# Patient Record
Sex: Female | Born: 1935
Health system: Southern US, Community
[De-identification: ages and names within clinical notes are randomized; demographics above are authoritative.]

## PROBLEM LIST (undated history)

## (undated) DIAGNOSIS — R9431 Abnormal electrocardiogram [ECG] [EKG]: Secondary | ICD-10-CM

## (undated) DIAGNOSIS — H269 Unspecified cataract: Secondary | ICD-10-CM

## (undated) DIAGNOSIS — R413 Other amnesia: Secondary | ICD-10-CM

## (undated) DIAGNOSIS — E039 Hypothyroidism, unspecified: Secondary | ICD-10-CM

## (undated) DIAGNOSIS — I10 Essential (primary) hypertension: Secondary | ICD-10-CM

## (undated) DIAGNOSIS — R5383 Other fatigue: Secondary | ICD-10-CM

## (undated) DIAGNOSIS — S52021A Displaced fracture of olecranon process without intraarticular extension of right ulna, initial encounter for closed fracture: Secondary | ICD-10-CM

## (undated) DIAGNOSIS — A0472 Enterocolitis due to Clostridium difficile, not specified as recurrent: Secondary | ICD-10-CM

## (undated) DIAGNOSIS — E785 Hyperlipidemia, unspecified: Secondary | ICD-10-CM

## (undated) DIAGNOSIS — J189 Pneumonia, unspecified organism: Secondary | ICD-10-CM

## (undated) DIAGNOSIS — F419 Anxiety disorder, unspecified: Secondary | ICD-10-CM

## (undated) HISTORY — DX: Other fatigue: R53.83

## (undated) HISTORY — DX: Enterocolitis due to Clostridium difficile, not specified as recurrent: A04.72

## (undated) HISTORY — DX: Hypothyroidism, unspecified: E03.9

## (undated) HISTORY — PX: PARATHYROIDECTOMY: SHX19

## (undated) HISTORY — DX: Essential (primary) hypertension: I10

## (undated) HISTORY — DX: Abnormal electrocardiogram (ECG) (EKG): R94.31

## (undated) HISTORY — PX: TONSILLECTOMY: SUR1361

## (undated) HISTORY — PX: THYROIDECTOMY, PARTIAL: SHX18

## (undated) HISTORY — PX: BREAST SURGERY: SHX581

## (undated) HISTORY — DX: Pneumonia, unspecified organism: J18.9

## (undated) HISTORY — DX: Anxiety disorder, unspecified: F41.9

## (undated) HISTORY — DX: Unspecified cataract: H26.9

## (undated) HISTORY — DX: Hyperlipidemia, unspecified: E78.5

## (undated) HISTORY — PX: COLONOSCOPY: SHX174

---

## 1998-01-28 ENCOUNTER — Other Ambulatory Visit: Admission: RE | Admit: 1998-01-28 | Discharge: 1998-01-28 | Payer: Self-pay | Admitting: Obstetrics and Gynecology

## 1999-03-05 ENCOUNTER — Other Ambulatory Visit: Admission: RE | Admit: 1999-03-05 | Discharge: 1999-03-05 | Payer: Self-pay | Admitting: Obstetrics and Gynecology

## 1999-07-22 ENCOUNTER — Ambulatory Visit (HOSPITAL_BASED_OUTPATIENT_CLINIC_OR_DEPARTMENT_OTHER): Admission: RE | Admit: 1999-07-22 | Discharge: 1999-07-22 | Payer: Self-pay

## 1999-07-22 ENCOUNTER — Encounter (INDEPENDENT_AMBULATORY_CARE_PROVIDER_SITE_OTHER): Payer: Self-pay | Admitting: *Deleted

## 2000-03-21 ENCOUNTER — Other Ambulatory Visit: Admission: RE | Admit: 2000-03-21 | Discharge: 2000-03-21 | Payer: Self-pay | Admitting: Obstetrics and Gynecology

## 2001-03-26 ENCOUNTER — Other Ambulatory Visit: Admission: RE | Admit: 2001-03-26 | Discharge: 2001-03-26 | Payer: Self-pay | Admitting: Obstetrics and Gynecology

## 2001-08-10 ENCOUNTER — Encounter: Payer: Self-pay | Admitting: Orthopedic Surgery

## 2001-08-10 ENCOUNTER — Emergency Department (HOSPITAL_COMMUNITY): Admission: EM | Admit: 2001-08-10 | Discharge: 2001-08-10 | Payer: Self-pay | Admitting: *Deleted

## 2002-04-03 ENCOUNTER — Other Ambulatory Visit: Admission: RE | Admit: 2002-04-03 | Discharge: 2002-04-03 | Payer: Self-pay | Admitting: Obstetrics and Gynecology

## 2003-03-14 ENCOUNTER — Other Ambulatory Visit: Admission: RE | Admit: 2003-03-14 | Discharge: 2003-03-14 | Payer: Self-pay | Admitting: Obstetrics and Gynecology

## 2007-01-09 ENCOUNTER — Encounter: Admission: RE | Admit: 2007-01-09 | Discharge: 2007-01-09 | Payer: Self-pay | Admitting: Emergency Medicine

## 2007-01-30 ENCOUNTER — Ambulatory Visit (HOSPITAL_COMMUNITY): Admission: RE | Admit: 2007-01-30 | Discharge: 2007-01-31 | Payer: Self-pay | Admitting: General Surgery

## 2007-01-30 ENCOUNTER — Encounter (INDEPENDENT_AMBULATORY_CARE_PROVIDER_SITE_OTHER): Payer: Self-pay | Admitting: General Surgery

## 2009-01-29 ENCOUNTER — Encounter: Admission: RE | Admit: 2009-01-29 | Discharge: 2009-01-29 | Payer: Self-pay | Admitting: Emergency Medicine

## 2010-03-20 ENCOUNTER — Ambulatory Visit (HOSPITAL_COMMUNITY): Admission: RE | Admit: 2010-03-20 | Discharge: 2010-03-20 | Payer: Self-pay | Admitting: Emergency Medicine

## 2010-10-18 ENCOUNTER — Other Ambulatory Visit: Payer: Self-pay | Admitting: Radiology

## 2010-12-14 NOTE — Op Note (Signed)
NAME:  CHRISTIANNA, BELMONTE NO.:  [id number]   MEDICAL RECORD NO.:  [id number]          PATIENT TYPE:  AMB   LOCATION:  DAY                          FACILITY:  [facility]   PHYSICIAN:  Anselm Pancoast. Weatherly, M.D.DATE OF BIRTH:  March 09, 1924   DATE OF PROCEDURE:  01/30/2007  DATE OF DISCHARGE:                               OPERATIVE REPORT   PREOPERATIVE DIAGNOSIS:  Elevated calcium, primary hyperparathyroidism,  probably left inferior gland.   POSTOPERATIVE DIAGNOSIS:  Elevated calcium, primary hyperparathyroidism,  probably left inferior gland.   OPERATION:  Excision of parathyroid adenoma, left inferior.   ANESTHESIA:  General anesthesia.   SURGEON:  Anselm Pancoast. Zachery Dakins, M.D.   ASSISTANT:  Sandria Bales. Ezzard Standing, M.D.   HISTORY:  Karely Hurtado is an 77 year old female who was referred to  me through the courtesy of Dr. Cleta Alberts, Urgent Care, for an elevated serum  calcium that had been noted several months ago. The patient had had a  previous left thyroid lobectomy for benign disease by Dr. Mosetta Anis  about 20 years ago, and when the calcium was noted to be elevated, the  patient had a repeat serum calcium and her calcium has ranged from about  10.8 to about 11.3, and they then measured a parathyroid hormone level  and it was elevated at a range of approximately 105.  The patient had  had a brain CT and then an MRI because of forgetfulness and not mentally  as sharp as what she considered normal.  She had also been started on  Xanax several months earlier.  She then had a sestamibi scan and on the  sestamibi scan, showed an asymmetrical uptake, predominantly on the  right side but then thought that the left inferior parathyroid was  probably the adenoma.  When I reviewed the report, I could not follow  their logic of why they thought everything was hyperactive on the right  and then switched to the left, but I reviewed the scans with the  radiologist. They were not aware  that she had had a previous left  thyroid lobectomy and that is they saw such asymmetry in the uptake  initially, in that she did not have the left thyroid and then the area  that persists, it appears the most active is the left inferior.  So I  discussed with the patient our plan on removing the adenoma, we would  make a small incision and first try to identify the left inferior, if  this was adenoma, we would stop our surgery at that point, if it was not  an adenoma, we would explore the neck further and hopefully not have to  do a bilateral exploration.  The patient states that she is allergic to  penicillin, did not want to take the Ancef, and I did not give her any  preoperative antibiotics.   DESCRIPTION OF PROCEDURE:  She was positioned on the OR table, induction  of general anesthesia, endotracheal tube, a roll was placed under the  back, and then the neck was prepped Betadine solution and draped in a  sterile manner.  The patient's  little incision was noted and I opened  the left side of the little previous thyroid incision made by Dr. Orpah Greek  20 years earlier, and went straight down through the thin layer of  platysma.  The strap muscles were elevated laterally and we could feel  the trachea midline.  The little area to the left of the trachea was  carefully dissected and it appeared that we were actually a little lower  than, since you could not see manubrium on the scans, and then went  slightly up, identified the recurrent laryngeal nerve on the left, and  then followed this and then found definitely a little adenoma that was  carefully separated from the surrounding tissues and the little adenoma,  after it was removed, measured approximately 10 x 9 x 5 mm in size,  which is definitely enlarged and was examined pathologically by Dr.  Debby Bud is a definitely a parathyroid adenoma.  There were a couple of  little clips placed on the little blood vessels controlling this  adenoma  and then, after removing it, reinspected the nerve, both inferior and  just close to where it was in proximity to the adenoma.  We closed the  strap muscles with a couple sutures of 4-0 Vicryl and then the  subcuticular was closed with 4-0 Monocryl and three 1/2 inch Steri-  Strips on the skin incision.  The patient tolerated the procedure nicely  and was extubated and sent to the recovery room in stable postop  condition.  I will leave it to the patient whether she spends the night  or possibly goes home later this evening.  Her family says that she  would like to try to get off the Xanax and I recommend that if she wants  to take taper that to cut everything by half and Dr. Cleta Alberts will actually  follow her and completely control it.  He is also managing her elevated  blood pressure and her general medical problems.           ______________________________  Anselm Pancoast. Zachery Dakins, M.D.     WJW/MEDQ  D:  01/30/2007  T:  01/30/2007  Job:  [id number]   cc:   Brett Canales A. Cleta Alberts, M.D.  Fax: [phone]

## 2011-02-03 ENCOUNTER — Encounter: Payer: Self-pay | Admitting: Cardiology

## 2011-02-09 ENCOUNTER — Encounter: Payer: Self-pay | Admitting: Cardiology

## 2011-02-10 ENCOUNTER — Ambulatory Visit (INDEPENDENT_AMBULATORY_CARE_PROVIDER_SITE_OTHER): Payer: Medicare Other | Admitting: Cardiology

## 2011-02-10 ENCOUNTER — Encounter: Payer: Self-pay | Admitting: Cardiology

## 2011-02-10 ENCOUNTER — Ambulatory Visit: Payer: Self-pay | Admitting: Cardiology

## 2011-02-10 DIAGNOSIS — R5381 Other malaise: Secondary | ICD-10-CM

## 2011-02-10 DIAGNOSIS — I10 Essential (primary) hypertension: Secondary | ICD-10-CM

## 2011-02-10 DIAGNOSIS — R9431 Abnormal electrocardiogram [ECG] [EKG]: Secondary | ICD-10-CM

## 2011-02-10 DIAGNOSIS — R5383 Other fatigue: Secondary | ICD-10-CM

## 2011-02-10 NOTE — Assessment & Plan Note (Signed)
She reports that she is only recently been told she had hypertension. This is well controlled and she will continue the meds as listed.

## 2011-02-10 NOTE — Assessment & Plan Note (Signed)
She had some labs drawn by Dr. Cleta Alberts the other day.  I will try to get these to review.  Otherwise I will evaluate this as above.

## 2011-02-10 NOTE — Assessment & Plan Note (Signed)
The EKG could be related to ischemia which may be manifesting as fatigue. I will start with an echocardiogram. If this is normal I will plan a stress perfusion study. She would be able to walk on a treadmill

## 2011-02-10 NOTE — Progress Notes (Signed)
HPI The patient presents for evaluation of an abnormal EKG.  She has no prior cardiac history. She has for the last several months or even longer been getting increasing fatigue. She still able to do household chores though she doesn't exercise routinely. She can vacuum. She will fill more exhausted after doing this. She does not describe chest pressure, neck or arm discomfort. She does not report palpitations, presyncope or syncope. She has not had any new shortness of breath, PND or orthopnea. Senna weight change or edema. She was noted recently to have an EKG with anterior T-wave inversions and premature ectopic complexes no previous.  Allergies  Allergen Reactions  . Penicillins     Current Outpatient Prescriptions  Medication Sig Dispense Refill  . AMLODIPINE BESYLATE PO Take by mouth daily.        Marland Kitchen aspirin 81 MG tablet Take 81 mg by mouth daily.        Marland Kitchen atorvastatin (LIPITOR) 10 MG tablet Take 10 mg by mouth daily.        . Calcium Carbonate-Vitamin D (CALTRATE 600+D PO) Take by mouth daily.        . Cholecalciferol (VITAMIN D) 2000 UNITS CAPS Take by mouth daily.        . clonazePAM (KLONOPIN) 0.5 MG tablet Take 0.5 mg by mouth 2 (two) times daily as needed.        Marland Kitchen levothyroxine (SYNTHROID, LEVOTHROID) 50 MCG tablet Take 50 mcg by mouth daily.        . QUEtiapine (SEROQUEL) 25 MG tablet Take 25 mg by mouth at bedtime.          Past Medical History  Diagnosis Date  . Abnormal EKG   . Fatigue   . Hyperlipidemia   . Hypothyroid   . HTN (hypertension)     New    Past Surgical History  Procedure Date  . Thyroidectomy, partial     Family History  Problem Relation Age of Onset  . Arthritis Mother     History   Social History  . Marital Status: Married    Spouse Name: N/A    Number of Children: N/A  . Years of Education: N/A   Occupational History  . Not on file.   Social History Main Topics  . Smoking status: Former Smoker    Quit date: 02/10/1991  .  Smokeless tobacco: Not on file  . Alcohol Use: Not on file  . Drug Use: Not on file  . Sexually Active: Not on file   Other Topics Concern  . Not on file   Social History Narrative  . No narrative on file    ROS:  Positive for joint pains.  Otherwise as stated in the HPI and negative for all other systems.  PHYSICAL EXAM BP 121/69  Pulse 99  Resp 16  Ht 5\' 3"  (1.6 m)  Wt 137 lb (62.143 kg)  BMI 24.27 kg/m2 GENERAL:  Well appearing HEENT:  Pupils equal round and reactive, fundi not visualized, oral mucosa unremarkable NECK:  No jugular venous distention, waveform within normal limits, carotid upstroke brisk and symmetric, no bruits, no thyromegaly LYMPHATICS:  No cervical, inguinal adenopathy LUNGS:  Clear to auscultation bilaterally BACK:  No CVA tenderness CHEST:  Unremarkable HEART:  PMI not displaced or sustained,S1 and S2 within normal limits, no S3, no S4, no clicks, no rubs, no murmurs ABD:  Flat, positive bowel sounds normal in frequency in pitch, no bruits, no rebound, no guarding, no midline pulsatile  mass, no hepatomegaly, no splenomegaly EXT:  2 plus pulses throughout, no edema, no cyanosis no clubbing SKIN:  No rashes no nodules NEURO:  Cranial nerves II through XII grossly intact, motor grossly intact throughout [facility]:  Cognitively intact, oriented to person place and time   EKG:  02/01/11  Sinus rhythm, rate 78, premature ectopic complexes, axis within normal limits, but within normal limits, anterior T-wave inversions new for previous  ASSESSMENT AND PLAN

## 2011-02-10 NOTE — Patient Instructions (Signed)
Your physician has requested that you have an echocardiogram. Echocardiography is a painless test that uses sound waves to create images of your heart. It provides your doctor with information about the size and shape of your heart and how well your heart's chambers and valves are working. This procedure takes approximately one hour. There are no restrictions for this procedure.  You will be called with the results of this test when they are available.  If this test is normal you will be scheduled for a stress test.  Please continue your current medications as listed.  Follow up will be based on the results of your testing.

## 2011-02-16 ENCOUNTER — Ambulatory Visit (HOSPITAL_COMMUNITY): Payer: Medicare Other | Attending: Cardiology | Admitting: Radiology

## 2011-02-16 DIAGNOSIS — I080 Rheumatic disorders of both mitral and aortic valves: Secondary | ICD-10-CM | POA: Insufficient documentation

## 2011-02-16 DIAGNOSIS — E785 Hyperlipidemia, unspecified: Secondary | ICD-10-CM | POA: Insufficient documentation

## 2011-02-16 DIAGNOSIS — I079 Rheumatic tricuspid valve disease, unspecified: Secondary | ICD-10-CM | POA: Insufficient documentation

## 2011-02-16 DIAGNOSIS — I10 Essential (primary) hypertension: Secondary | ICD-10-CM | POA: Insufficient documentation

## 2011-02-16 DIAGNOSIS — R9431 Abnormal electrocardiogram [ECG] [EKG]: Secondary | ICD-10-CM | POA: Insufficient documentation

## 2011-02-17 ENCOUNTER — Encounter (HOSPITAL_COMMUNITY): Payer: Self-pay | Admitting: Emergency Medicine

## 2011-03-03 ENCOUNTER — Other Ambulatory Visit: Payer: Self-pay | Admitting: *Deleted

## 2011-03-03 ENCOUNTER — Telehealth: Payer: Self-pay | Admitting: Cardiology

## 2011-03-03 DIAGNOSIS — R9431 Abnormal electrocardiogram [ECG] [EKG]: Secondary | ICD-10-CM

## 2011-03-03 NOTE — Progress Notes (Unsigned)
Pt ready to schedule myoview.  The order was placed and pt will be contacted to schedule.

## 2011-03-03 NOTE — Telephone Encounter (Signed)
Order placed for lexiscan.  Pt will be called and scheduled.

## 2011-03-03 NOTE — Telephone Encounter (Signed)
Pt had an echo and was told she needed a stress test, but was going to the beach, she is back now, just need order for test

## 2011-03-04 ENCOUNTER — Telehealth: Payer: Self-pay | Admitting: Cardiovascular Disease

## 2011-03-04 ENCOUNTER — Telehealth: Payer: Self-pay | Admitting: Cardiology

## 2011-03-04 NOTE — Telephone Encounter (Signed)
Pt's husband calling re order for stress test, didn't get a call back and wanted to follow up

## 2011-03-04 NOTE — Telephone Encounter (Signed)
Pt's husband calling to follow up with order for stress test, didn't receive a call from yesterday

## 2011-03-04 NOTE — Telephone Encounter (Signed)
Pt scheduled for 8/14 at 9:15 am.  Instructions sheet mailed to pts home address.

## 2011-03-15 ENCOUNTER — Ambulatory Visit (HOSPITAL_COMMUNITY): Payer: Medicare Other | Attending: Cardiology | Admitting: Radiology

## 2011-03-15 VITALS — Ht 60.0 in | Wt 137.0 lb

## 2011-03-15 DIAGNOSIS — I4949 Other premature depolarization: Secondary | ICD-10-CM

## 2011-03-15 DIAGNOSIS — R9431 Abnormal electrocardiogram [ECG] [EKG]: Secondary | ICD-10-CM | POA: Insufficient documentation

## 2011-03-15 MED ORDER — TECHNETIUM TC 99M TETROFOSMIN IV KIT
10.7000 | PACK | Freq: Once | INTRAVENOUS | Status: AC | PRN
  Administered 2011-03-15: 11 via INTRAVENOUS

## 2011-03-15 MED ORDER — TECHNETIUM TC 99M TETROFOSMIN IV KIT
33.0000 | PACK | Freq: Once | INTRAVENOUS | Status: AC | PRN
  Administered 2011-03-15: 33 via INTRAVENOUS

## 2011-03-15 MED ORDER — REGADENOSON 0.4 MG/5ML IV SOLN
0.4000 mg | Freq: Once | INTRAVENOUS | Status: AC
Start: 2011-03-15 — End: 2011-03-15
  Administered 2011-03-15: 0.4 mg via INTRAVENOUS

## 2011-03-15 NOTE — Progress Notes (Addendum)
[facility] SITE 3 NUCLEAR MED [address] [place] [address] [phone]  Cardiology Nuclear Med Study  Brianna Reynolds is a 77 y.o. female [id number] Jul 08, 1924   Nuclear Med Background Indication for Stress Test:  Evaluation for Ischemia and Abnormal EKG History:  02/16/11 Echo:EF=55-60%, Mild AR, MR Cardiac Risk Factors: History of Smoking, Hypertension and Lipids  Symptoms:  Fatigue   Nuclear Pre-Procedure Caffeine/Decaff Intake:  None NPO After: 7:00pm   Lungs:  Clear.  O2 sat 98% on RA. IV 0.9% NS with Angio Cath:  20g  IV Site: R Antecubital  IV Started by:  Stanton Kidney, EMT-P  Chest Size (in):  36 Cup Size: C  Height: 5' (1.524 m)  Weight:  137 lb (62.143 kg)  BMI:  Body mass index is 26.76 kg/(m^2). Tech Comments:  NA    Nuclear Med Study 1 or 2 day study: 1 day  Stress Test Type:  Lexiscan  Reading MD: Charlton Haws, MD  Order Authorizing Provider:  Rollene Rotunda, MD  Resting Radionuclide: Technetium 62m Tetrofosmin  Resting Radionuclide Dose: 10.7 mCi   Stress Radionuclide:  Technetium 15m Tetrofosmin  Stress Radionuclide Dose: 33.0 mCi           Stress Protocol Rest HR: 75 Stress HR: 98  Rest BP: 108/58 Stress BP: 107/60  Exercise Time (min): n/a METS: n/a   Predicted Max HR: 136 bpm % Max HR: 72.06 bpm Rate Pressure Product: 41324   Dose of Adenosine (mg):  n/a Dose of Lexiscan: 0.4 mg  Dose of Atropine (mg): n/a Dose of Dobutamine: n/a mcg/kg/min (at max HR)  Stress Test Technologist: Smiley Houseman, CMA-N  Nuclear Technologist:  Domenic Polite, CNMT     Rest Procedure:  Myocardial perfusion imaging was performed at rest 45 minutes following the intravenous administration of Technetium 72m Tetrofosmin.  Rest ECG: Nonspecific T-wave changes with intermittent LBBB beats.  Stress Procedure:  The patient received IV Lexiscan 0.4 mg over 15-seconds.  Technetium 33m Tetrofosmin injected at 30-seconds.  Patient developed LBBB  with Lexiscan.  Quantitative spect images were obtained after a 45 minute delay.  Stress ECG: No significant change from baseline ECG  QPS Raw Data Images:  Normal; no motion artifact; normal heart/lung ratio. Stress Images:  There is decreased uptake in the anterior wall. Rest Images:  Normal homogeneous uptake in all areas of the myocardium. Subtraction (SDS):  These findings are consistent with ischemia. Transient Ischemic Dilatation (Normal <1.22):  0.96 Lung/Heart Ratio (Normal <0.45):  0.33  Quantitative Gated Spect Images QGS EDV:  68 ml QGS ESV:  29 ml QGS cine images:  NL LV Function; NL Wall Motion QGS EF: 58%  Impression Exercise Capacity:  Lexiscan with no exercise. BP Response:  Normal blood pressure response. Clinical Symptoms:  No chest pain. ECG Impression:  No significant ST segment change suggestive of ischemia. Comparison with Prior Nuclear Study: No previous nuclear study performed  Overall Impression:  Small area of anterior wall ischemia at mid and apical level  Charlton Haws  Discussed with the patient.  Abnormal nuclear.  She will need heart catheterization in the [facility] lab.  We will call to arrange.  Rollene Rotunda

## 2011-03-16 NOTE — Progress Notes (Signed)
nuc med report routed to Dr. Antoine Poche 8/15//12 Brianna Reynolds

## 2011-03-18 ENCOUNTER — Telehealth: Payer: Self-pay | Admitting: *Deleted

## 2011-03-18 ENCOUNTER — Other Ambulatory Visit: Payer: Self-pay | Admitting: *Deleted

## 2011-03-18 ENCOUNTER — Encounter: Payer: Self-pay | Admitting: *Deleted

## 2011-03-18 DIAGNOSIS — Z01810 Encounter for preprocedural cardiovascular examination: Secondary | ICD-10-CM

## 2011-03-18 DIAGNOSIS — I2510 Atherosclerotic heart disease of native coronary artery without angina pectoris: Secondary | ICD-10-CM

## 2011-03-18 NOTE — Progress Notes (Signed)
Pt aware of results and need for cath.  Scheduled for 03/24/2011.  Pt will come in for labs and instructions next week.

## 2011-03-18 NOTE — Telephone Encounter (Signed)
Spoke with pt about her cardiac cath scheduled for Thursday March 24, 2011 in the [facility] lab with Dr Antoine Poche at 11:30.  Pt aware is needs to come into the office to find blood work and to pick up a copy of her instructions and directions.  Instruction were reviewed with the pt on the phone,

## 2011-03-23 ENCOUNTER — Other Ambulatory Visit (INDEPENDENT_AMBULATORY_CARE_PROVIDER_SITE_OTHER): Payer: Medicare Other | Admitting: *Deleted

## 2011-03-23 DIAGNOSIS — Z01810 Encounter for preprocedural cardiovascular examination: Secondary | ICD-10-CM

## 2011-03-23 DIAGNOSIS — I2510 Atherosclerotic heart disease of native coronary artery without angina pectoris: Secondary | ICD-10-CM

## 2011-03-23 LAB — CBC WITH DIFFERENTIAL/PLATELET
Basophils Relative: 0.6 % (ref 0.0–3.0)
Hemoglobin: 14.1 g/dL (ref 12.0–15.0)
Lymphocytes Relative: 19.9 % (ref 12.0–46.0)
Monocytes Relative: 9.2 % (ref 3.0–12.0)
Neutro Abs: 5.7 10*3/uL (ref 1.4–7.7)
RBC: 4.77 Mil/uL (ref 3.87–5.11)

## 2011-03-23 LAB — PROTIME-INR
INR: 1 ratio (ref 0.8–1.0)
Prothrombin Time: 11.6 s (ref 10.2–12.4)

## 2011-03-23 LAB — BASIC METABOLIC PANEL
Chloride: 107 mEq/L (ref 96–112)
Potassium: 3.8 mEq/L (ref 3.5–5.1)

## 2011-03-24 ENCOUNTER — Inpatient Hospital Stay (HOSPITAL_BASED_OUTPATIENT_CLINIC_OR_DEPARTMENT_OTHER)
Admission: RE | Admit: 2011-03-24 | Discharge: 2011-03-24 | Disposition: A | Discharge status: Home / Self Care | Payer: Medicare Other | Source: Ambulatory Visit | Attending: Cardiology | Admitting: Cardiology

## 2011-03-24 DIAGNOSIS — R9431 Abnormal electrocardiogram [ECG] [EKG]: Secondary | ICD-10-CM | POA: Insufficient documentation

## 2011-03-24 DIAGNOSIS — R5381 Other malaise: Secondary | ICD-10-CM | POA: Insufficient documentation

## 2011-03-24 DIAGNOSIS — R9430 Abnormal result of cardiovascular function study, unspecified: Secondary | ICD-10-CM

## 2011-04-01 ENCOUNTER — Encounter: Payer: Self-pay | Admitting: Cardiology

## 2011-05-12 NOTE — Cardiovascular Report (Signed)
  NAME:  Brianna Reynolds, Brianna Reynolds NO.:  [id number]  MEDICAL RECORD NO.:  [id number]  LOCATION:  [id number]                     FACILITY:  [facility]  PHYSICIAN:  Rollene Rotunda, MD, FACCDATE OF BIRTH:  1923-12-24  DATE OF PROCEDURE:  03/24/2011 DATE OF DISCHARGE:  03/15/2011                           CARDIAC CATHETERIZATION   PRIMARY CARE PHYSICIAN:  Brett Canales A. Cleta Alberts, MD  PROCEDURE:  Left heart catheterization/coronary arteriography.  INDICATIONS:  The patient with fatigue, abnormal EKG, and an abnormal stress test suggesting anterior ischemia.  PROCEDURE NOTE:  Left heart catheterization was performed via the right femoral artery.  The artery was cannulated using anterior wall puncture. A #4-French arterial sheath was inserted via the modified Seldinger technique.  A preformed Judkins and a pigtail catheter were utilized. The patient tolerated the procedure well and left the lab in stable condition.  RESULTS:  Hemodynamics:  LV 143/13, AO 143/93.  Coronaries:  Left mainwas normal.  The LAD had diffuse luminal irregularities.  There was mid 30% stenosis.  First diagonal was large and normal.  The circumflex in the AV groove was normal.  Mid obtuse marginal was large and normal. The right coronary artery was large and dominant and normal.  There was long proximal 25% stenosis.  The PDA was moderate-sized and normal. Posterolateral was moderate-sized and normal.  Left ventriculogram: Left ventriculogram was obtained in the RAO projection.  The EF was 60% and normal wall motion.  CONCLUSION:  Minimal coronary plaque.  Normal left ventricular function.  PLAN:  No further cardiac workup is suggested.  The patient will follow with Dr. Cleta Alberts.     Rollene Rotunda, MD, [facility]     JH/MEDQ  D:  03/24/2011  T:  03/24/2011  Job:  [id number]  cc:   Brett Canales A. Cleta Alberts, M.D.  Electronically Signed by Rollene Rotunda MD [facility] on 05/12/2011 01:28:06 PM

## 2011-05-17 LAB — COMPREHENSIVE METABOLIC PANEL
ALT: 9
AST: 16
CO2: 26
Calcium: 10.8 — ABNORMAL HIGH
GFR calc Af Amer: >60
GFR calc non Af Amer: >60
Potassium: 4
Sodium: 141
Total Protein: 7

## 2011-05-17 LAB — DIFFERENTIAL
Eosinophils Absolute: 0
Eosinophils Relative: 0
Lymphs Abs: 1.2
Monocytes Relative: 6

## 2011-05-17 LAB — CBC
MCHC: 33.8
RBC: 4.88
WBC: 9.7

## 2011-08-07 ENCOUNTER — Ambulatory Visit (INDEPENDENT_AMBULATORY_CARE_PROVIDER_SITE_OTHER): Payer: Medicare Other

## 2011-08-07 DIAGNOSIS — E7800 Pure hypercholesterolemia, unspecified: Secondary | ICD-10-CM | POA: Diagnosis not present

## 2011-08-07 DIAGNOSIS — G4700 Insomnia, unspecified: Secondary | ICD-10-CM | POA: Diagnosis not present

## 2011-08-07 DIAGNOSIS — Z79899 Other long term (current) drug therapy: Secondary | ICD-10-CM | POA: Diagnosis not present

## 2011-08-07 DIAGNOSIS — E782 Mixed hyperlipidemia: Secondary | ICD-10-CM | POA: Diagnosis not present

## 2011-08-12 DIAGNOSIS — H43819 Vitreous degeneration, unspecified eye: Secondary | ICD-10-CM | POA: Diagnosis not present

## 2011-08-12 DIAGNOSIS — Z961 Presence of intraocular lens: Secondary | ICD-10-CM | POA: Diagnosis not present

## 2011-08-13 ENCOUNTER — Ambulatory Visit (INDEPENDENT_AMBULATORY_CARE_PROVIDER_SITE_OTHER): Payer: Medicare Other

## 2011-08-13 DIAGNOSIS — G4700 Insomnia, unspecified: Secondary | ICD-10-CM

## 2011-08-13 DIAGNOSIS — F411 Generalized anxiety disorder: Secondary | ICD-10-CM | POA: Diagnosis not present

## 2011-09-21 DIAGNOSIS — Z961 Presence of intraocular lens: Secondary | ICD-10-CM | POA: Diagnosis not present

## 2011-09-21 DIAGNOSIS — G609 Hereditary and idiopathic neuropathy, unspecified: Secondary | ICD-10-CM | POA: Diagnosis not present

## 2011-09-21 DIAGNOSIS — H01009 Unspecified blepharitis unspecified eye, unspecified eyelid: Secondary | ICD-10-CM | POA: Diagnosis not present

## 2011-09-21 DIAGNOSIS — R262 Difficulty in walking, not elsewhere classified: Secondary | ICD-10-CM | POA: Diagnosis not present

## 2011-09-21 DIAGNOSIS — F329 Major depressive disorder, single episode, unspecified: Secondary | ICD-10-CM | POA: Diagnosis not present

## 2011-09-21 DIAGNOSIS — R413 Other amnesia: Secondary | ICD-10-CM | POA: Diagnosis not present

## 2011-09-28 ENCOUNTER — Other Ambulatory Visit: Payer: Self-pay

## 2011-09-28 MED ORDER — QUETIAPINE FUMARATE 25 MG PO TABS: 25.0000 mg | ORAL_TABLET | Freq: Two times a day (BID) | ORAL | Status: DC

## 2011-10-03 DIAGNOSIS — R928 Other abnormal and inconclusive findings on diagnostic imaging of breast: Secondary | ICD-10-CM | POA: Diagnosis not present

## 2011-10-03 DIAGNOSIS — Z09 Encounter for follow-up examination after completed treatment for conditions other than malignant neoplasm: Secondary | ICD-10-CM | POA: Diagnosis not present

## 2011-10-21 ENCOUNTER — Other Ambulatory Visit: Payer: Self-pay | Admitting: Emergency Medicine

## 2011-10-22 NOTE — Telephone Encounter (Signed)
HEATHER FROM [name] STATES THAT THE PT'S HUSBAND IS BEING PUSHY ABOUT THE RX FOR KLONOPIN BEING REFILLED, AND JUST WANTED TO MAKE SURE THAT WE REFILLED IT AS SOON AS POSSIBLE.

## 2011-11-07 DIAGNOSIS — H35369 Drusen (degenerative) of macula, unspecified eye: Secondary | ICD-10-CM | POA: Diagnosis not present

## 2011-11-07 DIAGNOSIS — H04129 Dry eye syndrome of unspecified lacrimal gland: Secondary | ICD-10-CM | POA: Diagnosis not present

## 2011-11-07 DIAGNOSIS — Z961 Presence of intraocular lens: Secondary | ICD-10-CM | POA: Diagnosis not present

## 2011-11-07 DIAGNOSIS — H5310 Unspecified subjective visual disturbances: Secondary | ICD-10-CM | POA: Diagnosis not present

## 2011-11-15 ENCOUNTER — Telehealth: Payer: Self-pay

## 2011-11-15 ENCOUNTER — Other Ambulatory Visit: Payer: Self-pay | Admitting: Physician Assistant

## 2011-11-15 ENCOUNTER — Other Ambulatory Visit: Payer: Self-pay | Admitting: Emergency Medicine

## 2011-11-15 MED ORDER — QUETIAPINE FUMARATE 25 MG PO TABS: ORAL_TABLET | ORAL | Status: DC

## 2011-11-15 MED ORDER — QUETIAPINE FUMARATE 25 MG PO TABS: 25.0000 mg | ORAL_TABLET | Freq: Every day | ORAL | Status: DC

## 2011-11-15 NOTE — Telephone Encounter (Signed)
pts husband IS on her hippa form.

## 2011-11-15 NOTE — Telephone Encounter (Signed)
Pts husband called stating it was important he speak with dr Cleta Alberts today about wife's medication. When asked if it was a dosage question or something different, he stated it was just about one of her medications and needed to speak with dr daub only. Bf  Best: [phone]  bf

## 2011-12-03 ENCOUNTER — Other Ambulatory Visit: Payer: Self-pay | Admitting: Emergency Medicine

## 2012-01-04 ENCOUNTER — Other Ambulatory Visit: Payer: Self-pay | Admitting: Emergency Medicine

## 2012-01-19 ENCOUNTER — Other Ambulatory Visit: Payer: Self-pay | Admitting: Physician Assistant

## 2012-01-20 ENCOUNTER — Other Ambulatory Visit: Payer: Self-pay | Admitting: Emergency Medicine

## 2012-02-07 ENCOUNTER — Other Ambulatory Visit: Payer: Self-pay | Admitting: Physician Assistant

## 2012-02-07 NOTE — Telephone Encounter (Signed)
Needs office visit before runs out 

## 2012-02-27 ENCOUNTER — Ambulatory Visit (INDEPENDENT_AMBULATORY_CARE_PROVIDER_SITE_OTHER): Payer: Medicare Other | Admitting: Internal Medicine

## 2012-02-27 VITALS — BP 108/60 | HR 98 | Temp 98.8°F | Resp 16 | Ht 59.0 in | Wt 144.8 lb

## 2012-02-27 DIAGNOSIS — M791 Myalgia, unspecified site: Secondary | ICD-10-CM

## 2012-02-27 DIAGNOSIS — R82998 Other abnormal findings in urine: Secondary | ICD-10-CM

## 2012-02-27 DIAGNOSIS — IMO0001 Reserved for inherently not codable concepts without codable children: Secondary | ICD-10-CM | POA: Diagnosis not present

## 2012-02-27 DIAGNOSIS — R8281 Pyuria: Secondary | ICD-10-CM

## 2012-02-27 LAB — POCT URINALYSIS DIPSTICK
Bilirubin, UA: NEGATIVE
Glucose, UA: NEGATIVE
Nitrite, UA: NEGATIVE
Spec Grav, UA: 1.025

## 2012-02-27 LAB — POCT UA - MICROSCOPIC ONLY

## 2012-02-27 LAB — POCT CBC
Hemoglobin: 14.9 g/dL (ref 12.2–16.2)
MPV: 9.1 fL (ref 0–99.8)
POC Granulocyte: 7 — AB (ref 2–6.9)
POC MID %: 6.9 %M (ref 0–12)
RBC: 5.15 M/uL (ref 4.04–5.48)

## 2012-02-27 MED ORDER — AMLODIPINE BESYLATE 5 MG PO TABS: 5.0000 mg | ORAL_TABLET | Freq: Every day | ORAL | Status: DC

## 2012-02-27 MED ORDER — CLONAZEPAM 0.5 MG PO TABS: ORAL_TABLET | ORAL | Status: DC

## 2012-02-27 NOTE — Progress Notes (Signed)
Subjective:    Patient ID: Brianna Reynolds, female    DOB: [birth year], 78 y.o.   MRN: [id number]  HPIComplaining of feeling terrible since she woke up today Shaky all over/jumpy inside This is not her typical anxiety and this feels like no medical problems she's ever had before Denies medical symptoms Worried because going to [employer] on Friday hike and doesn't want to be sick Current meds include Seroquel-25 mg-she takes 2 when necessary insomnia She took 2 last night and when they had worked by 4 a.m. She took 2 more  Needs some medication refills she's here= Norvasc for hypertension, Klonopin for anxiety  Review of Systems  Constitutional: Negative for fever, chills, activity change, appetite change and fatigue.  HENT: Negative for congestion, sore throat, trouble swallowing, neck pain and sinus pressure.   Eyes: Negative for photophobia and visual disturbance.  Respiratory: Negative for cough and shortness of breath.   Cardiovascular: Negative for chest pain, palpitations and leg swelling.  Gastrointestinal: Negative for nausea, vomiting, abdominal pain and constipation.  Genitourinary: Negative for frequency and difficulty urinating.  Musculoskeletal: Positive for myalgias. Negative for gait problem.       This is more of an aching feeling but is  nonspecific and not in just a single muscle group  Skin: Negative for rash.       History negative for tic bites  Neurological: Negative for speech difficulty and headaches.  Hematological: Does not bruise/bleed easily.  Psychiatric/Behavioral: Negative for confusion, dysphoric mood and agitation. The patient is not nervous/anxious.        Objective:   Physical Exam Filed Vitals:   02/27/12 1542  BP: 108/60  Pulse: 98  Temp: 98.8 F (37.1 C)  Resp: 16   HEENT clear Heart regular without murmur, rate 80 Lungs clear  abdomen supple Extremities no edema Neurological intact Skin clear Neuromuscular bones and  joints clear        Results for orders placed in visit on 02/27/12  POCT CBC      Component Value Range   WBC 10.1  4.6 - 10.2 K/uL   Lymph, poc 2.4  0.6 - 3.4   POC LYMPH PERCENT 23.6  10 - 50 %L   MID (cbc) 0.7  0 - 0.9   POC MID % 6.9  0 - 12 %M   POC Granulocyte 7.0 (*) 2 - 6.9   Granulocyte percent 69.5  37 - 80 %G   RBC 5.15  4.04 - 5.48 M/uL   Hemoglobin 14.9  12.2 - 16.2 g/dL   HCT, POC 40.9 (*) 81.1 - 47.9 %   MCV 93.7  80 - 97 fL   MCH, POC 28.9  27 - 31.2 pg   MCHC 30.8 (*) 31.8 - 35.4 g/dL   RDW, POC 91.4     Platelet Count, POC 335  142 - 424 K/uL   MPV 9.1  0 - 99.8 fL  POCT UA - MICROSCOPIC ONLY      Component Value Range   WBC, Ur, HPF, POC 3-6     RBC, urine, microscopic neg     Bacteria, U Microscopic trace     Mucus, UA trace     Epithelial cells, urine per micros 1-3     Crystals, Ur, HPF, POC neg     Casts, Ur, LPF, POC neg     Yeast, UA neg    POCT URINALYSIS DIPSTICK      Component Value Range   Color,  UA yellow     Clarity, UA clear     Glucose, UA neg     Bilirubin, UA neg     Ketones, UA 15mg      Spec Grav, UA 1.025     Blood, UA moderate     pH, UA 5.0     Protein, UA neg     Urobilinogen, UA 0.2     Nitrite, UA neg     Leukocytes, UA Trace      Assessment & Plan:  Problem #1 nonspecific symptoms of feeling bad,? Myalgias,? Shaky are thought secondary to a double dose of Seroquel Problem #2 pyuria-check culture Plan-reassured Check TSH Check metabolic profile Check sedimentation rate  Refill Norvasc and Klonopin

## 2012-02-28 LAB — COMPREHENSIVE METABOLIC PANEL
AST: 19 U/L (ref 0–37)
Albumin: 4.5 g/dL (ref 3.5–5.2)
BUN: 12 mg/dL (ref 6–23)
Calcium: 10.6 mg/dL — ABNORMAL HIGH (ref 8.4–10.5)
Chloride: 108 mEq/L (ref 96–112)
Glucose, Bld: 99 mg/dL (ref 70–99)
Potassium: 4.7 mEq/L (ref 3.5–5.3)
Total Protein: 7.1 g/dL (ref 6.0–8.3)

## 2012-02-29 LAB — URINE CULTURE: Colony Count: 65000

## 2012-03-14 ENCOUNTER — Ambulatory Visit (INDEPENDENT_AMBULATORY_CARE_PROVIDER_SITE_OTHER): Payer: Medicare Other | Admitting: Family Medicine

## 2012-03-14 ENCOUNTER — Ambulatory Visit: Payer: Medicare Other

## 2012-03-14 ENCOUNTER — Emergency Department (HOSPITAL_COMMUNITY)
Admission: EM | Admit: 2012-03-14 | Discharge: 2012-03-14 | Disposition: A | Discharge status: Home / Self Care | Payer: Medicare Other | Attending: Emergency Medicine | Admitting: Emergency Medicine

## 2012-03-14 ENCOUNTER — Encounter (HOSPITAL_COMMUNITY): Payer: Self-pay | Admitting: *Deleted

## 2012-03-14 VITALS — BP 118/76 | HR 87 | Temp 98.3°F | Resp 16 | Ht 59.0 in | Wt 141.0 lb

## 2012-03-14 DIAGNOSIS — Z79899 Other long term (current) drug therapy: Secondary | ICD-10-CM | POA: Insufficient documentation

## 2012-03-14 DIAGNOSIS — R5383 Other fatigue: Secondary | ICD-10-CM

## 2012-03-14 DIAGNOSIS — R8271 Bacteriuria: Secondary | ICD-10-CM

## 2012-03-14 DIAGNOSIS — N390 Urinary tract infection, site not specified: Secondary | ICD-10-CM | POA: Diagnosis not present

## 2012-03-14 DIAGNOSIS — R82998 Other abnormal findings in urine: Secondary | ICD-10-CM | POA: Diagnosis not present

## 2012-03-14 DIAGNOSIS — M79609 Pain in unspecified limb: Secondary | ICD-10-CM

## 2012-03-14 DIAGNOSIS — E039 Hypothyroidism, unspecified: Secondary | ICD-10-CM | POA: Diagnosis not present

## 2012-03-14 DIAGNOSIS — M79606 Pain in leg, unspecified: Secondary | ICD-10-CM

## 2012-03-14 DIAGNOSIS — I10 Essential (primary) hypertension: Secondary | ICD-10-CM | POA: Diagnosis not present

## 2012-03-14 DIAGNOSIS — E213 Hyperparathyroidism, unspecified: Secondary | ICD-10-CM

## 2012-03-14 DIAGNOSIS — R5381 Other malaise: Secondary | ICD-10-CM | POA: Diagnosis not present

## 2012-03-14 LAB — POCT URINALYSIS DIPSTICK
Nitrite, UA: NEGATIVE
Spec Grav, UA: 1.02
Urobilinogen, UA: 0.2
pH, UA: 5

## 2012-03-14 LAB — POCT CBC
HCT, POC: 49.2 % — AB (ref 37.7–47.9)
Lymph, poc: 2.4 (ref 0.6–3.4)
MCH, POC: 28.6 pg (ref 27–31.2)
MCHC: 30.5 g/dL — AB (ref 31.8–35.4)
MCV: 93.9 fL (ref 80–97)
POC Granulocyte: 8.5 — AB (ref 2–6.9)
POC LYMPH PERCENT: 20.8 %L (ref 10–50)
RDW, POC: 14.9 %
WBC: 11.6 10*3/uL — AB (ref 4.6–10.2)

## 2012-03-14 LAB — POCT UA - MICROSCOPIC ONLY
Casts, Ur, LPF, POC: NEGATIVE
Crystals, Ur, HPF, POC: NEGATIVE
Yeast, UA: NEGATIVE

## 2012-03-14 MED ORDER — NITROFURANTOIN MONOHYD MACRO 100 MG PO CAPS: 100.0000 mg | ORAL_CAPSULE | Freq: Two times a day (BID) | ORAL | Status: AC

## 2012-03-14 NOTE — Addendum Note (Signed)
Addended by: Morrell Riddle on: 03/14/2012 07:02 PM   Modules accepted: Orders

## 2012-03-14 NOTE — Progress Notes (Signed)
Bilateral:  No evidence of DVT, superficial thrombosis, or Baker's Cyst.   

## 2012-03-14 NOTE — ED Notes (Signed)
Tiffany, PA-C informed pt had CBC completed @ [facility] today, have spoken with them and they are adding on the CMET per Dr. Meredith Staggers.

## 2012-03-14 NOTE — ED Provider Notes (Signed)
History     CSN: [id number]  Arrival date & time 03/14/12  1632   First MD Initiated Contact with Patient 03/14/12 1714      Chief Complaint  Patient presents with  . Leg Pain    (Consider location/radiation/quality/duration/timing/severity/associated sxs/prior treatment) HPI  Pt sent to the ER by her PCP for dopplers of her lower extremities. Pt has been having achiness in her bilateral legs and flew recently. She told her provider that she has been feeling tired and having achiness, he did a chest xray, d-dimer, urine and basic labs. The d-dimer came back elevated therefore he was concerned about possible DVT. The patient does have a diagnosed today UTI and has been started on abx. Pt denies SOB, CP, fevers. VSS and she is in NAD.  Past Medical History  Diagnosis Date  . Abnormal EKG   . Fatigue   . Hyperlipidemia   . Hypothyroid   . HTN (hypertension)     New    Past Surgical History  Procedure Date  . Thyroidectomy, partial   . Parathyroidectomy     Family History  Problem Relation Age of Onset  . Arthritis Mother     History  Substance Use Topics  . Smoking status: Former Smoker    Quit date: 02/10/1991  . Smokeless tobacco: Not on file  . Alcohol Use: No    OB History    Grav Para Term Preterm Abortions TAB SAB Ect Mult Living                  Review of Systems   HEENT: denies blurry vision or change in hearing PULMONARY: Denies difficulty breathing and SOB CARDIAC: denies chest pain or heart palpitations MUSCULOSKELETAL:  denies being unable to ambulate ABDOMEN AL: denies abdominal pain GU: denies loss of bowel or urinary control NEURO: denies numbness and tingling in extremities SKIN: no new rashes PSYCH: patient denies anxiety or depression. NECK: Pt denies having neck pain     Allergies  Penicillins  Home Medications   Current Outpatient Rx  Name Route Sig Dispense Refill  . AMLODIPINE BESYLATE 5 MG PO TABS Oral Take 1 tablet (5  mg total) by mouth daily. 90 tablet 1  . ASPIRIN EC 81 MG PO TBEC Oral Take 81 mg by mouth daily.    . ATORVASTATIN CALCIUM 10 MG PO TABS Oral Take 10 mg by mouth daily.      Marland Kitchen VITAMIN D 2000 UNITS PO CAPS Oral Take 1 capsule by mouth daily.     Marland Kitchen CLONAZEPAM 0.5 MG PO TABS Oral Take 0.5 mg by mouth 3 (three) times daily as needed. For anxiety.    . OMEGA-3 FATTY ACIDS 1000 MG PO CAPS Oral Take 1 g by mouth daily.     Marland Kitchen LEVOTHYROXINE SODIUM 50 MCG PO TABS Oral Take 50 mcg by mouth daily.    Marland Kitchen MELATONIN 3 MG PO CAPS Oral Take 1 capsule by mouth at bedtime as needed. For sleep.    Marland Kitchen QUETIAPINE FUMARATE 25 MG PO TABS Oral Take 50 mg by mouth at bedtime.    Marland Kitchen NITROFURANTOIN MONOHYD MACRO 100 MG PO CAPS Oral Take 1 capsule (100 mg total) by mouth 2 (two) times daily. 14 capsule 0    BP 144/63  Pulse 86  Temp 97.9 F (36.6 C) (Oral)  Resp 19  SpO2 96%  Physical Exam  Nursing note and vitals reviewed. Constitutional: She appears well-developed and well-nourished. No distress.  HENT:  Head:  Normocephalic and atraumatic.  Eyes: Pupils are equal, round, and reactive to light.  Neck: Normal range of motion. Neck supple.  Cardiovascular: Normal rate and regular rhythm.   Pulmonary/Chest: Effort normal.  Abdominal: Soft.  Musculoskeletal:       Right knee: Normal.       Left knee: Normal.  Neurological: She is alert.  Skin: Skin is warm and dry.    ED Course  Procedures (including critical care time)  Labs Reviewed - No data to display Dg Chest 2 View  03/14/2012  *RADIOLOGY REPORT*  Clinical Data: Body aches  CHEST - 2 VIEW  Comparison: None.  Findings: Lungs are essentially clear.  Possible mild bilateral lower lobe atelectasis. No pleural effusion or pneumothorax.  Cardiomediastinal silhouette is within normal limits.  Degenerative changes of the visualized thoracolumbar spine.  IMPRESSION: No evidence of acute cardiopulmonary disease.  Clinically significant discrepancy from primary  report, if provided: None  Original Report Authenticated By: Charline Bills, M.D.     1. UTI (lower urinary tract infection)       MDM  Dr. Lorenso Courier has seen patient as well.  Dopplers have come back negative, pt already being treated for UTI. She is to follow-up with her PCP.  Pt has been advised of the symptoms that warrant their return to the ED. Patient has voiced understanding and has agreed to follow-up with the PCP or specialist.         Dorthula Matas, PA 03/14/12 2122

## 2012-03-14 NOTE — ED Provider Notes (Signed)
Pt presented for evaluation of leg discomfort.  She had been seen at [facility] earlier today for bilat leg discomfort.  There is no evidence of DVT on the U/S.  Pt appears nontoxic and was noted prior to arrival to have a UTI.  Plan d/c home to f/u as an outpt.  Tobin Chad, MD 03/14/12 2009

## 2012-03-14 NOTE — ED Notes (Signed)
Pt sent from [facility]. Had went there for bil leg "achiness", worse in L leg. No specific calf tenderness. Was sent here due to recent airplane travel, Saturday. Legs painful last few weeks, not feeling well in general for last 2-3 days. Denies n/v/d.

## 2012-03-14 NOTE — ED Notes (Signed)
Pt & family informed that another blood test has been ordered through [facility] to prevent pt from being stuck again; pt & family both appreciative.

## 2012-03-14 NOTE — Progress Notes (Signed)
[facility] [address] [place] [address] [phone]  Date:  03/14/2012   Name:  Brianna Reynolds   DOB:  [birth year]   MRN:  [id number]  PCP:  Lucilla Edin, MD    Chief Complaint: Generalized Body Aches   History of Present Illness:  Brianna Reynolds is a 78 y.o. very pleasant female patient who presents with the following:  "I just feel horrible."  "I cannnot give you any more specifics."  Here with her daughter today for evaluation of "feeling horrible." She does not note any SOB- see O2 sat.   She just flew back from a trip to [place] about one week ago- she was on several planes, her flights were about 2 hours each.  She does not note any leg pain or swelling, but both legs ache.  She has been in twice during this year for similar symptoms.   She has never had a DVT or PE No CP.  It seems that her symptoms have waxed and waned over the last several months.   Former smoker- quit 1992.    History of hyperparathyroidism (s/p removal of she thinks just one gland) a couple of years ago.  See elevated calcium on labs 02/27/12.   Patient Active Problem List  Diagnosis  . Abnormal EKG  . HTN (hypertension)  . Fatigue    Past Medical History  Diagnosis Date  . Abnormal EKG   . Fatigue   . Hyperlipidemia   . Hypothyroid   . HTN (hypertension)     New    Past Surgical History  Procedure Date  . Thyroidectomy, partial     History  Substance Use Topics  . Smoking status: Former Smoker    Quit date: 02/10/1991  . Smokeless tobacco: Not on file  . Alcohol Use: Not on file    Family History  Problem Relation Age of Onset  . Arthritis Mother     Allergies  Allergen Reactions  . Penicillins     Medication list has been reviewed and updated.  Current Outpatient Prescriptions on File Prior to Visit  Medication Sig Dispense Refill  . amLODipine (NORVASC) 5 MG tablet Take 1 tablet (5 mg total) by mouth daily.  90 tablet  1  . aspirin  81 MG tablet Take 81 mg by mouth daily.        Marland Kitchen atorvastatin (LIPITOR) 10 MG tablet Take 10 mg by mouth daily.        . Calcium Carbonate-Vitamin D (CALTRATE 600+D PO) Take by mouth daily.        . Cholecalciferol (VITAMIN D) 2000 UNITS CAPS Take by mouth daily.        . clonazePAM (KLONOPIN) 0.5 MG tablet 2 am, 1 pm prn  90 tablet  3  . fish oil-omega-3 fatty acids 1000 MG capsule Take 2 g by mouth daily.      . Melaton-Thean-Cham-PassF-LBalm (MELATONIN + L-THEANINE PO) Take by mouth.      . QUEtiapine (SEROQUEL) 25 MG tablet Take 2 tablets at bedtime for sleep  60 tablet  11  . SYNTHROID 50 MCG tablet TAKE 1 TABLET EACH DAY.  30 each  1    Review of Systems:  As per HPI- otherwise negative.   Physical Examination: Filed Vitals:   03/14/12 1214  BP: 118/76  Pulse: 87  Temp: 98.3 F (36.8 C)  Resp: 16   Filed Vitals:   03/14/12 1214  Height: 4\' 11"  (  1.499 m)  Weight: 141 lb (63.957 kg)   Body mass index is 28.48 kg/(m^2). Ideal Body Weight: Weight in (lb) to have BMI = 25: 123.5   GEN: WDWN, NAD, Non-toxic, A & O x 3, appears stated age HEENT: Atraumatic, Normocephalic. Neck supple. No masses, No LAD.  TM and oropharynx wnl, PEERL, EOMI Ears and Nose: No external deformity. CV: RRR, No M/G/R. No JVD. No thrill. No extra heart sounds. PULM: CTA B, no wheezes, crackles, rhonchi. No retractions. No resp. distress. No accessory muscle use. ABD: S, NT, ND, +BS. No rebound. No HSM. EXTR: No c/c/e.  No particular calf tenderness or swelling noted.   NEURO Normal gait.  PSYCH: Normally interactive. Conversant. Not depressed or anxious appearing.  Calm demeanor.   [facility] reading (PRIMARY) by  Dr. Patsy Lager.  Lungs are clear, moderate degenerative change in her spine.   CHEST - 2 VIEW  Comparison: None.  Findings: Lungs are essentially clear. Possible mild bilateral lower lobe atelectasis. No pleural effusion or pneumothorax.  Cardiomediastinal silhouette is within normal  limits.  Degenerative changes of the visualized thoracolumbar spine.  IMPRESSION: No evidence of acute cardiopulmonary disease.   Results for orders placed in visit on 03/14/12  POCT CBC      Component Value Range   WBC 11.6 (*) 4.6 - 10.2 K/uL   Lymph, poc 2.4  0.6 - 3.4   POC LYMPH PERCENT 20.8  10 - 50 %L   MID (cbc) 0.6  0 - 0.9   POC MID % 5.5  0 - 12 %M   POC Granulocyte 8.5 (*) 2 - 6.9   Granulocyte percent 73.7  37 - 80 %G   RBC 5.25  4.04 - 5.48 M/uL   Hemoglobin 15.0  12.2 - 16.2 g/dL   HCT, POC 16.1 (*) 09.6 - 47.9 %   MCV 93.9  80 - 97 fL   MCH, POC 28.6  27 - 31.2 pg   MCHC 30.5 (*) 31.8 - 35.4 g/dL   RDW, POC 04.5     Platelet Count, POC 353  142 - 424 K/uL   MPV 9.1  0 - 99.8 fL  GLUCOSE, POCT (MANUAL RESULT ENTRY)      Component Value Range   POC Glucose 86  70 - 99 mg/dl  POCT UA - MICROSCOPIC ONLY      Component Value Range   WBC, Ur, HPF, POC 5-8     RBC, urine, microscopic 2-6     Bacteria, U Microscopic 1+     Mucus, UA small     Epithelial cells, urine per micros 3-7     Crystals, Ur, HPF, POC neg     Casts, Ur, LPF, POC neg     Yeast, UA neg    POCT URINALYSIS DIPSTICK      Component Value Range   Color, UA yellow     Clarity, UA clear     Glucose, UA neg     Bilirubin, UA neg     Ketones, UA trace     Spec Grav, UA 1.020     Blood, UA moderate     pH, UA 5.0     Protein, UA neg     Urobilinogen, UA 0.2     Nitrite, UA neg     Leukocytes, UA small (1+)      Assessment and Plan: 1. Fatigue  POCT CBC, POCT glucose (manual entry), DG Chest 2 View, POCT UA - Microscopic Only, POCT urinalysis  dipstick, nitrofurantoin, macrocrystal-monohydrate, (MACROBID) 100 MG capsule  2. Hyperparathyroidism  PTH, intact and calcium  3. Leg pain  D-dimer, quantitative  4. Bacteria in urine  nitrofurantoin, macrocrystal-monohydrate, (MACROBID) 100 MG capsule, Urine culture   78 year old women with complaint of feeling terrible, no other specifics. Will  start treatment for possible UTI while we await her urine culture.  Also await PTH and calcium- she has a history of hyperparathyroidism in the past and this could be the cause of her symptoms.   Will also do D. Dimer as she has been traveling a lot recently and had a low O2 saturation on one check.  Explained that if her result is elevated she will need to be seen at the ED to determine if she has a blood clot and to start treatment if she has a clot.  She understands this and wishes to proceed.    Received elevated D. Dimer report.  Called her home number and spoke with her husband- they plan to visit [facility] ED as soon as their daughter returns to drive them.  Called ahead to ED and gave brief history to charge nurse- other into is on Epic.    Abbe Amsterdam, MD

## 2012-03-15 LAB — PTH, INTACT AND CALCIUM: Calcium, Total (PTH): 10.6 mg/dL — ABNORMAL HIGH (ref 8.4–10.5)

## 2012-03-15 NOTE — ED Provider Notes (Signed)
Also examined this pt and concur with this evaluation.  Tobin Chad, MD 03/15/12 [phone]

## 2012-03-17 ENCOUNTER — Telehealth: Payer: Self-pay

## 2012-03-17 NOTE — Telephone Encounter (Signed)
Husband, Leonette Most would like a call from Dr. Dallas Schimke re: his wife Brianna Reynolds.  They are not sure "what action to take".  [phone].

## 2012-03-17 NOTE — Telephone Encounter (Signed)
HUSBAND WOULD LIKE A CALL BACK FROM DR COPLAND.  HE STATES THAT MRS Siverling IS STATING THAT SHE "IS JUST NOT FEELING GOOD" AND THAT YOU HAD TALKED WITH HER ABOUT TEST RESULTS YESTERDAY 08/16 PARATHYROID?Marland Kitchen  HE WANTS TO KNOW WHAT IS THE NEXT STEP.

## 2012-03-20 NOTE — Telephone Encounter (Signed)
Called and spoke with husband again today.  I spoke with Dr. Talmage Nap who was kind enough to give me some advice on the phone.  She does not think that hyperparathyroidism is a strong possibility, but did suggest ruling out MM.    Tests that I would like to do include peripheral smear, Vit D, ESR, CRP, SPEP and UPEP.  They plan to bring Brianna Reynolds to the clinic tomorrow so we can get some details as to her history and do any further testing as above.   Will also need to be sure she is UTD on her screening tests such as colonoscopy and mammogram

## 2012-03-21 ENCOUNTER — Ambulatory Visit (INDEPENDENT_AMBULATORY_CARE_PROVIDER_SITE_OTHER): Payer: Medicare Other | Admitting: Family Medicine

## 2012-03-21 VITALS — BP 120/63 | HR 82 | Temp 98.9°F | Resp 18 | Ht 58.25 in | Wt 141.0 lb

## 2012-03-21 DIAGNOSIS — R5383 Other fatigue: Secondary | ICD-10-CM | POA: Diagnosis not present

## 2012-03-21 DIAGNOSIS — E559 Vitamin D deficiency, unspecified: Secondary | ICD-10-CM

## 2012-03-21 DIAGNOSIS — R5381 Other malaise: Secondary | ICD-10-CM

## 2012-03-21 LAB — POCT CBC
Hemoglobin: 14.6 g/dL (ref 12.2–16.2)
MCH, POC: 29.4 pg (ref 27–31.2)
MPV: 9.3 fL (ref 0–99.8)
POC MID %: 5.1 %M (ref 0–12)
RBC: 4.97 M/uL (ref 4.04–5.48)
WBC: 10.9 10*3/uL — AB (ref 4.6–10.2)

## 2012-03-21 LAB — POCT SEDIMENTATION RATE: POCT SED RATE: 42 mm/hr — AB (ref 0–22)

## 2012-03-21 NOTE — Progress Notes (Addendum)
[facility] [address], [place] [address] [phone]  Date:  03/21/2012   Name:  Brianna Reynolds   DOB:  26-Feb-1924   MRN:  [id number]  PCP:  Lucilla Edin, MD    Chief Complaint: Follow-up   History of Present Illness:  Brianna Reynolds is a 78 y.o. very pleasant female patient who presents with the following:  Here again to follow- up "feeling terrible."  Per her daughter these symptoms have come and gone for about 9 months.  Usually feels worst in the morning.  Sometimes will feel better as the day goes on but not always.   No fever.  Has not noted any muscle pain, no joint pain/ stiffness/ swelling. Coughs periodically but nothing regular.   No significant weight change.  Appetite is unchanged.   Of note she was seen by a neurologist for somewhat similar problems in the past, and also for dementia symptoms.  She was started on an SSRI but did not take it for long if at all.   Unsure of date of last colonoscopy.  She did have an abnormal mammogram and a resulting breast biopsy last spring- she is not sure of the results of this biopsy but thinks that everything was ok.  We have requested this result.   Recenlty had a negative cardiac evaluation as well  Patient Active Problem List  Diagnosis  . Abnormal EKG  . HTN (hypertension)  . Fatigue    Past Medical History  Diagnosis Date  . Abnormal EKG   . Fatigue   . Hyperlipidemia   . Hypothyroid   . HTN (hypertension)     New    Past Surgical History  Procedure Date  . Thyroidectomy, partial   . Parathyroidectomy     History  Substance Use Topics  . Smoking status: Former Smoker    Quit date: 02/10/1991  . Smokeless tobacco: Not on file  . Alcohol Use: No    Family History  Problem Relation Age of Onset  . Arthritis Mother     Allergies  Allergen Reactions  . Penicillins     Medication list has been reviewed and updated.  Current Outpatient Prescriptions on File Prior  to Visit  Medication Sig Dispense Refill  . amLODipine (NORVASC) 5 MG tablet Take 1 tablet (5 mg total) by mouth daily.  90 tablet  1  . aspirin EC 81 MG tablet Take 81 mg by mouth daily.      Marland Kitchen atorvastatin (LIPITOR) 10 MG tablet Take 10 mg by mouth daily.        . Cholecalciferol (VITAMIN D) 2000 UNITS CAPS Take 1 capsule by mouth daily.       . clonazePAM (KLONOPIN) 0.5 MG tablet Take 0.5 mg by mouth 3 (three) times daily as needed. For anxiety.      . fish oil-omega-3 fatty acids 1000 MG capsule Take 1 g by mouth daily.       Marland Kitchen levothyroxine (SYNTHROID, LEVOTHROID) 50 MCG tablet Take 50 mcg by mouth daily.      . Melatonin 3 MG CAPS Take 1 capsule by mouth at bedtime as needed. For sleep.      . nitrofurantoin, macrocrystal-monohydrate, (MACROBID) 100 MG capsule Take 1 capsule (100 mg total) by mouth 2 (two) times daily.  14 capsule  0  . QUEtiapine (SEROQUEL) 25 MG tablet Take 50 mg by mouth at bedtime.        Review of Systems:  As per HPI- otherwise negative.   Physical Examination: Filed Vitals:   03/21/12 1241  BP: 120/63  Pulse: 82  Temp: 98.9 F (37.2 C)  Resp: 18   Filed Vitals:   03/21/12 1241  Height: 4' 10.25" (1.48 m)  Weight: 141 lb (63.957 kg)   Body mass index is 29.22 kg/(m^2). Ideal Body Weight: Weight in (lb) to have BMI = 25: 120.4   GEN: WDWN, NAD, Non-toxic, Alert and seems oriented HEENT: Atraumatic, Normocephalic. Neck supple. No masses, No LAD.  TM wnl, oropharynx wnl, PEERL, EOMI Ears and Nose: No external deformity. CV: RRR, No M/G/R. No JVD. No thrill. No extra heart sounds. PULM: CTA B, no wheezes, crackles, rhonchi. No retractions. No resp. distress. No accessory muscle use. ABD: S, NT, ND, +BS. No rebound. No HSM. EXTR: No c/c/e NEURO Normal gait.  PSYCH: Normally interactive. Conversant. Not depressed or anxious appearing.  Calm demeanor.  Breast exam: no abnormality noted Pt stated at least 10 times "why do I feel so bad if there is  nothing wrong with me?  You won't find anything wrong with me."  Question dementia.    Results for orders placed in visit on 03/21/12  POCT SEDIMENTATION RATE      Component Value Range   POCT SED RATE 42 (*) 0 - 22 mm/hr  POCT CBC      Component Value Range   WBC 10.9 (*) 4.6 - 10.2 K/uL   Lymph, poc 1.6  0.6 - 3.4   POC LYMPH PERCENT 14.7  10 - 50 %L   MID (cbc) 0.6  0 - 0.9   POC MID % 5.1  0 - 12 %M   POC Granulocyte 8.7 (*) 2 - 6.9   Granulocyte percent 80.2 (*) 37 - 80 %G   RBC 4.97  4.04 - 5.48 M/uL   Hemoglobin 14.6  12.2 - 16.2 g/dL   HCT, POC 40.9  81.1 - 47.9 %   MCV 92.7  80 - 97 fL   MCH, POC 29.4  27 - 31.2 pg   MCHC 31.7 (*) 31.8 - 35.4 g/dL   RDW, POC 91.4     Platelet Count, POC 322  142 - 424 K/uL   MPV 9.3  0 - 99.8 fL    Assessment and Plan: 1. Malaise  POCT SEDIMENTATION RATE, C-reactive protein, Pathologist smear review, POCT CBC, Serum protein electrophoresis with reflex  2. Unspecified vitamin D deficiency  Vitamin D, 25-hydroxy   Brianna Reynolds is here to further evaluate generalized malaise without any other focal symptoms.  So far the only known abnormality is a slighlty elevated calcium level.  Will evaluate further as above- need to rule- out multiple myeloma.    SPEP pending.  Let me know if any changes while labs are pending.   If all labs turn out to be negative consider treatment with SSRI as this may help.   Reminded to do her yearly mammogram and colonoscopy if it is due.    Brianna Amsterdam, MD  Called today and went over labs. I discussed her spep and IFE results with the reading pathologist- no sign of multiple myeloma at this time. Also her ESR is probably ok for her age.  Called and discussed with Brianna Reynolds and her husband.  Suspect that her symptoms are likely due to depression. She is willing to try an SSRI.  I will call in sertraline to her pharmacy.  Check back with me in about 3 months unless of other  problems.  UTD research and pharmacist at [facility]  did not indicate a significant risk of seratonin syndrome with this combination.

## 2012-03-22 LAB — PATHOLOGIST SMEAR REVIEW

## 2012-03-22 LAB — VITAMIN D 25 HYDROXY (VIT D DEFICIENCY, FRACTURES): Vit D, 25-Hydroxy: 71 ng/mL (ref 30–89)

## 2012-03-23 LAB — PROTEIN ELECTROPHORESIS, SERUM, WITH REFLEX
Alpha-1-Globulin: 5.4 % — ABNORMAL HIGH (ref 2.9–4.9)
Alpha-2-Globulin: 16.4 % — ABNORMAL HIGH (ref 7.1–11.8)
Gamma Globulin: 10.2 % — ABNORMAL LOW (ref 11.1–18.8)

## 2012-03-23 LAB — IGG, IGA, IGM
IgA: 90 mg/dL (ref 69–380)
IgG (Immunoglobin G), Serum: 704 mg/dL (ref 690–1700)
IgM, Serum: 435 mg/dL — ABNORMAL HIGH (ref 52–322)

## 2012-03-23 LAB — IFE INTERPRETATION

## 2012-03-27 MED ORDER — SERTRALINE HCL 50 MG PO TABS: 50.0000 mg | ORAL_TABLET | Freq: Every day | ORAL | Status: DC

## 2012-03-27 NOTE — Addendum Note (Signed)
Addended by: Abbe Amsterdam C on: 03/27/2012 03:48 PM   Modules accepted: Orders

## 2012-03-28 ENCOUNTER — Encounter: Payer: Self-pay | Admitting: Family Medicine

## 2012-03-29 ENCOUNTER — Other Ambulatory Visit: Payer: Self-pay | Admitting: Family Medicine

## 2012-03-29 ENCOUNTER — Other Ambulatory Visit: Payer: Self-pay

## 2012-03-29 MED ORDER — LEVOTHYROXINE SODIUM 50 MCG PO TABS: 50.0000 ug | ORAL_TABLET | Freq: Every day | ORAL | Status: DC

## 2012-03-29 NOTE — Telephone Encounter (Signed)
Ok x 5 months  

## 2012-04-06 ENCOUNTER — Encounter: Payer: Self-pay | Admitting: Emergency Medicine

## 2012-04-06 NOTE — Telephone Encounter (Signed)
Patients husband Leonette Most would like to speak to Dr. Cleta Alberts about patients medication.

## 2012-04-08 ENCOUNTER — Ambulatory Visit (INDEPENDENT_AMBULATORY_CARE_PROVIDER_SITE_OTHER): Payer: Medicare Other | Admitting: Emergency Medicine

## 2012-04-08 VITALS — BP 123/63 | HR 99 | Temp 98.2°F | Resp 16 | Ht 59.5 in | Wt 140.0 lb

## 2012-04-08 DIAGNOSIS — G622 Polyneuropathy due to other toxic agents: Secondary | ICD-10-CM | POA: Diagnosis not present

## 2012-04-08 DIAGNOSIS — R200 Anesthesia of skin: Secondary | ICD-10-CM

## 2012-04-08 DIAGNOSIS — G629 Polyneuropathy, unspecified: Secondary | ICD-10-CM

## 2012-04-08 DIAGNOSIS — R209 Unspecified disturbances of skin sensation: Secondary | ICD-10-CM

## 2012-04-08 DIAGNOSIS — G619 Inflammatory polyneuropathy, unspecified: Secondary | ICD-10-CM

## 2012-04-08 DIAGNOSIS — F4321 Adjustment disorder with depressed mood: Secondary | ICD-10-CM

## 2012-04-08 DIAGNOSIS — F411 Generalized anxiety disorder: Secondary | ICD-10-CM | POA: Diagnosis not present

## 2012-04-08 DIAGNOSIS — G589 Mononeuropathy, unspecified: Secondary | ICD-10-CM

## 2012-04-08 LAB — VITAMIN B12: Vitamin B-12: 428 pg/mL (ref 211–911)

## 2012-04-08 MED ORDER — GABAPENTIN 100 MG PO CAPS: ORAL_CAPSULE | ORAL | Status: DC

## 2012-04-08 NOTE — Progress Notes (Signed)
  Subjective:    Patient ID: Brianna Reynolds, female    DOB: May 12, 1924, 78 y.o.   MRN: [id number]  HPI  78 year old female presents with chief complaint of numbness in both feet, toes, and above upper lip; "feels terrible" on going  CT scan in July 2012 normal  Review of Systems     Objective:   Physical Exam        Assessment & Plan:

## 2012-04-08 NOTE — Progress Notes (Signed)
  Subjective:    Patient ID: Brianna Reynolds, female    DOB: September 25, 1923, 78 y.o.   MRN: [id number]  HPI patient here with a chief complaint of numbness in her toes bilaterally she has no history of diabetes. She is not on any medications associated with neuropathy. She saw Dr. Dallas Schimke Recently and was started on Zoloft for depression. Isocal was elevated so she had a him a no and protein electrophoresis which did not reveal any definite evidence of myeloma. She has good by mouth of anxiety at times. She does enjoy traveling with her family.    Review of Systems     Objective:   Physical Exam the deep tendon reflexes in the knees bilaterally deep tendon reflexes in the ankles are 1+. Her dorsalis pedis and posterior tibial pulses are 2+. She describes a decreased vibratory sensation in the left great toe but it is minimal. Position sense is normal. Sensation was symmetrical using a paperclip.         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 we'll make sure she is on multivitamin with B complex one a day. We'll give her Tylenol Neurontin 100 mg one at at bedtime for one week then increase to 2 at at bedtime. She is to continue her Zoloft. We'll do recheck in about one month to check on status.

## 2012-04-08 NOTE — Patient Instructions (Addendum)
Neuropathy Neuropathy means your peripheral nerves are not working normally. Peripheral nerves are the nerves outside the brain and spinal cord. Messages between the brain and the rest of the body do not work properly with peripheral nerve disorders. CAUSES There are many different causes of peripheral nerve disorders. These include:  Injury.   Infections.   Diabetes.   Vitamin deficiency.   Poor circulation.   Alcoholism.   Exposure to toxins.   Drug effects.   Tumors.   Kidney disease.  SYMPTOMS  Tingling, burning, pain, and numbness in the extremities.   Weakness and loss of muscle tone and size.  DIAGNOSIS Blood tests and special studies of nerve function may help confirm the diagnosis.  TREATMENT  Treatment includes adopting healthy life habits.   A good diet, vitamin supplements, and mild pain medicine may be needed.   Avoid known toxins such as alcohol, tobacco, and recreational drugs.   Anti-convulsant medicines are helpful in some types of neuropathy.  Make a follow-up appointment with your caregiver to be sure you are getting better with treatment.  SEEK IMMEDIATE MEDICAL CARE IF:   You have breathing problems.   You have severe or uncontrolled pain.   You notice extreme weakness or you feel faint.   You are not better after 1 week or if you have worse symptoms.  Document Released: 08/25/2004 Document Revised: 03/30/2011 Document Reviewed: 07/18/2005 [employer] Patient Information 2012 [employer]. 

## 2012-04-09 ENCOUNTER — Encounter: Payer: Self-pay | Admitting: *Deleted

## 2012-08-15 DIAGNOSIS — H5310 Unspecified subjective visual disturbances: Secondary | ICD-10-CM | POA: Diagnosis not present

## 2012-08-15 DIAGNOSIS — Z961 Presence of intraocular lens: Secondary | ICD-10-CM | POA: Diagnosis not present

## 2012-08-15 DIAGNOSIS — H538 Other visual disturbances: Secondary | ICD-10-CM | POA: Diagnosis not present

## 2012-08-15 DIAGNOSIS — H35369 Drusen (degenerative) of macula, unspecified eye: Secondary | ICD-10-CM | POA: Diagnosis not present

## 2012-09-03 ENCOUNTER — Other Ambulatory Visit: Payer: Self-pay | Admitting: Emergency Medicine

## 2012-09-12 ENCOUNTER — Other Ambulatory Visit: Payer: Self-pay | Admitting: Family Medicine

## 2012-09-12 ENCOUNTER — Other Ambulatory Visit: Payer: Self-pay | Admitting: Internal Medicine

## 2012-09-14 ENCOUNTER — Telehealth: Payer: Self-pay

## 2012-09-14 ENCOUNTER — Ambulatory Visit (INDEPENDENT_AMBULATORY_CARE_PROVIDER_SITE_OTHER): Payer: Medicare Other | Admitting: Emergency Medicine

## 2012-09-14 VITALS — BP 127/73 | HR 98 | Temp 98.2°F | Resp 16 | Ht 59.0 in | Wt 137.8 lb

## 2012-09-14 DIAGNOSIS — R259 Unspecified abnormal involuntary movements: Secondary | ICD-10-CM | POA: Diagnosis not present

## 2012-09-14 DIAGNOSIS — I10 Essential (primary) hypertension: Secondary | ICD-10-CM | POA: Diagnosis not present

## 2012-09-14 DIAGNOSIS — F411 Generalized anxiety disorder: Secondary | ICD-10-CM | POA: Diagnosis not present

## 2012-09-14 DIAGNOSIS — R251 Tremor, unspecified: Secondary | ICD-10-CM

## 2012-09-14 MED ORDER — SERTRALINE HCL 50 MG PO TABS: ORAL_TABLET | ORAL | Status: DC

## 2012-09-14 MED ORDER — CLONAZEPAM 0.5 MG PO TABS: 0.5000 mg | ORAL_TABLET | Freq: Three times a day (TID) | ORAL | Status: DC | PRN

## 2012-09-14 MED ORDER — AMLODIPINE BESYLATE 5 MG PO TABS: 5.0000 mg | ORAL_TABLET | Freq: Every day | ORAL | Status: DC

## 2012-09-14 MED ORDER — ALPRAZOLAM 0.25 MG PO TABS
0.2500 mg | ORAL_TABLET | Freq: Once | ORAL | Status: AC
  Administered 2012-09-14: 0.25 mg via ORAL

## 2012-09-14 NOTE — Progress Notes (Signed)
  Subjective:    Patient ID: Brianna Reynolds, female    DOB: 10-24-1923, 79 y.o.   MRN: [id number]  HPI Patient comes in today with a complaint of tremors that started yesterday. Patient is here with her daughter. She did not have a fear of the snow storm. She states she has been shaking like a leaf. She had the flashing lights yesterday but a migraine did not follow. She is taking medication but there is confusion as to which she is taking. She stopped taking her Zoloft a few days ago. She stopped taking it because she felt she was taking too many drugs. She just stopped taking it. She uses [facility]. She had her Klonopin fill last on 07/23/12 for a qty of 90. She also had her Zoloft filled on 09/12/12 for a qty of 30.    Review of Systems     Objective:   Physical Exam patient is alert and neck is supple. Chest is clear to auscultation and percussion. Neurologically patient is intact patient states over and over again she is jittery and nervous and wants to stop shaking        Assessment & Plan:  I suspect her symptoms are related to changes in medication. I am not clear whether she stopped her Zoloft or she stopped her Klonopin but I have discussed this with the daughter and we're going to track this down . He in the office I gave her Xanax 0.25. I refilled her Klonopin and her Zoloft

## 2012-09-14 NOTE — Telephone Encounter (Signed)
Husband requesting stronger tranquiller for wife   [phone]

## 2012-09-18 ENCOUNTER — Ambulatory Visit (INDEPENDENT_AMBULATORY_CARE_PROVIDER_SITE_OTHER): Payer: Medicare Other | Admitting: Emergency Medicine

## 2012-09-18 VITALS — BP 132/68 | HR 86 | Temp 97.9°F | Resp 18 | Ht 59.0 in | Wt 137.0 lb

## 2012-09-18 DIAGNOSIS — F411 Generalized anxiety disorder: Secondary | ICD-10-CM

## 2012-09-18 NOTE — Progress Notes (Signed)
[facility] [address], [place] [address] [phone]  Date:  09/18/2012   Name:  ESLI CLEMENTS   DOB:  Jun 09, 1924   MRN:  [id number]  PCP:  Lucilla Edin, MD    Chief Complaint: Follow-up and Anxiety   History of Present Illness:  Brianna Reynolds is a 79 y.o. very pleasant female patient who presents with the following:  Saw Dr Cleta Alberts Friday for anxiety.  Apparently she stopped taking her routine zolft and clonazepam over the past month as she ran out.  Was seen and reordered her medications.  She has not taken them as prescribed.  Now says she is "jumping out of her skin" and can't sit still and needs "something" by injection to settle her nerves.  She denies any new symptom or complaint.  Patient Active Problem List  Diagnosis  . Abnormal EKG  . HTN (hypertension)  . Fatigue    Past Medical History  Diagnosis Date  . Abnormal EKG   . Fatigue   . Hyperlipidemia   . Hypothyroid   . HTN (hypertension)     New  . Cataract   . Anxiety     Past Surgical History  Procedure Laterality Date  . Thyroidectomy, partial    . Parathyroidectomy    . Breast surgery      History  Substance Use Topics  . Smoking status: Former Smoker    Quit date: 02/10/1991  . Smokeless tobacco: Not on file  . Alcohol Use: No    Family History  Problem Relation Age of Onset  . Arthritis Mother     Allergies  Allergen Reactions  . Penicillins     Medication list has been reviewed and updated.  Current Outpatient Prescriptions on File Prior to Visit  Medication Sig Dispense Refill  . amLODipine (NORVASC) 5 MG tablet Take 1 tablet (5 mg total) by mouth daily.  90 tablet  3  . aspirin EC 81 MG tablet Take 81 mg by mouth daily.      Marland Kitchen atorvastatin (LIPITOR) 10 MG tablet TAKE ONE TABLET AT BEDTIME.  30 tablet  1  . Calcium Carbonate (CALTRATE 600 PO) Take 1 tablet by mouth daily.      . Cholecalciferol (VITAMIN D) 2000 UNITS CAPS Take 1 capsule by  mouth daily.       . clonazePAM (KLONOPIN) 0.5 MG tablet Take 1 tablet (0.5 mg total) by mouth 3 (three) times daily as needed. For anxiety.  90 tablet  5  . fish oil-omega-3 fatty acids 1000 MG capsule Take 1 g by mouth daily.       Marland Kitchen gabapentin (NEURONTIN) 100 MG capsule 1 by mouth each bedtime for one week to increase to 2 by mouth each bedtime  60 capsule  11  . levothyroxine (SYNTHROID) 50 MCG tablet Take 1 tablet (50 mcg total) by mouth daily.  30 tablet  5  . Melatonin 3 MG CAPS Take 1 capsule by mouth at bedtime as needed. For sleep.      Marland Kitchen QUEtiapine (SEROQUEL) 25 MG tablet Take 50 mg by mouth at bedtime.      . sertraline (ZOLOFT) 50 MG tablet TAKE 1 TABLET EACH DAY.  30 tablet  11   No current facility-administered medications on file prior to visit.    Review of Systems:  As per HPI, otherwise negative.    Physical Examination: Filed Vitals:   09/18/12 1239  BP:   Pulse: 86  Temp:   Resp:    Filed Vitals:   09/18/12 1225  Height: 4\' 11"  (1.499 m)  Weight: 137 lb (62.143 kg)   Body mass index is 27.66 kg/(m^2). Ideal Body Weight: Weight in (lb) to have BMI = 25: 123.5   GEN: WDWN, NAD, Non-toxic, Alert & Oriented x 3 HEENT: Atraumatic, Normocephalic.  Ears and Nose: No external deformity. EXTR: No clubbing/cyanosis/edema NEURO: Normal gait.  PSYCH: Normally interactive. Conversant. Not depressed.   Is very anxious and occasionally tremulous.  perseverates    Assessment and Plan: Anxiety disorder  Instructed to continue to take her medication as directed Discharged in care of her daughter.  Carmelina Dane, MD

## 2012-09-18 NOTE — Patient Instructions (Addendum)

## 2012-09-20 ENCOUNTER — Telehealth: Payer: Self-pay

## 2012-09-20 NOTE — Telephone Encounter (Signed)
Dr copland patients husband would like to talk to you regarding her medication  [phone]

## 2012-09-21 NOTE — Telephone Encounter (Signed)
Called line busy.

## 2012-09-24 NOTE — Telephone Encounter (Signed)
Patients husband states she did not sleep well one day last week she was asking for sleep meds, however now she is better. He advised she had stopped taking the antidepressants, now she is back on this and is improving. He has made appt for her to follow up with Dr Cleta Alberts.

## 2012-09-26 ENCOUNTER — Other Ambulatory Visit: Payer: Self-pay | Admitting: Emergency Medicine

## 2012-09-27 ENCOUNTER — Telehealth: Payer: Self-pay

## 2012-09-27 DIAGNOSIS — F411 Generalized anxiety disorder: Secondary | ICD-10-CM

## 2012-09-27 MED ORDER — QUETIAPINE FUMARATE 25 MG PO TABS: ORAL_TABLET | ORAL | Status: DC

## 2012-09-27 NOTE — Telephone Encounter (Signed)
Dr Cleta Alberts sent in a new Rx for pt w/changed sig and quantity. Notified pt's husband who thanked [place].

## 2012-09-27 NOTE — Telephone Encounter (Signed)
Pt's husband called upset because [facility] has faxed [place] a request to RF pt's Seroquil for sleep and we denied it because it was too soon. Husband states pt sometimes has to take another tab in the middle of the night if she wakes up and can not get back to sleep. This Rx was last filled for #60 on 09/06/12 and pt will be out on Saturday. Husband states pt is very concerned she will run out of her sleep medication and asks Dr Cleta Alberts to RF it. She has f/up appt on 10/16/12. Dr Cleta Alberts, please advise.

## 2012-10-16 ENCOUNTER — Ambulatory Visit: Payer: Medicare Other | Admitting: Emergency Medicine

## 2012-10-29 ENCOUNTER — Other Ambulatory Visit: Payer: Self-pay | Admitting: Physician Assistant

## 2012-11-06 ENCOUNTER — Encounter: Payer: Self-pay | Admitting: Emergency Medicine

## 2012-11-06 ENCOUNTER — Ambulatory Visit (INDEPENDENT_AMBULATORY_CARE_PROVIDER_SITE_OTHER): Payer: Medicare Other | Admitting: Emergency Medicine

## 2012-11-06 VITALS — BP 108/60 | HR 91 | Temp 98.4°F | Resp 16 | Ht 59.0 in | Wt 138.2 lb

## 2012-11-06 DIAGNOSIS — I10 Essential (primary) hypertension: Secondary | ICD-10-CM | POA: Diagnosis not present

## 2012-11-06 DIAGNOSIS — E785 Hyperlipidemia, unspecified: Secondary | ICD-10-CM

## 2012-11-06 DIAGNOSIS — F329 Major depressive disorder, single episode, unspecified: Secondary | ICD-10-CM

## 2012-11-06 DIAGNOSIS — F411 Generalized anxiety disorder: Secondary | ICD-10-CM

## 2012-11-06 DIAGNOSIS — G4700 Insomnia, unspecified: Secondary | ICD-10-CM | POA: Diagnosis not present

## 2012-11-06 NOTE — Progress Notes (Signed)
  Subjective:    Patient ID: Brianna Reynolds, female    DOB: 05-07-1924, 79 y.o.   MRN: [id number]  HPI Follow up for hypertension. Does not check blood pressure at home.  Says her mental state is fine. She has been sleeping well. States there is nothing wrong with her, but she just feels miserable. Lacks energy and does not feel like doing anything.    Review of Systems  Constitutional: Positive for fatigue. Negative for fever, chills, activity change, appetite change and unexpected weight change.  Respiratory: Negative.   Cardiovascular: Negative.   Musculoskeletal: Negative.   Neurological: Negative.        Objective:   Physical Exam  Constitutional: She is oriented to person, place, and time. She appears well-developed and well-nourished.  HENT:  Head: Normocephalic and atraumatic.  Neck: Normal range of motion. Neck supple.  Cardiovascular: Normal rate and regular rhythm.   Murmur (2/6 systolic ejection murmur) heard. Pulmonary/Chest: Effort normal and breath sounds normal.  Abdominal: Soft. Bowel sounds are normal.  Neurological: She is alert and oriented to person, place, and time.  Skin: Skin is warm and dry.  Psychiatric: She has a normal mood and affect.   Repeat pulse ox was 96.       Assessment & Plan:  Patient looks great today. She is agreeable to start exercising in the near future. She would do walking with her husband will recheck in 3 months  And do blood work at that time. Patient needs repeat blood work on her followup visit to include a CBC Cmet , immunoelectrophoresis, as well as protein electrophoresis.

## 2012-12-26 ENCOUNTER — Other Ambulatory Visit: Payer: Self-pay | Admitting: Physician Assistant

## 2012-12-26 NOTE — Telephone Encounter (Signed)
Patient seen 10/2012 by Dr. Cleta Alberts.  Was to follow-up in 3 months, with labs at that time.

## 2013-02-11 ENCOUNTER — Ambulatory Visit (INDEPENDENT_AMBULATORY_CARE_PROVIDER_SITE_OTHER): Payer: Medicare Other | Admitting: Emergency Medicine

## 2013-02-11 VITALS — BP 120/68 | HR 80 | Temp 98.0°F | Resp 16 | Ht 58.5 in | Wt 137.0 lb

## 2013-02-11 DIAGNOSIS — R799 Abnormal finding of blood chemistry, unspecified: Secondary | ICD-10-CM | POA: Diagnosis not present

## 2013-02-11 DIAGNOSIS — I10 Essential (primary) hypertension: Secondary | ICD-10-CM

## 2013-02-11 DIAGNOSIS — E039 Hypothyroidism, unspecified: Secondary | ICD-10-CM

## 2013-02-11 DIAGNOSIS — E782 Mixed hyperlipidemia: Secondary | ICD-10-CM

## 2013-02-11 DIAGNOSIS — R5381 Other malaise: Secondary | ICD-10-CM | POA: Diagnosis not present

## 2013-02-11 DIAGNOSIS — R5383 Other fatigue: Secondary | ICD-10-CM | POA: Diagnosis not present

## 2013-02-11 DIAGNOSIS — R778 Other specified abnormalities of plasma proteins: Secondary | ICD-10-CM | POA: Insufficient documentation

## 2013-02-11 LAB — POCT CBC
Granulocyte percent: 74.7 %G (ref 37–80)
HCT, POC: 48 % — AB (ref 37.7–47.9)
Hemoglobin: 14.8 g/dL (ref 12.2–16.2)
MCV: 95.4 fL (ref 80–97)
POC LYMPH PERCENT: 19.7 %L (ref 10–50)
RBC: 5.03 M/uL (ref 4.04–5.48)

## 2013-02-11 MED ORDER — ATORVASTATIN CALCIUM 10 MG PO TABS: ORAL_TABLET | ORAL | Status: DC

## 2013-02-11 NOTE — Progress Notes (Signed)
  Subjective:    Patient ID: Brianna Reynolds, female    DOB: 1924/06/01, 79 y.o.   MRN: [id number]  HPI Patient is here today for a follow up visit of her blood pressure Still lack of energy doesn't feel like doing any thing haven't been exercising walking etc Eating fine Blood pressure from today is 120/68 last OV in April was 108/60 Weight for todays visit is 137 and last OV visit in April was 138 Need medicine refill   Review of Systems  Constitutional: Positive for fatigue. Negative for appetite change.  Neurological: Negative for dizziness and headaches.       Objective:   Physical Exam patient is alert and cooperative she does not appear in any distress. Her neck is supple. Chest is clear to auscultation and percussion. Cardiac exam is regular rate without murmurs. Abdomen is soft and nontender extremity exam reveals dry scaly skin but no swelling the  Results for orders placed in visit on 02/11/13  POCT CBC      Result Value Range   WBC 11.0 (*) 4.6 - 10.2 K/uL   Lymph, poc 2.2  0.6 - 3.4   POC LYMPH PERCENT 19.7  10 - 50 %L   MID (cbc) 0.6  0 - 0.9   POC MID % 5.6  0 - 12 %M   POC Granulocyte 8.2 (*) 2 - 6.9   Granulocyte percent 74.7  37 - 80 %G   RBC 5.03  4.04 - 5.48 M/uL   Hemoglobin 14.8  12.2 - 16.2 g/dL   HCT, POC 57.8 (*) 46.9 - 47.9 %   MCV 95.4  80 - 97 fL   MCH, POC 29.4  27 - 31.2 pg   MCHC 30.8 (*) 31.8 - 35.4 g/dL   RDW, POC 62.9     Platelet Count, POC 339  142 - 424 K/uL   MPV 9.4  0 - 99.8 fL        Assessment & Plan:  Followup immuno and protein electrophoresis were done along with her routine labs. I repeated her pulse ox and it was 98 not 92 as her initial vital signs showed

## 2013-02-12 LAB — COMPREHENSIVE METABOLIC PANEL
Albumin: 4.5 g/dL (ref 3.5–5.2)
BUN: 11 mg/dL (ref 6–23)
CO2: 25 mEq/L (ref 19–32)
Calcium: 10.7 mg/dL — ABNORMAL HIGH (ref 8.4–10.5)
Chloride: 104 mEq/L (ref 96–112)
Glucose, Bld: 99 mg/dL (ref 70–99)
Potassium: 4.6 mEq/L (ref 3.5–5.3)
Total Protein: 7.3 g/dL (ref 6.0–8.3)

## 2013-02-12 LAB — LIPID PANEL
Cholesterol: 316 mg/dL — ABNORMAL HIGH (ref 0–200)
LDL Cholesterol: 210 mg/dL — ABNORMAL HIGH (ref 0–99)
Triglycerides: 236 mg/dL — ABNORMAL HIGH (ref <150)

## 2013-02-13 LAB — IMMUNOFIXATION ELECTROPHORESIS
IgA: 88 mg/dL (ref 69–380)
IgG (Immunoglobin G), Serum: 618 mg/dL — ABNORMAL LOW (ref 690–1700)
IgM, Serum: 439 mg/dL — ABNORMAL HIGH (ref 52–322)

## 2013-02-13 LAB — PROTEIN ELECTROPHORESIS, SERUM
Albumin ELP: 55.7 % — ABNORMAL LOW (ref 55.8–66.1)
Beta 2: 4.4 % (ref 3.2–6.5)
Total Protein, Serum Electrophoresis: 7.3 g/dL (ref 6.0–8.3)

## 2013-02-28 ENCOUNTER — Ambulatory Visit (INDEPENDENT_AMBULATORY_CARE_PROVIDER_SITE_OTHER): Payer: Medicare Other | Admitting: Emergency Medicine

## 2013-02-28 VITALS — BP 126/64 | HR 91 | Temp 97.8°F | Resp 17 | Ht 59.5 in | Wt 137.0 lb

## 2013-02-28 DIAGNOSIS — F411 Generalized anxiety disorder: Secondary | ICD-10-CM

## 2013-02-28 DIAGNOSIS — G4700 Insomnia, unspecified: Secondary | ICD-10-CM | POA: Diagnosis not present

## 2013-02-28 NOTE — Progress Notes (Signed)
  Subjective:    Patient ID: Brianna Reynolds, female    DOB: May 18, 1924, 79 y.o.   MRN: [id number]  HPI  79 YO female patient here today with complaints of insomnia last night even after taking her medication. She takes Seroquel for sleep. She is states she only takes Lipitor and Seroquel at night. She has not taken her medication today. She does not feel well today.   She will be traveling to [place]. She is concerned that she will not be able to sleep.   Review of Systems     Objective:   Physical Exam there is no change in her exam. She has the same questions over and over again. She is fixated on her inability to sleep. She has no focal neurological signs        Assessment & Plan:  Increase Seroquel to 2 tablets at night with 1 tablet of Melatonin.

## 2013-02-28 NOTE — Patient Instructions (Signed)
Increase Seroquel to 2 tablets at night with 1 tablet of Melatonin.

## 2013-03-26 ENCOUNTER — Other Ambulatory Visit: Payer: Self-pay | Admitting: Physician Assistant

## 2013-04-15 ENCOUNTER — Other Ambulatory Visit: Payer: Self-pay | Admitting: Physician Assistant

## 2013-05-13 ENCOUNTER — Encounter: Payer: Self-pay | Admitting: Emergency Medicine

## 2013-05-13 ENCOUNTER — Ambulatory Visit (INDEPENDENT_AMBULATORY_CARE_PROVIDER_SITE_OTHER): Payer: Medicare Other | Admitting: Emergency Medicine

## 2013-05-13 VITALS — BP 132/72 | HR 89 | Temp 98.4°F | Resp 16 | Ht 58.5 in | Wt 136.2 lb

## 2013-05-13 DIAGNOSIS — R799 Abnormal finding of blood chemistry, unspecified: Secondary | ICD-10-CM

## 2013-05-13 DIAGNOSIS — I10 Essential (primary) hypertension: Secondary | ICD-10-CM

## 2013-05-13 DIAGNOSIS — Z23 Encounter for immunization: Secondary | ICD-10-CM | POA: Diagnosis not present

## 2013-05-13 DIAGNOSIS — R778 Other specified abnormalities of plasma proteins: Secondary | ICD-10-CM

## 2013-05-13 DIAGNOSIS — E785 Hyperlipidemia, unspecified: Secondary | ICD-10-CM | POA: Diagnosis not present

## 2013-05-13 LAB — LIPID PANEL
HDL: 59 mg/dL (ref >39)
LDL Cholesterol: 73 mg/dL (ref 0–99)
Total CHOL/HDL Ratio: 3 Ratio
Triglycerides: 234 mg/dL — ABNORMAL HIGH (ref <150)
VLDL: 47 mg/dL — ABNORMAL HIGH (ref 0–40)

## 2013-05-13 LAB — COMPREHENSIVE METABOLIC PANEL
AST: 14 U/L (ref 0–37)
Albumin: 4.1 g/dL (ref 3.5–5.2)
Alkaline Phosphatase: 66 U/L (ref 39–117)
BUN: 11 mg/dL (ref 6–23)
Glucose, Bld: 95 mg/dL (ref 70–99)
Potassium: 4.2 mEq/L (ref 3.5–5.3)
Total Bilirubin: 0.6 mg/dL (ref 0.3–1.2)

## 2013-05-13 MED ORDER — ZOSTER VACCINE LIVE 19400 UNT/0.65ML ~~LOC~~ SOLR: 0.6500 mL | Freq: Once | SUBCUTANEOUS | Status: DC

## 2013-05-13 NOTE — Progress Notes (Signed)
  Subjective:    Patient ID: PARISSA CHIAO, female    DOB: 1923-11-24, 79 y.o.   MRN: [id number]  HPI patient here for followup. She states she has been doing well recently. Of note she has had some difficulty with elevated calcium in the past secondary to a parathyroid adenoma. She also has some memory issues and is followed by [facility]. She has difficulty with anxiety but has responded well to combination of Seroquel Zoloft and Klonopin. Her last cholesterol was significantly elevated and she started on Lipitor .    Review of Systems     Objective:   Physical Exam patient is alert and cooperative today she is not anxious chest was clear heart regular rate no murmurs extremities without edema neurological was intact without focal signs       Assessment & Plan:  She was given a flu shot today. She was also given a prescription for shingles vaccine. Routine labs were done. I repeated her calcium and parathormone levels and repeated her Sinemet to see the status of her serum protein. Also did a CBC because she has had an abnormal protein electrophoresis in the past.

## 2013-05-14 LAB — PTH, INTACT AND CALCIUM: PTH: 38.6 pg/mL (ref 14.0–72.0)

## 2013-06-04 ENCOUNTER — Ambulatory Visit: Payer: Medicare Other

## 2013-06-04 ENCOUNTER — Ambulatory Visit (INDEPENDENT_AMBULATORY_CARE_PROVIDER_SITE_OTHER): Payer: Medicare Other | Admitting: Family Medicine

## 2013-06-04 VITALS — BP 100/60 | HR 90 | Temp 98.6°F | Resp 18 | Ht 58.5 in | Wt 135.0 lb

## 2013-06-04 DIAGNOSIS — M79609 Pain in unspecified limb: Secondary | ICD-10-CM

## 2013-06-04 DIAGNOSIS — M79601 Pain in right arm: Secondary | ICD-10-CM

## 2013-06-04 DIAGNOSIS — S40021A Contusion of right upper arm, initial encounter: Secondary | ICD-10-CM

## 2013-06-04 DIAGNOSIS — S40029A Contusion of unspecified upper arm, initial encounter: Secondary | ICD-10-CM

## 2013-06-04 MED ORDER — TRAMADOL HCL 50 MG PO TABS: 50.0000 mg | ORAL_TABLET | Freq: Three times a day (TID) | ORAL | Status: DC | PRN

## 2013-06-04 NOTE — Progress Notes (Signed)
Subjective: 79 year old lady who was going out in her yard today to do some yard work. She stepped in a hole and fell sideways landing on her right upper arm. She had initial pain down into the third finger of her hand, but that has subsided. Now she is just hurting in the upper arm. She has good strength and range of motion with it.  Objective: She is moving her arm well. She neck is nontender and has good range of motion. Upper shoulder is all normal. The right arm is a little tender in the biceps and deltoid area. She has no wounds or major bruises visible yet. Elbow motion is good. Forearm seems normal with good motion of the above-mentioned finger.  Assessment: Contusion right upper arm, rule out bony injury  Plan: X-ray right humerus  [facility] reading (PRIMARY) by  Dr. Alwyn Ren No fracture  Symptomatic rx.Marland Kitchen

## 2013-06-04 NOTE — Patient Instructions (Signed)
Ice  Tylenol.  If pain worse take tramadol.  Return if worse.

## 2013-06-11 ENCOUNTER — Telehealth: Payer: Self-pay

## 2013-06-11 DIAGNOSIS — F411 Generalized anxiety disorder: Secondary | ICD-10-CM

## 2013-06-11 MED ORDER — CLONAZEPAM 0.5 MG PO TABS: 0.5000 mg | ORAL_TABLET | Freq: Three times a day (TID) | ORAL | Status: DC | PRN

## 2013-06-11 NOTE — Telephone Encounter (Signed)
Okay to refill her medication 

## 2013-06-11 NOTE — Telephone Encounter (Signed)
Brianna Reynolds STATES THE PHARMACY HAVE BEEN TRYING TO GET A REFILL ON HIS WIFE'S CLONAZEPAM. SHE IS OUT. PLEASE CALL [phone]    [name]

## 2013-06-11 NOTE — Telephone Encounter (Signed)
printed

## 2013-06-11 NOTE — Telephone Encounter (Signed)
Pended please advise.  

## 2013-08-20 DIAGNOSIS — H353190 Nonexudative age-related macular degeneration, unspecified eye, stage unspecified: Secondary | ICD-10-CM | POA: Diagnosis not present

## 2013-08-20 DIAGNOSIS — H1045 Other chronic allergic conjunctivitis: Secondary | ICD-10-CM | POA: Diagnosis not present

## 2013-08-20 DIAGNOSIS — H02839 Dermatochalasis of unspecified eye, unspecified eyelid: Secondary | ICD-10-CM | POA: Diagnosis not present

## 2013-08-20 DIAGNOSIS — H43819 Vitreous degeneration, unspecified eye: Secondary | ICD-10-CM | POA: Diagnosis not present

## 2013-08-20 DIAGNOSIS — Z961 Presence of intraocular lens: Secondary | ICD-10-CM | POA: Diagnosis not present

## 2013-08-20 DIAGNOSIS — H04129 Dry eye syndrome of unspecified lacrimal gland: Secondary | ICD-10-CM | POA: Diagnosis not present

## 2013-08-20 DIAGNOSIS — H26499 Other secondary cataract, unspecified eye: Secondary | ICD-10-CM | POA: Diagnosis not present

## 2013-08-22 ENCOUNTER — Other Ambulatory Visit: Payer: Self-pay | Admitting: Emergency Medicine

## 2013-08-27 ENCOUNTER — Telehealth: Payer: Self-pay

## 2013-08-27 DIAGNOSIS — F411 Generalized anxiety disorder: Secondary | ICD-10-CM

## 2013-08-27 NOTE — Telephone Encounter (Signed)
Fax from pharm stating that pt reports she only takes Seroquel 2 tabs Qhs, not during the day and wants a Rx with this sig. Dr Cleta Albertsaub, is it OK for pt to just be taking it this way, and may I send in a Rx w/this sig? I have pended it.

## 2013-08-27 NOTE — Telephone Encounter (Signed)
Yes she takes 2 tablets at bedtime #60 refill for one year

## 2013-08-28 MED ORDER — QUETIAPINE FUMARATE 25 MG PO TABS: ORAL_TABLET | ORAL | Status: DC

## 2013-08-28 NOTE — Telephone Encounter (Signed)
Sent Rx 

## 2013-08-29 DIAGNOSIS — H26499 Other secondary cataract, unspecified eye: Secondary | ICD-10-CM | POA: Diagnosis not present

## 2013-09-17 ENCOUNTER — Ambulatory Visit: Payer: Medicare Other | Admitting: Emergency Medicine

## 2013-10-30 ENCOUNTER — Other Ambulatory Visit: Payer: Self-pay | Admitting: Emergency Medicine

## 2013-11-12 ENCOUNTER — Ambulatory Visit (INDEPENDENT_AMBULATORY_CARE_PROVIDER_SITE_OTHER): Payer: Medicare Other | Admitting: Emergency Medicine

## 2013-11-12 VITALS — BP 101/64 | HR 90 | Temp 98.2°F | Resp 18 | Ht 59.0 in | Wt 132.0 lb

## 2013-11-12 DIAGNOSIS — G4700 Insomnia, unspecified: Secondary | ICD-10-CM

## 2013-11-12 DIAGNOSIS — R778 Other specified abnormalities of plasma proteins: Secondary | ICD-10-CM

## 2013-11-12 DIAGNOSIS — F411 Generalized anxiety disorder: Secondary | ICD-10-CM

## 2013-11-12 DIAGNOSIS — R799 Abnormal finding of blood chemistry, unspecified: Secondary | ICD-10-CM | POA: Diagnosis not present

## 2013-11-12 DIAGNOSIS — I10 Essential (primary) hypertension: Secondary | ICD-10-CM

## 2013-11-12 DIAGNOSIS — E039 Hypothyroidism, unspecified: Secondary | ICD-10-CM | POA: Diagnosis not present

## 2013-11-12 DIAGNOSIS — E7800 Pure hypercholesterolemia, unspecified: Secondary | ICD-10-CM | POA: Diagnosis not present

## 2013-11-12 LAB — LIPID PANEL
CHOL/HDL RATIO: 2.8 ratio
CHOLESTEROL: 170 mg/dL (ref 0–200)
HDL: 61 mg/dL (ref >39)
LDL Cholesterol: 76 mg/dL (ref 0–99)
TRIGLYCERIDES: 163 mg/dL — AB (ref <150)
VLDL: 33 mg/dL (ref 0–40)

## 2013-11-12 LAB — COMPLETE METABOLIC PANEL WITH GFR
ALBUMIN: 4 g/dL (ref 3.5–5.2)
ALK PHOS: 62 U/L (ref 39–117)
ALT: <8 U/L (ref 0–35)
AST: 14 U/L (ref 0–37)
BUN: 11 mg/dL (ref 6–23)
CALCIUM: 9.7 mg/dL (ref 8.4–10.5)
CHLORIDE: 104 meq/L (ref 96–112)
CO2: 24 mEq/L (ref 19–32)
Creat: 0.72 mg/dL (ref 0.50–1.10)
GFR, EST NON AFRICAN AMERICAN: 76 mL/min
GFR, Est African American: 87 mL/min
GLUCOSE: 84 mg/dL (ref 70–99)
POTASSIUM: 4.2 meq/L (ref 3.5–5.3)
Sodium: 138 mEq/L (ref 135–145)
Total Bilirubin: 0.7 mg/dL (ref 0.2–1.2)
Total Protein: 6.6 g/dL (ref 6.0–8.3)

## 2013-11-12 LAB — CBC
HEMATOCRIT: 41.5 % (ref 36.0–46.0)
HEMOGLOBIN: 14.3 g/dL (ref 12.0–15.0)
MCH: 29.5 pg (ref 26.0–34.0)
MCHC: 34.5 g/dL (ref 30.0–36.0)
MCV: 85.6 fL (ref 78.0–100.0)
Platelets: 301 10*3/uL (ref 150–400)
RBC: 4.85 MIL/uL (ref 3.87–5.11)
RDW: 14 % (ref 11.5–15.5)
WBC: 9.4 10*3/uL (ref 4.0–10.5)

## 2013-11-12 NOTE — Progress Notes (Signed)
   Subjective:    Patient ID: Brianna Reynolds, female    DOB: [birth year], 80 y.o.   MRN: [id number]  HPI  This chart was scribed for Collene GobbleSteven A  Paone, MD by Charline BillsEssence Howell, ED Scribe. The patient was seen in room 22. Patient's care was started at 10:29 AM.  HPI Comments: Brianna Reynolds is a 80 y.o. female, with a history of Insomnia and Anxiety, who presents [facility] the [facility] follow[facility]. She reports sleeping well, especially with the assistant of her nightly sleeping pill. Pt denies anxiety at this moment.    Past Medical History  Diagnosis Date  . Abnormal EKG   . Fatigue   . Hyperlipidemia   . Hypothyroid   . HTN (hypertension)     New  . Cataract   . Anxiety    Past Surgical History  Procedure Laterality Date  . Thyroidectomy, partial    . Parathyroidectomy    . Breast surgery     Allergies  Allergen Reactions  . Penicillins     Review of Systems  Psychiatric/Behavioral: Negative for sleep disturbance. The patient is not nervous/anxious.        Objective:   Physical Exam CONSTITUTIONAL: Well developed/well nourished HEAD: Normocephalic/atraumatic EYES: EOMI/PERRL ENMT: Mucous membranes moist NECK: supple no meningeal signs SPINE:entire spine nontender CV: S1/S2 noted, no murmurs/rubs/gallops noted LUNGS: Lungs are clear to auscultation bilaterally, no apparent distress ABDOMEN: soft, nontender, no rebound or guarding GU:no cva tenderness NEURO: Pt is awake/alert, moves all extremitiesx4 EXTREMITIES: pulses normal, full ROM SKIN: warm, color normal PSYCH: no abnormalities of mood noted      Assessment & Plan:  Brianna Reynolds looks great. She does not have any specific complaints today. She is not anxious and is resting well.   I personally performed the services described in this documentation, which was scribed in my presence. The recorded information has been reviewed and is accurate.

## 2013-11-13 LAB — T4, FREE: Free T4: 1.05 ng/dL (ref 0.80–1.80)

## 2013-11-13 LAB — TSH: TSH: 3.084 u[IU]/mL (ref 0.350–4.500)

## 2013-11-14 LAB — IMMUNOFIXATION ELECTROPHORESIS
IGG (IMMUNOGLOBIN G), SERUM: 601 mg/dL — AB (ref 690–1700)
IgA: 83 mg/dL (ref 69–380)
IgM, Serum: 372 mg/dL — ABNORMAL HIGH (ref 52–322)
TOTAL PROTEIN, SERUM ELECTROPHOR: 6.6 g/dL (ref 6.0–8.3)

## 2013-11-14 LAB — PROTEIN ELECTROPHORESIS, SERUM
Albumin ELP: 57.9 % (ref 55.8–66.1)
Alpha-1-Globulin: 5.1 % — ABNORMAL HIGH (ref 2.9–4.9)
Alpha-2-Globulin: 15.1 % — ABNORMAL HIGH (ref 7.1–11.8)
Beta 2: 4.8 % (ref 3.2–6.5)
Beta Globulin: 6.2 % (ref 4.7–7.2)
GAMMA GLOBULIN: 10.9 % — AB (ref 11.1–18.8)
TOTAL PROTEIN, SERUM ELECTROPHOR: 6.6 g/dL (ref 6.0–8.3)

## 2013-11-17 ENCOUNTER — Encounter: Payer: Self-pay | Admitting: Family Medicine

## 2013-11-20 ENCOUNTER — Other Ambulatory Visit: Payer: Self-pay | Admitting: Emergency Medicine

## 2013-11-20 ENCOUNTER — Telehealth: Payer: Self-pay

## 2013-11-20 NOTE — Telephone Encounter (Signed)
PT STATES DR DAUB SAID HE WAS TRYING TO GET IN TOUCH WITH THEM, HOWEVER HE IS GIVING US THEIR PRIVATE NUMBER AND DOESN'T WANT ANYONE ELSE TO HAVE IT PLEASE CALL [phone]

## 2013-11-21 NOTE — Telephone Encounter (Signed)
Brianna Reynolds this is just a lab call back.

## 2013-11-27 ENCOUNTER — Other Ambulatory Visit: Payer: Self-pay | Admitting: Emergency Medicine

## 2013-12-03 ENCOUNTER — Other Ambulatory Visit: Payer: Self-pay | Admitting: Physician Assistant

## 2013-12-05 DIAGNOSIS — H02839 Dermatochalasis of unspecified eye, unspecified eyelid: Secondary | ICD-10-CM | POA: Diagnosis not present

## 2013-12-05 DIAGNOSIS — H04129 Dry eye syndrome of unspecified lacrimal gland: Secondary | ICD-10-CM | POA: Diagnosis not present

## 2013-12-05 DIAGNOSIS — H353190 Nonexudative age-related macular degeneration, unspecified eye, stage unspecified: Secondary | ICD-10-CM | POA: Diagnosis not present

## 2013-12-05 DIAGNOSIS — Z961 Presence of intraocular lens: Secondary | ICD-10-CM | POA: Diagnosis not present

## 2013-12-13 ENCOUNTER — Encounter: Payer: Self-pay | Admitting: Emergency Medicine

## 2014-01-02 DIAGNOSIS — H353190 Nonexudative age-related macular degeneration, unspecified eye, stage unspecified: Secondary | ICD-10-CM | POA: Diagnosis not present

## 2014-01-02 DIAGNOSIS — Z961 Presence of intraocular lens: Secondary | ICD-10-CM | POA: Diagnosis not present

## 2014-01-02 DIAGNOSIS — H5310 Unspecified subjective visual disturbances: Secondary | ICD-10-CM | POA: Diagnosis not present

## 2014-01-02 DIAGNOSIS — H02839 Dermatochalasis of unspecified eye, unspecified eyelid: Secondary | ICD-10-CM | POA: Diagnosis not present

## 2014-01-02 DIAGNOSIS — H04129 Dry eye syndrome of unspecified lacrimal gland: Secondary | ICD-10-CM | POA: Diagnosis not present

## 2014-02-19 ENCOUNTER — Other Ambulatory Visit: Payer: Self-pay | Admitting: Emergency Medicine

## 2014-02-24 ENCOUNTER — Other Ambulatory Visit: Payer: Self-pay | Admitting: Emergency Medicine

## 2014-02-26 ENCOUNTER — Other Ambulatory Visit: Payer: Self-pay | Admitting: Emergency Medicine

## 2014-02-27 ENCOUNTER — Other Ambulatory Visit: Payer: Self-pay | Admitting: Emergency Medicine

## 2014-02-27 ENCOUNTER — Telehealth: Payer: Self-pay | Admitting: Physician Assistant

## 2014-02-27 NOTE — Telephone Encounter (Signed)
Have not received approval on 02/25/2014 for patient's clonazepam refill request.  Reviewed chart.  Dr. Cleta Albertsaub authorized #[id number], RF x 5.

## 2014-03-11 ENCOUNTER — Ambulatory Visit (INDEPENDENT_AMBULATORY_CARE_PROVIDER_SITE_OTHER): Payer: Medicare Other | Admitting: Emergency Medicine

## 2014-03-11 ENCOUNTER — Encounter: Payer: Self-pay | Admitting: Emergency Medicine

## 2014-03-11 VITALS — BP 110/60 | HR 85 | Temp 98.5°F | Resp 16 | Ht 58.25 in | Wt 131.4 lb

## 2014-03-11 DIAGNOSIS — G4700 Insomnia, unspecified: Secondary | ICD-10-CM

## 2014-03-11 DIAGNOSIS — E785 Hyperlipidemia, unspecified: Secondary | ICD-10-CM

## 2014-03-11 DIAGNOSIS — F411 Generalized anxiety disorder: Secondary | ICD-10-CM | POA: Diagnosis not present

## 2014-03-11 DIAGNOSIS — Z23 Encounter for immunization: Secondary | ICD-10-CM | POA: Diagnosis not present

## 2014-03-11 DIAGNOSIS — Z129 Encounter for screening for malignant neoplasm, site unspecified: Secondary | ICD-10-CM

## 2014-03-11 DIAGNOSIS — Z1239 Encounter for other screening for malignant neoplasm of breast: Secondary | ICD-10-CM

## 2014-03-11 DIAGNOSIS — I10 Essential (primary) hypertension: Secondary | ICD-10-CM

## 2014-03-11 NOTE — Progress Notes (Addendum)
Subjective:    Patient ID: Brianna Reynolds, female    DOB: [birth year], 80 y.o.   MRN: [id number] This chart was scribed for Collene GobbleSteven A Daub, MD by Julian HyMorgan Graham, ED Scribe. The patient was seen in Room 23. The patient's care was started at 11:56 AM.  Follow-up  HPI HPI Comments: Brianna Reynolds is a 80 y.o. female who presents to the [facility] for follow-up. Pt states she is doing well at home. She is sleeping well without anxiety. Pt is taking medicaitons as instructed. No complaints today.  Review of Systems  Past Medical History  Diagnosis Date  . Abnormal EKG   . Fatigue   . Hyperlipidemia   . Hypothyroid   . HTN (hypertension)     New  . Cataract   . Anxiety    Past Surgical History  Procedure Laterality Date  . Thyroidectomy, partial    . Parathyroidectomy    . Breast surgery     Allergies  Allergen Reactions  . Penicillins Rash   Current Outpatient Prescriptions  Medication Sig Dispense Refill  . amLODipine (NORVASC) 5 MG tablet TAKE 1 TABLET ONCE DAILY.  90 tablet  1  . aspirin EC 81 MG tablet Take 81 mg by mouth daily.      Marland Kitchen. atorvastatin (LIPITOR) 10 MG tablet TAKE ONE TABLET AT BEDTIME.  30 tablet  2  . Calcium Carbonate (CALTRATE 600 PO) Take 1 tablet by mouth daily.      . Cholecalciferol (VITAMIN D) 2000 UNITS CAPS Take 1 capsule by mouth daily.       . clonazePAM (KLONOPIN) 0.5 MG tablet TAKE 1 TABLET THREE TIMES DAILY AS NEEDED FOR ANXIETY.  90 tablet  5  . Melatonin 3 MG CAPS Take 1 capsule by mouth at bedtime as needed. For sleep.      Marland Kitchen. QUEtiapine (SEROQUEL) 25 MG tablet Take 2 tablets by mouth every day at bedtime .  60 tablet  11  . sertraline (ZOLOFT) 50 MG tablet TAKE 1 TABLET ONCE DAILY.  30 tablet  5  . SYNTHROID 50 MCG tablet TAKE 1 TABLET EACH DAY.  30 tablet  5  . fish oil-omega-3 fatty acids 1000 MG capsule Take 1 g by mouth daily.       Marland Kitchen. gabapentin (NEURONTIN) 100 MG capsule 1 by mouth each bedtime for one week to  increase to 2 by mouth each bedtime  60 capsule  11  . zoster vaccine live, PF, (ZOSTAVAX) [id number] UNT/0.65ML injection Inject 19,400 Units into the skin once.  1 each  0   No current facility-administered medications for this visit.     Objective:   Triage Vitals: BP 110/60  Pulse 85  Temp(Src) 98.5 F (36.9 C) (Oral)  Resp 16  Ht 4' 10.25" (1.48 m)  Wt 131 lb 6.4 oz (59.603 kg)  BMI 27.21 kg/m2  SpO2 96%  Physical Exam CONSTITUTIONAL: Well developed/well nourished HEAD: Normocephalic/atraumatic EYES: EOMI/PERRL ENMT: Mucous membranes moist NECK: supple no meningeal signs SPINE:entire spine nontender CV: S1/S2 noted, no murmurs/rubs/gallops noted LUNGS: Lungs are clear to auscultation bilaterally, no apparent distress ABDOMEN: soft, nontender, no rebound or guarding GU:no cva tenderness NEURO: Pt is awake/alert, moves all extremitiesx4 EXTREMITIES: pulses normal, full ROM SKIN: warm, color normal PSYCH: no abnormalities of mood noted.  Assessment & Plan:  12:04 PM- Patient informed of current plan for treatment and evaluation and agrees with plan at this time. There are no changes in her  medication. She was given Prevnar today. She will be scheduled for screening mammogram. Pt doing well, no change in medications. She was given Prevnar vaccine today. Recheck 3 months, we will schedule a screening mammogram.

## 2014-03-19 DIAGNOSIS — Z1231 Encounter for screening mammogram for malignant neoplasm of breast: Secondary | ICD-10-CM | POA: Diagnosis not present

## 2014-03-24 ENCOUNTER — Ambulatory Visit (INDEPENDENT_AMBULATORY_CARE_PROVIDER_SITE_OTHER): Payer: Medicare Other | Admitting: Emergency Medicine

## 2014-03-24 ENCOUNTER — Inpatient Hospital Stay (HOSPITAL_COMMUNITY)
Admission: EM | Admit: 2014-03-24 | Discharge: 2014-03-27 | DRG: 871 | Disposition: A | Discharge status: Home Health | Payer: Medicare Other | Attending: Internal Medicine | Admitting: Internal Medicine

## 2014-03-24 ENCOUNTER — Encounter (HOSPITAL_COMMUNITY): Payer: Self-pay | Admitting: Emergency Medicine

## 2014-03-24 ENCOUNTER — Emergency Department (HOSPITAL_COMMUNITY): Payer: Medicare Other

## 2014-03-24 VITALS — BP 124/68 | HR 100 | Temp 98.3°F | Resp 16

## 2014-03-24 DIAGNOSIS — F0391 Unspecified dementia with behavioral disturbance: Secondary | ICD-10-CM

## 2014-03-24 DIAGNOSIS — R5381 Other malaise: Secondary | ICD-10-CM

## 2014-03-24 DIAGNOSIS — R404 Transient alteration of awareness: Secondary | ICD-10-CM | POA: Diagnosis not present

## 2014-03-24 DIAGNOSIS — F411 Generalized anxiety disorder: Secondary | ICD-10-CM | POA: Diagnosis present

## 2014-03-24 DIAGNOSIS — E874 Mixed disorder of acid-base balance: Secondary | ICD-10-CM | POA: Diagnosis present

## 2014-03-24 DIAGNOSIS — I10 Essential (primary) hypertension: Secondary | ICD-10-CM

## 2014-03-24 DIAGNOSIS — R6889 Other general symptoms and signs: Secondary | ICD-10-CM | POA: Diagnosis not present

## 2014-03-24 DIAGNOSIS — R5383 Other fatigue: Secondary | ICD-10-CM | POA: Diagnosis not present

## 2014-03-24 DIAGNOSIS — E039 Hypothyroidism, unspecified: Secondary | ICD-10-CM | POA: Diagnosis present

## 2014-03-24 DIAGNOSIS — G9340 Encephalopathy, unspecified: Secondary | ICD-10-CM | POA: Diagnosis present

## 2014-03-24 DIAGNOSIS — F03918 Unspecified dementia, unspecified severity, with other behavioral disturbance: Secondary | ICD-10-CM

## 2014-03-24 DIAGNOSIS — A419 Sepsis, unspecified organism: Secondary | ICD-10-CM | POA: Diagnosis present

## 2014-03-24 DIAGNOSIS — E872 Acidosis, unspecified: Secondary | ICD-10-CM | POA: Diagnosis present

## 2014-03-24 DIAGNOSIS — F29 Unspecified psychosis not due to a substance or known physiological condition: Secondary | ICD-10-CM | POA: Diagnosis not present

## 2014-03-24 DIAGNOSIS — R259 Unspecified abnormal involuntary movements: Secondary | ICD-10-CM | POA: Diagnosis not present

## 2014-03-24 DIAGNOSIS — E7800 Pure hypercholesterolemia, unspecified: Secondary | ICD-10-CM

## 2014-03-24 DIAGNOSIS — E785 Hyperlipidemia, unspecified: Secondary | ICD-10-CM | POA: Diagnosis present

## 2014-03-24 DIAGNOSIS — Z7982 Long term (current) use of aspirin: Secondary | ICD-10-CM | POA: Diagnosis not present

## 2014-03-24 DIAGNOSIS — Z87891 Personal history of nicotine dependence: Secondary | ICD-10-CM

## 2014-03-24 DIAGNOSIS — R799 Abnormal finding of blood chemistry, unspecified: Secondary | ICD-10-CM | POA: Diagnosis not present

## 2014-03-24 DIAGNOSIS — R4182 Altered mental status, unspecified: Secondary | ICD-10-CM | POA: Diagnosis not present

## 2014-03-24 DIAGNOSIS — R509 Fever, unspecified: Secondary | ICD-10-CM | POA: Diagnosis not present

## 2014-03-24 DIAGNOSIS — R251 Tremor, unspecified: Secondary | ICD-10-CM

## 2014-03-24 DIAGNOSIS — R778 Other specified abnormalities of plasma proteins: Secondary | ICD-10-CM

## 2014-03-24 DIAGNOSIS — G4700 Insomnia, unspecified: Secondary | ICD-10-CM

## 2014-03-24 LAB — COMPREHENSIVE METABOLIC PANEL
ALK PHOS: 82 U/L (ref 39–117)
ALT: 8 U/L (ref 0–35)
AST: 16 U/L (ref 0–37)
Albumin: 4.2 g/dL (ref 3.5–5.2)
Anion gap: 24 — ABNORMAL HIGH (ref 5–15)
BUN: 10 mg/dL (ref 6–23)
CO2: 17 mEq/L — ABNORMAL LOW (ref 19–32)
Calcium: 10.3 mg/dL (ref 8.4–10.5)
Chloride: 102 mEq/L (ref 96–112)
Creatinine, Ser: 0.72 mg/dL (ref 0.50–1.10)
GFR calc Af Amer: 87 mL/min — ABNORMAL LOW (ref >90)
GFR calc non Af Amer: 75 mL/min — ABNORMAL LOW (ref >90)
Glucose, Bld: 129 mg/dL — ABNORMAL HIGH (ref 70–99)
Potassium: 3.7 mEq/L (ref 3.7–5.3)
SODIUM: 143 meq/L (ref 137–147)
TOTAL PROTEIN: 7.6 g/dL (ref 6.0–8.3)
Total Bilirubin: 0.7 mg/dL (ref 0.3–1.2)

## 2014-03-24 LAB — URINALYSIS, ROUTINE W REFLEX MICROSCOPIC
Bilirubin Urine: NEGATIVE
Bilirubin Urine: NEGATIVE
GLUCOSE, UA: NEGATIVE mg/dL
Glucose, UA: NEGATIVE mg/dL
KETONES UR: NEGATIVE mg/dL
Ketones, ur: 15 mg/dL — AB
Leukocytes, UA: NEGATIVE
Nitrite: NEGATIVE
Nitrite: NEGATIVE
PH: 8 (ref 5.0–8.0)
Protein, ur: NEGATIVE mg/dL
Protein, ur: NEGATIVE mg/dL
Specific Gravity, Urine: 1.024 (ref 1.005–1.030)
Specific Gravity, Urine: 1.012 (ref 1.005–1.030)
UROBILINOGEN UA: 0.2 mg/dL (ref 0.0–1.0)
Urobilinogen, UA: 0.2 mg/dL (ref 0.0–1.0)
pH: 5 (ref 5.0–8.0)

## 2014-03-24 LAB — I-STAT VENOUS BLOOD GAS, ED
Acid-base deficit: 1 mmol/L (ref 0.0–2.0)
Bicarbonate: 19 mEq/L — ABNORMAL LOW (ref 20.0–24.0)
O2 SAT: 72 %
PH VEN: 7.573 — AB (ref 7.250–7.300)
PO2 VEN: 31 mmHg (ref 30.0–45.0)
TCO2: 20 mmol/L (ref 0–100)
pCO2, Ven: 20.6 mmHg — ABNORMAL LOW (ref 45.0–50.0)

## 2014-03-24 LAB — CBC WITH DIFFERENTIAL/PLATELET
BASOS PCT: 0 % (ref 0–1)
Basophils Absolute: 0 10*3/uL (ref 0.0–0.1)
EOS ABS: 0 10*3/uL (ref 0.0–0.7)
Eosinophils Relative: 0 % (ref 0–5)
HEMATOCRIT: 46.3 % — AB (ref 36.0–46.0)
HEMOGLOBIN: 15 g/dL (ref 12.0–15.0)
LYMPHS ABS: 0.9 10*3/uL (ref 0.7–4.0)
Lymphocytes Relative: 7 % — ABNORMAL LOW (ref 12–46)
MCH: 28.8 pg (ref 26.0–34.0)
MCHC: 32.4 g/dL (ref 30.0–36.0)
MCV: 89 fL (ref 78.0–100.0)
MONO ABS: 0.6 10*3/uL (ref 0.1–1.0)
Monocytes Relative: 5 % (ref 3–12)
Neutro Abs: 11.3 10*3/uL — ABNORMAL HIGH (ref 1.7–7.7)
Neutrophils Relative %: 88 % — ABNORMAL HIGH (ref 43–77)
Platelets: 290 10*3/uL (ref 150–400)
RBC: 5.2 MIL/uL — AB (ref 3.87–5.11)
RDW: 13.5 % (ref 11.5–15.5)
WBC: 12.8 10*3/uL — ABNORMAL HIGH (ref 4.0–10.5)

## 2014-03-24 LAB — URINE MICROSCOPIC-ADD ON

## 2014-03-24 LAB — I-STAT CG4 LACTIC ACID, ED: Lactic Acid, Venous: 6.53 mmol/L — ABNORMAL HIGH (ref 0.5–2.2)

## 2014-03-24 LAB — T4, FREE: FREE T4: 1.44 ng/dL (ref 0.80–1.80)

## 2014-03-24 LAB — LIPASE, BLOOD: Lipase: 51 U/L (ref 11–59)

## 2014-03-24 LAB — TSH: TSH: 3.35 u[IU]/mL (ref 0.350–4.500)

## 2014-03-24 MED ORDER — MELATONIN 5 MG PO TABS: 5.0000 mg | ORAL_TABLET | Freq: Every day | ORAL | Status: DC

## 2014-03-24 MED ORDER — LORAZEPAM 2 MG/ML IJ SOLN: 0.5000 mg | INTRAMUSCULAR | Status: DC | PRN

## 2014-03-24 MED ORDER — VANCOMYCIN HCL IN DEXTROSE 1-5 GM/200ML-% IV SOLN
1000.0000 mg | INTRAVENOUS | Status: DC
  Administered 2014-03-25 – 2014-03-26 (×2): 1000 mg via INTRAVENOUS
  Filled 2014-03-24 (×4): qty 200

## 2014-03-24 MED ORDER — ATORVASTATIN CALCIUM 10 MG PO TABS
10.0000 mg | ORAL_TABLET | Freq: Every day | ORAL | Status: DC
  Administered 2014-03-25 – 2014-03-27 (×3): 10 mg via ORAL
  Filled 2014-03-24 (×3): qty 1

## 2014-03-24 MED ORDER — SODIUM CHLORIDE 0.9 % IV SOLN
1000.0000 mL | Freq: Once | INTRAVENOUS | Status: AC
  Administered 2014-03-24: 1000 mL via INTRAVENOUS

## 2014-03-24 MED ORDER — CLONAZEPAM 0.5 MG PO TABS
0.5000 mg | ORAL_TABLET | Freq: Once | ORAL | Status: AC
  Administered 2014-03-24: 0.5 mg via ORAL
  Filled 2014-03-24: qty 1

## 2014-03-24 MED ORDER — ACETAMINOPHEN 325 MG PO TABS: 325.0000 mg | ORAL_TABLET | Freq: Once | ORAL | Status: DC

## 2014-03-24 MED ORDER — LEVOTHYROXINE SODIUM 50 MCG PO TABS
50.0000 ug | ORAL_TABLET | Freq: Every day | ORAL | Status: DC
  Administered 2014-03-25 – 2014-03-27 (×3): 50 ug via ORAL
  Filled 2014-03-24 (×4): qty 1

## 2014-03-24 MED ORDER — LORAZEPAM 2 MG/ML IJ SOLN
0.5000 mg | INTRAMUSCULAR | Status: DC | PRN
  Administered 2014-03-25: 0.5 mg via INTRAVENOUS
  Filled 2014-03-24: qty 1

## 2014-03-24 MED ORDER — ACETAMINOPHEN 650 MG RE SUPP: 650.0000 mg | Freq: Four times a day (QID) | RECTAL | Status: DC | PRN

## 2014-03-24 MED ORDER — ONDANSETRON HCL 4 MG PO TABS: 4.0000 mg | ORAL_TABLET | Freq: Four times a day (QID) | ORAL | Status: DC | PRN

## 2014-03-24 MED ORDER — DEXTROSE 5 % IV SOLN
10.0000 mg/kg | Freq: Two times a day (BID) | INTRAVENOUS | Status: DC
  Filled 2014-03-24: qty 12

## 2014-03-24 MED ORDER — ENOXAPARIN SODIUM 40 MG/0.4ML ~~LOC~~ SOLN
40.0000 mg | SUBCUTANEOUS | Status: DC
  Administered 2014-03-25: 40 mg via SUBCUTANEOUS
  Filled 2014-03-24: qty 0.4

## 2014-03-24 MED ORDER — ONDANSETRON HCL 4 MG/2ML IJ SOLN: 4.0000 mg | Freq: Four times a day (QID) | INTRAMUSCULAR | Status: DC | PRN

## 2014-03-24 MED ORDER — DEXTROSE 5 % IV SOLN
2.0000 g | Freq: Two times a day (BID) | INTRAVENOUS | Status: DC
  Administered 2014-03-25 (×3): 2 g via INTRAVENOUS
  Filled 2014-03-24 (×5): qty 2

## 2014-03-24 MED ORDER — VANCOMYCIN HCL 10 G IV SOLR
1250.0000 mg | Freq: Once | INTRAVENOUS | Status: AC
  Administered 2014-03-24: 1250 mg via INTRAVENOUS
  Filled 2014-03-24 (×2): qty 1250

## 2014-03-24 MED ORDER — AMLODIPINE BESYLATE 5 MG PO TABS
5.0000 mg | ORAL_TABLET | Freq: Every day | ORAL | Status: DC
  Administered 2014-03-25 – 2014-03-27 (×3): 5 mg via ORAL
  Filled 2014-03-24 (×3): qty 1

## 2014-03-24 MED ORDER — SODIUM CHLORIDE 0.9 % IJ SOLN
3.0000 mL | Freq: Two times a day (BID) | INTRAMUSCULAR | Status: DC
  Administered 2014-03-26 (×2): 3 mL via INTRAVENOUS

## 2014-03-24 MED ORDER — PIPERACILLIN-TAZOBACTAM 3.375 G IVPB 30 MIN
3.3750 g | Freq: Once | INTRAVENOUS | Status: AC
  Administered 2014-03-24: 3.375 g via INTRAVENOUS
  Filled 2014-03-24: qty 50

## 2014-03-24 MED ORDER — SODIUM CHLORIDE 0.9 % IV SOLN
1000.0000 mL | INTRAVENOUS | Status: DC
  Administered 2014-03-24: 1000 mL via INTRAVENOUS

## 2014-03-24 MED ORDER — ACETAMINOPHEN 325 MG PO TABS
650.0000 mg | ORAL_TABLET | Freq: Once | ORAL | Status: AC
  Administered 2014-03-24: 650 mg via ORAL

## 2014-03-24 MED ORDER — SODIUM CHLORIDE 0.9 % IV SOLN
INTRAVENOUS | Status: DC
  Administered 2014-03-24 – 2014-03-26 (×3): via INTRAVENOUS

## 2014-03-24 MED ORDER — CLONAZEPAM 0.5 MG PO TABS
0.5000 mg | ORAL_TABLET | Freq: Three times a day (TID) | ORAL | Status: DC | PRN
  Administered 2014-03-24 – 2014-03-25 (×2): 0.5 mg via ORAL
  Filled 2014-03-24 (×3): qty 1

## 2014-03-24 MED ORDER — ACETAMINOPHEN 325 MG PO TABS: 650.0000 mg | ORAL_TABLET | Freq: Four times a day (QID) | ORAL | Status: DC | PRN

## 2014-03-24 MED ORDER — DEXTROSE 5 % IV SOLN
400.0000 mg | Freq: Two times a day (BID) | INTRAVENOUS | Status: DC
  Administered 2014-03-24 – 2014-03-25 (×3): 400 mg via INTRAVENOUS
  Filled 2014-03-24 (×5): qty 8

## 2014-03-24 MED ORDER — ASPIRIN EC 81 MG PO TBEC
81.0000 mg | DELAYED_RELEASE_TABLET | Freq: Every day | ORAL | Status: DC
  Administered 2014-03-25 – 2014-03-27 (×3): 81 mg via ORAL
  Filled 2014-03-24 (×3): qty 1

## 2014-03-24 MED ORDER — SODIUM CHLORIDE 0.9 % IV BOLUS (SEPSIS)
1000.0000 mL | Freq: Once | INTRAVENOUS | Status: AC
  Administered 2014-03-24: 1000 mL via INTRAVENOUS

## 2014-03-24 NOTE — ED Notes (Signed)
family at [facility]. Pt on BP. 30 minutes of confusion and inability to understand BP. aggitation about needing to go to the b/r and inability to understand BP with constant direction and orientation. Erroneous BP d/t confusion and restlessness, options explained to pt& family.

## 2014-03-24 NOTE — H&P (Signed)
[facility] History and Physical  Patient: Brianna Reynolds  WUJ:[id number]  DOB: 03/13/1924  DOS: the patient was seen and examined on 03/24/2014 PCP: Lucilla Edin, MD  Chief Complaint: Confusion  HPI: Brianna Reynolds is a 80 y.o. female with Past medical history of dementia, fatigue, hypothyroidism, anxiety, hypertension. Patient presented with episode of confusion. History was obtained from patient's daughter. Patient has been having progressively worsening confusion since last Friday. Today since her symptoms are worsening she was taken to urgent care where she started having shaking episode as per the daughter with agitation and restlessness. There was no focal deficit but there was an episode of slurred speech. Patient continues to complain of "feeling miserable and please let me die". At her baseline patient is significantly independent and is able to carry her own independent daily living. There is no chest pain shortness of breath fever chills nausea vomiting diarrhea burning urination or incontinence of bowel or bladder. No recent fall or trauma or injury reported. As per the daughter the patient has been having episodes of similar confusion lasting for few days and resolving on its own which has improved with introduction of seroquel. Patient has been on Seroquel and Zoloft since long and no recent change in her medication.  The patient is coming from home. And at her baseline independent for most of her ADL.  Review of Systems: as mentioned in the history of present illness.  A Comprehensive review of the other systems is negative.  Past Medical History  Diagnosis Date  . Abnormal EKG   . Fatigue   . Hyperlipidemia   . Hypothyroid   . HTN (hypertension)     New  . Cataract   . Anxiety    Past Surgical History  Procedure Laterality Date  . Thyroidectomy, partial    . Parathyroidectomy    . Breast surgery     Social History:  reports that she quit smoking  about 23 years ago. She does not have any smokeless tobacco history on file. She reports that she does not drink alcohol or use illicit drugs.  Allergies  Allergen Reactions  . Penicillins Rash    Family History  Problem Relation Age of Onset  . Arthritis Mother     Prior to Admission medications   Medication Sig Start Date End Date Taking? Authorizing Provider  amLODipine (NORVASC) 5 MG tablet Take 5 mg by mouth daily.   Yes Historical Provider, MD  aspirin EC 81 MG tablet Take 81 mg by mouth daily.   Yes Historical Provider, MD  atorvastatin (LIPITOR) 10 MG tablet Take 10 mg by mouth daily.   Yes Historical Provider, MD  Calcium Carbonate (CALTRATE 600 PO) Take 1 tablet by mouth daily.   Yes Historical Provider, MD  Cholecalciferol (VITAMIN D) 2000 UNITS CAPS Take 1 capsule by mouth daily.    Yes Historical Provider, MD  clonazePAM (KLONOPIN) 0.5 MG tablet Take 0.5 mg by mouth 3 (three) times daily as needed for anxiety.   Yes Historical Provider, MD  fish oil-omega-3 fatty acids 1000 MG capsule Take 1 g by mouth daily.    Yes Historical Provider, MD  levothyroxine (SYNTHROID, LEVOTHROID) 50 MCG tablet Take 50 mcg by mouth daily before breakfast.   Yes Historical Provider, MD  Melatonin 5 MG TABS Take 5 mg by mouth at bedtime.   Yes Historical Provider, MD  Multiple Vitamins-Minerals (PRESERVISION AREDS) CAPS Take 1 capsule by mouth daily.   Yes Historical Provider, MD  QUEtiapine (SEROQUEL) 25 MG tablet Take 50 mg by mouth at bedtime.   Yes Historical Provider, MD  sertraline (ZOLOFT) 50 MG tablet Take 50 mg by mouth daily.   Yes Historical Provider, MD    Physical Exam: Filed Vitals:   03/24/14 2030 03/24/14 2045 03/24/14 2100 03/24/14 2141  BP: 116/72 134/30 132/86   Pulse: 41 90 168   Temp:    100.5 F (38.1 C)  TempSrc:    Rectal  Resp: SpO2: 90% 93% 93%     General: Alert, Awake and not oriented to Time, Place and Person. Appear in mild distress, able to  follow complex commands Eyes: PERRL ENT: Oral Mucosa clear dry. Neck: No  JVD Cardiovascular: S1 and S2 Present, no  Murmur, Peripheral Pulses Present Respiratory: Bilateral Air entry equal and Decreased, bilateral basal Crackles, no  wheezes Abdomen: Bowel Sound Present, Soft and Non tender Skin: No  Rash Extremities: Trace  Pedal edema, no  calf tenderness Neurologic: Grossly no focal neuro deficit. No hyperreflexia, tremors at present at rest, no spontaneous clonus, no ocular clonus  Labs on Admission:  CBC:  Recent Labs Lab 03/24/14 1650  WBC 12.8*  NEUTROABS 11.3*  HGB 15.0  HCT 46.3*  MCV 89.0  PLT 290    CMP     Component Value Date/Time   NA 143 03/24/2014 1650   K 3.7 03/24/2014 1650   CL 102 03/24/2014 1650   CO2 17* 03/24/2014 1650   GLUCOSE 129* 03/24/2014 1650   BUN 10 03/24/2014 1650   CREATININE 0.72 03/24/2014 1650   CREATININE 0.72 11/12/2013 1047   CALCIUM 10.3 03/24/2014 1650   CALCIUM 10.6* 03/14/2012 1317   PROT 7.6 03/24/2014 1650   ALBUMIN 4.2 03/24/2014 1650   AST 16 03/24/2014 1650   ALT 8 03/24/2014 1650   ALKPHOS 82 03/24/2014 1650   BILITOT 0.7 03/24/2014 1650   GFRNONAA 75* 03/24/2014 1650   GFRNONAA 76 11/12/2013 1047   GFRAA 87* 03/24/2014 1650   GFRAA 87 11/12/2013 1047     Recent Labs Lab 03/24/14 1650  LIPASE 51   No results found for this basename: AMMONIA,  in the last 168 hours  No results found for this basename: CKTOTAL, CKMB, CKMBINDEX, TROPONINI,  in the last 168 hours BNP (last 3 results) No results found for this basename: PROBNP,  in the last 8760 hours  Radiological Exams on Admission: Ct Head Wo Contrast  03/24/2014   CLINICAL DATA:  Altered mental status.  Confused.  EXAM: CT HEAD WITHOUT CONTRAST  TECHNIQUE: Contiguous axial images were obtained from the base of the skull through the vertex without intravenous contrast.  COMPARISON:  03/20/2010.  FINDINGS: Diffusely enlarged ventricles and subarachnoid spaces. Patchy white  matter low density in both cerebral hemispheres. No intracranial hemorrhage, mass lesion or CT evidence of acute infarction. Unremarkable bones and paranasal sinuses.  IMPRESSION: No acute abnormality. Mildly progressive atrophy and chronic small vessel white matter ischemic changes.   Electronically Signed   By: Gordan Payment M.D.   On: 03/24/2014 18:30   Dg Chest Port 1 View  03/24/2014   CLINICAL DATA:  80 year old female with altered mental status and confusion. Initial encounter.  EXAM: PORTABLE CHEST - 1 VIEW  COMPARISON:  03/14/2012.  FINDINGS: Portable AP semi upright view at 1652 hrs. Lower lung volumes. Stable cardiac size and mediastinal contours. Visualized tracheal air column is within normal limits. No pneumothorax. Increased pulmonary vascularity but no overt edema.  No definite effusion. No consolidation.  IMPRESSION: Low lung volumes, otherwise no acute cardiopulmonary abnormality.   Electronically Signed   By: Augusto Gamble M.D.   On: 03/24/2014 17:03   Assessment/Plan Principal Problem:   Acute encephalopathy Active Problems:   HTN (hypertension)   Insomnia   Sepsis   Hypothyroid   Lactic acidosis   1. Acute encephalopathy The patient is presenting with acute change in mental status that has been progressively worsening over last few days. Patient has had episodes like this in the past. She is found to have mild leukocytosis and mild fever as well. She had some tremor-like movements but that does not appear to have any seizures. She presented with lactic acid levels of 6.53. Possible differential at present include CNS infection including viral and bacterial encephalitis, serotonin syndrome, CVA, sepsis-induced AMS possibly due to pneumonia, generalized anxiety disorder with behavioral issues, progressive dementia. CSF attempt was made by ER which was not successful due to possible scoliosis. Patient will be treated with vancomycin ceftriaxone and acyclovir. IV hydration will also  be provided. Recheck lactic acid level. EEG in the morning. Serial neuro checks, telemetry monitoring, MRI brain in the morning for further workup. CT of the head is negative.   2.Hypertension. Continue home medication.  3.Hypothyroidism. TSH is normal, follow free T4, continue Synthroid.  4.Anxiety. Continue with lorazepam at home dose as well as lorazepam as needed. Holding Seroquel and Zoloft in view of possible serotonin syndrome.  DVT Prophylaxis: subcutaneous Heparin Nutrition: N.p.o.  Code Status: Full  Family Communication: Daughter  was present at bedside, opportunity was given to ask question and all questions were answered satisfactorily at the time of interview. Disposition: Admitted to inpatient in telemetry unit.  Author: Lynden Oxford, MD [facility] Pager: [phone] 03/24/2014, 10:28 PM    If 7PM-7AM, please contact night-coverage [email or web address] Password [email or web address]  **Disclaimer: This note may have been dictated with voice recognition software. Similar sounding words can inadvertently be transcribed and this note may contain transcription errors which may not have been corrected upon publication of note.**

## 2014-03-24 NOTE — ED Provider Notes (Signed)
CSN: [id number]     Arrival date & time 03/24/14  1540 History   First MD Initiated Contact with Patient 03/24/14 1601     Chief Complaint  Patient presents with  . Altered Mental Status     (Consider location/radiation/quality/duration/timing/severity/associated sxs/prior Treatment) HPI  Brianna Reynolds is a 80 y.o. female  with a past medical history of fatigue, hypothyroidism, and anxiety, presenting for altered mental status. Per patient's family she was acting like her normal self earlier today, however after about 11 AM she started to seen shaky, confused, and was repeating herself. They took her to an urgent care center, where she waited approximately 2 hours to be seen, and they stated the patient appeared to get worse while waiting. Prior to today patient has not had a cough, sore throat, nasal congestion, chest pain, shortness of breath, fever, chills, nausea, vomiting, diarrhea, dysuria, or other significant review of systems to indicate a likely cause of her recent illness. However, patient's husband states that she is on multiple medications for anxiety and sleep, and at times appears more "out of it" which improves throughout the day after her medication start to wear off. During interview patient is unable to provide further information, only repeatedly stating statements such as "what do I do now", "let me die, let me die."     Past Medical History  Diagnosis Date  . Abnormal EKG   . Fatigue   . Hyperlipidemia   . Hypothyroid   . HTN (hypertension)     New  . Cataract   . Anxiety    Past Surgical History  Procedure Laterality Date  . Thyroidectomy, partial    . Parathyroidectomy    . Breast surgery     Family History  Problem Relation Age of Onset  . Arthritis Mother    History  Substance Use Topics  . Smoking status: Former Smoker    Quit date: 02/10/1991  . Smokeless tobacco: Not on file  . Alcohol Use: No   OB History   Grav Para Term Preterm  Abortions TAB SAB Ect Mult Living                 Review of Systems  Unable to perform ROS: Mental status change  Neurological: Positive for tremors and weakness.  Psychiatric/Behavioral: Positive for confusion.      Allergies  Penicillins  Home Medications   Prior to Admission medications   Medication Sig Start Date End Date Taking? Authorizing Provider  amLODipine (NORVASC) 5 MG tablet Take 5 mg by mouth daily.   Yes Historical Provider, MD  aspirin EC 81 MG tablet Take 81 mg by mouth daily.   Yes Historical Provider, MD  atorvastatin (LIPITOR) 10 MG tablet Take 10 mg by mouth daily.   Yes Historical Provider, MD  Calcium Carbonate (CALTRATE 600 PO) Take 1 tablet by mouth daily.   Yes Historical Provider, MD  Cholecalciferol (VITAMIN D) 2000 UNITS CAPS Take 1 capsule by mouth daily.    Yes Historical Provider, MD  clonazePAM (KLONOPIN) 0.5 MG tablet Take 0.5 mg by mouth 3 (three) times daily as needed for anxiety.   Yes Historical Provider, MD  levothyroxine (SYNTHROID, LEVOTHROID) 50 MCG tablet Take 50 mcg by mouth daily before breakfast.   Yes Historical Provider, MD  Melatonin 5 MG TABS Take 3 mg by mouth at bedtime.    Yes Historical Provider, MD  Multiple Vitamins-Minerals (PRESERVISION AREDS) CAPS Take 1 capsule by mouth daily.   Yes Historical  Provider, MD  QUEtiapine (SEROQUEL) 25 MG tablet Take 50 mg by mouth at bedtime.   Yes Historical Provider, MD  sertraline (ZOLOFT) 50 MG tablet Take 50 mg by mouth daily.   Yes Historical Provider, MD  fish oil-omega-3 fatty acids 1000 MG capsule Take 1 g by mouth daily.     Historical Provider, MD   BP 153/75  Pulse 105  Temp(Src) 98.6 F (37 C) (Oral)  Resp 26  SpO2 99% Physical Exam  Nursing note and vitals reviewed. Constitutional: She appears well-developed and well-nourished. She appears distressed.  HENT:  Head: Normocephalic and atraumatic.  Right Ear: External ear normal.  Left Ear: External ear normal.  Nose:  Nose normal.  Mouth/Throat: Oropharynx is clear and moist. No oropharyngeal exudate.  Eyes: Conjunctivae and EOM are normal. Pupils are equal, round, and reactive to light. Right eye exhibits no discharge. Left eye exhibits no discharge. No scleral icterus.  Neck: Normal range of motion. Neck supple. No JVD present. No tracheal deviation present. No thyromegaly present.  Cardiovascular: Regular rhythm, normal heart sounds and intact distal pulses.  Tachycardia present.  Exam reveals no gallop and no friction rub.   No murmur heard. Pulmonary/Chest: Effort normal. No stridor. No respiratory distress. She has no decreased breath sounds. She has no wheezes. She has no rhonchi. She has rales in the right lower field and the left lower field. She exhibits no tenderness.  Abdominal: Soft. Bowel sounds are normal. She exhibits no distension. There is no tenderness. There is no rebound and no guarding.  Musculoskeletal: She exhibits no edema and no tenderness.  Lymphadenopathy:    She has no cervical adenopathy.  Neurological: She is alert. She is disoriented. She displays tremor. No cranial nerve deficit or sensory deficit. GCS eye subscore is 4. GCS verbal subscore is 3. GCS motor subscore is 6.  Generalized weakness, difficulty following commands.  Repetitive speech.  Skin: Skin is warm and dry. No rash noted. She is not diaphoretic. No erythema. No pallor.  Psychiatric: Her mood appears anxious. Cognition and memory are impaired. She exhibits abnormal recent memory and abnormal remote memory.    ED Course  Procedures (including critical care time) Labs Review Labs Reviewed  URINALYSIS, ROUTINE W REFLEX MICROSCOPIC - Abnormal; Notable for the following:    Hgb urine dipstick TRACE (*)    Ketones, ur 15 (*)    Leukocytes, UA SMALL (*)    All other components within normal limits  COMPREHENSIVE METABOLIC PANEL - Abnormal; Notable for the following:    CO2 17 (*)    Glucose, Bld 129 (*)    GFR  calc non Af Amer 75 (*)    GFR calc Af Amer 87 (*)    Anion gap 24 (*)    All other components within normal limits  CBC WITH DIFFERENTIAL - Abnormal; Notable for the following:    WBC 12.8 (*)    RBC 5.20 (*)    HCT 46.3 (*)    Neutrophils Relative % 88 (*)    Neutro Abs 11.3 (*)    Lymphocytes Relative 7 (*)    All other components within normal limits  URINE MICROSCOPIC-ADD ON - Abnormal; Notable for the following:    Bacteria, UA FEW (*)    Crystals CA OXALATE CRYSTALS (*)    All other components within normal limits  URINALYSIS, ROUTINE W REFLEX MICROSCOPIC - Abnormal; Notable for the following:    Hgb urine dipstick SMALL (*)    All other  components within normal limits  I-STAT CG4 LACTIC ACID, ED - Abnormal; Notable for the following:    Lactic Acid, Venous 6.53 (*)    All other components within normal limits  I-STAT VENOUS BLOOD GAS, ED - Abnormal; Notable for the following:    pH, Ven 7.573 (*)    pCO2, Ven 20.6 (*)    Bicarbonate 19.0 (*)    All other components within normal limits  URINE CULTURE  CULTURE, BLOOD (ROUTINE X 2)  CULTURE, BLOOD (ROUTINE X 2)  CULTURE, EXPECTORATED SPUTUM-ASSESSMENT  LIPASE, BLOOD  T4, FREE  TSH  URINE MICROSCOPIC-ADD ON  LACTIC ACID, PLASMA  AMMONIA  BLOOD GAS, VENOUS  VITAMIN B12  FOLATE    Imaging Review Ct Head Wo Contrast  03/24/2014   CLINICAL DATA:  Altered mental status.  Confused.  EXAM: CT HEAD WITHOUT CONTRAST  TECHNIQUE: Contiguous axial images were obtained from the base of the skull through the vertex without intravenous contrast.  COMPARISON:  03/20/2010.  FINDINGS: Diffusely enlarged ventricles and subarachnoid spaces. Patchy white matter low density in both cerebral hemispheres. No intracranial hemorrhage, mass lesion or CT evidence of acute infarction. Unremarkable bones and paranasal sinuses.  IMPRESSION: No acute abnormality. Mildly progressive atrophy and chronic small vessel white matter ischemic changes.    Electronically Signed   By: Gordan Payment M.D.   On: 03/24/2014 18:30   Dg Chest Port 1 View  03/24/2014   CLINICAL DATA:  80 year old female with altered mental status and confusion. Initial encounter.  EXAM: PORTABLE CHEST - 1 VIEW  COMPARISON:  03/14/2012.  FINDINGS: Portable AP semi upright view at 1652 hrs. Lower lung volumes. Stable cardiac size and mediastinal contours. Visualized tracheal air column is within normal limits. No pneumothorax. Increased pulmonary vascularity but no overt edema. No definite effusion. No consolidation.  IMPRESSION: Low lung volumes, otherwise no acute cardiopulmonary abnormality.   Electronically Signed   By: Augusto Gamble M.D.   On: 03/24/2014 17:03     EKG Interpretation   Date/Time:  Monday March 24 2014 16:32:47 EDT Ventricular Rate:  105 PR Interval:  106 QRS Duration: 83 QT Interval:  431 QTC Calculation: 570 R Axis:   77 Text Interpretation:  Sinus tachycardia Borderline repolarization  abnormality Prolonged QT interval No significant change since last tracing  Confirmed by [facility]  MD, DAVID ([id number]) on 03/24/2014 7:38:51 PM      MDM   Final diagnoses:  Sepsis, due to unspecified organism  Acute encephalopathy    On exam patient is tremulous, warm to touch, making repetitive statements, only oriented to herself and able to identify her immediate family. Presentation appears most consistent with acute delirium, likely secondary to infectious, or metabolic etiology. Patient does appear to have an infection, based on the acute onset and her general appearance. Polypharmacy, or medication interactions are another potential cause.  Patient has a nonfocal neurologic exam, with generalized weakness, and her presentation does not appear consistent with an acute cerebrovascular event.  Will obtain basic labs, lactic acid, EKG, chest x-ray, head CT, and urine studies. Will wait on empiric antibiotics unless clear indication of infection is demonstrated.  Patient otherwise has stable vital signs and is afebrile upon arrival.  Mild tachycardia noted.  Laboratory workup reveals lactic acidosis, with positive anion gap. Venous blood gas shows mixed respiratory alkalosis, with metabolic acidosis. Possibly a component of anxiety exacerbating patient's respiratory drive. Patient given empiric antibiotics due to lactic acidosis, with associated elevated white blood cell count.  Patient  given IV hydration due to her lactic acidosis. Lumbar puncture attempted in the emergency department but unsuccessful with multiple attempts. Urinalysis, and chest x-ray do not reveal a clear source of infection, blood cultures drawn prior to antibiotic administration. Patient will be admitted for further management to the hospitalist service for sepsis of unknown source.  Patient care was discussed with my attending, Dr. Preston Fleeting.    Gavin Pound, MD 03/25/14 [phone]

## 2014-03-24 NOTE — ED Notes (Signed)
Pt. Began having increased confusion and weakness since Friday.  She also is having increased anxiety .  Vitals stable. CBG 144. Repetitive questioning.  Pt. Does not know if she is having pain.  Pt. Is also having incoherent speech.

## 2014-03-24 NOTE — Progress Notes (Signed)
[facility] [address] [place] [address] [phone]  Date:  03/24/2014   Name:  Brianna Reynolds   DOB:  16-Jul-1924   MRN:  [id number]  PCP:  Lucilla Edin, MD    Chief Complaint: Shaking, Memory Loss and Feels Bad   History of Present Illness:  Brianna Reynolds is a 80 y.o. very pleasant female patient who presents with the following:  Patient lives with her husband.  Does not drive.   Husband called daughter to have her brought here for evaluation of an acute mental status change.  She is not able to make sentences and has acute memory loss.   She has no motor complaints She says she "dosen't feel good" and denies nausea, vomiting or chest pain, fever or chills.  She denies shortness of breath or wheezing. Is unable to stand or walk according to the daughter. No improvement with over the counter medications or other home remedies.  Denies other complaint or health concern today.   Patient Active Problem List   Diagnosis Date Noted  . Insomnia 02/28/2013  . Abnormal serum protein electrophoresis 02/11/2013  . Abnormal EKG   . HTN (hypertension)   . Fatigue     Past Medical History  Diagnosis Date  . Abnormal EKG   . Fatigue   . Hyperlipidemia   . Hypothyroid   . HTN (hypertension)     New  . Cataract   . Anxiety     Past Surgical History  Procedure Laterality Date  . Thyroidectomy, partial    . Parathyroidectomy    . Breast surgery      History  Substance Use Topics  . Smoking status: Former Smoker    Quit date: 02/10/1991  . Smokeless tobacco: Not on file  . Alcohol Use: No    Family History  Problem Relation Age of Onset  . Arthritis Mother     Allergies  Allergen Reactions  . Penicillins Rash    Medication list has been reviewed and updated.  Current Outpatient Prescriptions on File Prior to Visit  Medication Sig Dispense Refill  . amLODipine (NORVASC) 5 MG tablet TAKE 1 TABLET ONCE DAILY.  90 tablet  1   . aspirin EC 81 MG tablet Take 81 mg by mouth daily.      Marland Kitchen atorvastatin (LIPITOR) 10 MG tablet TAKE ONE TABLET AT BEDTIME.  30 tablet  2  . Calcium Carbonate (CALTRATE 600 PO) Take 1 tablet by mouth daily.      . Cholecalciferol (VITAMIN D) 2000 UNITS CAPS Take 1 capsule by mouth daily.       . clonazePAM (KLONOPIN) 0.5 MG tablet TAKE 1 TABLET THREE TIMES DAILY AS NEEDED FOR ANXIETY.  90 tablet  5  . fish oil-omega-3 fatty acids 1000 MG capsule Take 1 g by mouth daily.       Marland Kitchen gabapentin (NEURONTIN) 100 MG capsule 1 by mouth each bedtime for one week to increase to 2 by mouth each bedtime  60 capsule  11  . Melatonin 3 MG CAPS Take 1 capsule by mouth at bedtime as needed. For sleep.      Marland Kitchen QUEtiapine (SEROQUEL) 25 MG tablet Take 2 tablets by mouth every day at bedtime .  60 tablet  11  . sertraline (ZOLOFT) 50 MG tablet TAKE 1 TABLET ONCE DAILY.  30 tablet  5  . SYNTHROID 50 MCG tablet TAKE 1 TABLET EACH DAY.  30 tablet  5  No current facility-administered medications on file prior to visit.    Review of Systems:  As per HPI, otherwise negative.    Physical Examination: Filed Vitals:   03/24/14 1423  BP: 124/68  Pulse: 100  Temp: 98.3 F (36.8 C)  Resp: 16   There were no vitals filed for this visit. There is no weight on file to calculate BMI. Ideal Body Weight:    GEN: elderly, NAD, Non-toxic, A & O x 3  Breathing rapidly and deeply HEENT: Atraumatic, Normocephalic. Neck supple. No masses, No LAD. Ears and Nose: No external deformity. CV: RRR, No M/G/R. No JVD. No thrill. No extra heart sounds. PULM: CTA B, no wheezes, crackles, rhonchi. No retractions. No resp. distress. No accessory muscle use. ABD: S, NT, ND, +BS. No rebound. No HSM. EXTR: No c/c/e NEURO unable to evaluate gait as patient is wheelchair bound. MAE.  Confused and disoriented. Resting tremor   Assessment and Plan: Acute mental status change Possible panic attack To ER via EMS   Signed,  Phillips Odor, MD

## 2014-03-24 NOTE — Progress Notes (Addendum)
ANTIBIOTIC CONSULT NOTE - INITIAL  Pharmacy Consult for acyclovir, vancomycin and ceftriaxone Indication: suspected encephalitis  Allergies  Allergen Reactions  . Penicillins Rash    Patient Measurements: Height:  (147.3 cm) Weight: 132 lb 4.4 oz (60 kg) IBW/kg (Calculated) : 40.9   Vital Signs: Temp: 100.5 F (38.1 C) (08/24 2141) Temp src: Rectal (08/24 2141) BP: 132/86 mmHg (08/24 2100) Pulse Rate: 168 (08/24 2100) Intake/Output from previous day:   Intake/Output from this shift: Total I/O In: 2250 [I.V.:2000; IV Piggyback:250] Out: 800 [Urine:800]  Labs:  Recent Labs  03/24/14 1650  WBC 12.8*  HGB 15.0  PLT 290  CREATININE 0.72   Estimated Creatinine Clearance: 37.9 ml/min (by C-G formula based on Cr of 0.72). No results found for this basename: VANCOTROUGH, VANCOPEAK, VANCORANDOM, GENTTROUGH, GENTPEAK, GENTRANDOM, TOBRATROUGH, TOBRAPEAK, TOBRARND, AMIKACINPEAK, AMIKACINTROU, AMIKACIN,  in the last 72 hours   Microbiology: No results found for this or any previous visit (from the past 720 hour(s)).  Medical History: Past Medical History  Diagnosis Date  . Abnormal EKG   . Fatigue   . Hyperlipidemia   . Hypothyroid   . HTN (hypertension)     New  . Cataract   . Anxiety     Assessment: 39 YOF with some baseline dementia brought in for evaluation of AMS changes. Had a lot of shaking today per family and patient keeps saying she feels terrible.  CT of head dose not show any acute abnormality.  WBC 12.8 with Tmax 101.5. Lactic acid significantly elevated at 6.53. SCr 0.72 with est CrCL ~35-88mL/min.   Goal of Therapy:  Vancomycin trough level 15-20 mcg/ml  Plan:  1. Ceftriaxone 2g IV q12h 2. Vancomycin  IV q24h (will also serve as a ~16mg /kg loading dose) 3. Acyclovir /kg =  IV q12h 4. Follow up c/s, clinical progression, imaging, renal function, trough at [facility]  Lauren D. Bajbus, PharmD, BCPS Clinical Pharmacist Pager:  [phone] 03/24/2014 10:43 PM     ADDENDUM: Will change acyclovir to  IV Q12H using IBW.  Vernard Gambles, PharmD, BCPS 03/24/2014 10:51 PM

## 2014-03-24 NOTE — ED Notes (Signed)
Pt moved to POD C 29. No changes. Calmer, NAD, alert, interactive, tolerating foley. Resting comfortably. familly at [facility]. Admitting Dr. Allena Katz into room.

## 2014-03-24 NOTE — ED Notes (Signed)
Phlebotomy at bedside.

## 2014-03-24 NOTE — ED Notes (Signed)
I Stat Lactic Acid results shown to Dr. D Glick 

## 2014-03-24 NOTE — ED Provider Notes (Signed)
80 year old female with underlying dementia was noted to have some shaking 3 days ago. Today, she has had much more shaking and had a sudden change of mentation. She is very confused and keeps saying she feels terrible and wants to die. Family states that she had been eating and drinking normally up until today. There is no known fever no cough and no vomiting or diarrhea. Exam, patient appears somewhat uncomfortable. She is mildly tachycardic. Skin is not diaphoretic. Lungs are clear heart has regular rate and rhythm. She demonstrates perseveration constantly saying that she feels terrible and that she wants to die. She'll be evaluated for occult infection.  Urinalysis and chest x-ray are unremarkable. Lactic acid levels come back very high issues given aggressive IV hydration. She was started on empiric antibiotics and I suspect that there might be an occult pneumonia. Repeat lactic acid level has normalized. Arrangements are made for hospital admission.  CRITICAL CARE Performed by: Dione Booze Total critical care time: 45 minutes Critical care time was exclusive of separately billable procedures and treating other patients. Critical care was necessary to treat or prevent imminent or life-threatening deterioration. Critical care was time spent personally by me on the following activities: development of treatment plan with patient and/or surrogate as well as nursing, discussions with consultants, evaluation of patient's response to treatment, examination of patient, obtaining history from patient or surrogate, ordering and performing treatments and interventions, ordering and review of laboratory studies, ordering and review of radiographic studies, pulse oximetry and re-evaluation of patient's condition.   I saw and evaluated the patient, reviewed the resident's note and I agree with the findings and plan.    Dione Booze, MD 03/25/14 [phone]

## 2014-03-24 NOTE — ED Notes (Signed)
Dr. Celene Kras in to explain plan, wait, process, foley, admission.

## 2014-03-25 ENCOUNTER — Encounter (HOSPITAL_COMMUNITY): Payer: Self-pay | Admitting: General Practice

## 2014-03-25 ENCOUNTER — Inpatient Hospital Stay (HOSPITAL_COMMUNITY): Payer: Medicare Other

## 2014-03-25 DIAGNOSIS — E039 Hypothyroidism, unspecified: Secondary | ICD-10-CM | POA: Diagnosis present

## 2014-03-25 DIAGNOSIS — E872 Acidosis, unspecified: Secondary | ICD-10-CM | POA: Diagnosis present

## 2014-03-25 DIAGNOSIS — R799 Abnormal finding of blood chemistry, unspecified: Secondary | ICD-10-CM

## 2014-03-25 LAB — LACTIC ACID, PLASMA: Lactic Acid, Venous: 1.3 mmol/L (ref 0.5–2.2)

## 2014-03-25 LAB — VITAMIN B12: VITAMIN B 12: 233 pg/mL (ref 211–911)

## 2014-03-25 LAB — FOLATE: FOLATE: 5 ng/mL

## 2014-03-25 LAB — AMMONIA: Ammonia: 17 umol/L (ref 11–60)

## 2014-03-25 MED ORDER — ENOXAPARIN SODIUM 30 MG/0.3ML ~~LOC~~ SOLN
30.0000 mg | SUBCUTANEOUS | Status: DC
  Administered 2014-03-26 – 2014-03-27 (×2): 30 mg via SUBCUTANEOUS
  Filled 2014-03-25 (×2): qty 0.3

## 2014-03-25 NOTE — Progress Notes (Signed)
Utilization review completed.  

## 2014-03-25 NOTE — Progress Notes (Signed)
[facility] PROGRESS NOTE  Brianna Reynolds WUJ:[id number] DOB: [birth year] DOA: 03/24/2014 PCP: Lucilla Edin, MD  Assessment/Plan: 1. Acute encephalopathy  -The patient is presenting with acute change in mental status that has been progressively worsening over last few days.  -Patient has had episodes like this in the past.  -She is found to have mild leukocytosis and tmax of 101 -family reported UE shaking with repetitive speech -She presented with lactic acid levels of 6.53.   -CSF attempt was made by ER which was not successful due to possible scoliosis.  -Started on empiric vancomycin ceftriaxone and acyclovir.  -lactic acid level much improved overnight -EEG with mild generalized continuous nonspecific slowing of cerebral activity -MRI brain in the morning for further workup.  -CT of the head is negative.  - Questioned seizures or need for LP so Neurology consulted. No recs for LP, but recs to continue abx 2.Hypertension.  -Continue home medication -Controlled 3.Hypothyroidism.  -TSH is normal -follow free T4 -continue Synthroid.  Code Status: Full Family Communication: Pt in room, family at bedside Disposition Plan: Pending  Consultants:  Neurology  Procedures:  Attempted bedside LP, failed in ED  Antibiotics:  Vanc 8/25>>>  Rocephin 8/25>>>  Acyclovir 8/25>>>  HPI/Subjective: Family reports pt seems better today. No complaints.  Objective: Filed Vitals:   03/25/14 0130 03/25/14 0330 03/25/14 0627 03/25/14 1320  BP: 108/54 110/56 114/41 107/43  Pulse: 76 77 82 71  Temp: 99.2 F (37.3 C) 98.9 F (37.2 C) 99.1 F (37.3 C) 98.4 F (36.9 C)  TempSrc: Oral Oral Oral Oral  Resp: Height:  (1.473 m)     Weight: 60 kg (132 lb 4.4 oz)  60 kg (132 lb 4.4 oz)   SpO2: 92% 94% 96% 94%    Intake/Output Summary (Last 24 hours) at 03/25/14 1420 Last data filed at 03/25/14 0100  Gross per 24 hour  Intake   2250 ml  Output   1125 ml   Net   1125 ml   Filed Weights   03/24/14 2141 03/25/14 0130 03/25/14 0627  Weight: 60 kg (132 lb 4.4 oz) 60 kg (132 lb 4.4 oz) 60 kg (132 lb 4.4 oz)    Exam:   General:  Awake, in nad  Cardiovascular: regular, s1, s2  Respiratory: normal resp effort, no wheezing  Abdomen: soft,nondistended  Musculoskeletal: perfused, no clubbing   Data Reviewed: Basic Metabolic Panel:  Recent Labs Lab 03/24/14 1650  NA 143  K 3.7  CL 102  CO2 17*  GLUCOSE 129*  BUN 10  CREATININE 0.72  CALCIUM 10.3   Liver Function Tests:  Recent Labs Lab 03/24/14 1650  AST 16  ALT 8  ALKPHOS 82  BILITOT 0.7  PROT 7.6  ALBUMIN 4.2    Recent Labs Lab 03/24/14 1650  LIPASE 51    Recent Labs Lab 03/24/14 2331  AMMONIA 17   CBC:  Recent Labs Lab 03/24/14 1650  WBC 12.8*  NEUTROABS 11.3*  HGB 15.0  HCT 46.3*  MCV 89.0  PLT 290   Cardiac Enzymes: No results found for this basename: CKTOTAL, CKMB, CKMBINDEX, TROPONINI,  in the last 168 hours BNP (last 3 results) No results found for this basename: PROBNP,  in the last 8760 hours CBG: No results found for this basename: GLUCAP,  in the last 168 hours  Recent Results (from the past 240 hour(s))  CULTURE, BLOOD (ROUTINE X 2)     Status: None  Collection Time    03/24/14  5:20 PM      Result Value Ref Range Status   Specimen Description BLOOD LEFT ARM   Final   Special Requests BOTTLES DRAWN AEROBIC AND ANAEROBIC 10CC   Final   Culture  Setup Time     Final   Value: 03/24/2014 22:37     Performed at [facility]   Culture     Final   Value:        BLOOD CULTURE RECEIVED NO GROWTH TO DATE CULTURE WILL BE HELD FOR 5 DAYS BEFORE ISSUING A FINAL NEGATIVE REPORT     Performed at [facility]   Report Status PENDING   Incomplete  CULTURE, BLOOD (ROUTINE X 2)     Status: None   Collection Time    03/24/14  5:35 PM      Result Value Ref Range Status   Specimen Description BLOOD RIGHT HAND   Final    Special Requests BOTTLES DRAWN AEROBIC ONLY 10CC   Final   Culture  Setup Time     Final   Value: 03/24/2014 22:36     Performed at [facility]   Culture     Final   Value: GRAM POSITIVE COCCI IN PAIRS     Note: Gram Stain Report Called to,Read Back By and Verified With: Charlyne Mom  ON [id number] BY [facility]     Performed at [facility]   Report Status PENDING   Incomplete     Studies: Ct Head Wo Contrast  03/24/2014   CLINICAL DATA:  Altered mental status.  Confused.  EXAM: CT HEAD WITHOUT CONTRAST  TECHNIQUE: Contiguous axial images were obtained from the base of the skull through the vertex without intravenous contrast.  COMPARISON:  03/20/2010.  FINDINGS: Diffusely enlarged ventricles and subarachnoid spaces. Patchy white matter low density in both cerebral hemispheres. No intracranial hemorrhage, mass lesion or CT evidence of acute infarction. Unremarkable bones and paranasal sinuses.  IMPRESSION: No acute abnormality. Mildly progressive atrophy and chronic small vessel white matter ischemic changes.   Electronically Signed   By: Gordan Payment M.D.   On: 03/24/2014 18:30   [name]  03/25/2014   CLINICAL DATA:  80 year old female with altered mental status, confusion, tremor. Initial encounter.  EXAM: MRI HEAD WITHOUT CONTRAST  TECHNIQUE: Multiplanar, multiecho pulse sequences of the brain and surrounding structures were obtained without intravenous contrast.  COMPARISON:  Head CTs without contrast 03/24/2014 and earlier. [place] [facility] brain MRI 12/26/2006.  FINDINGS: Only mild generalized cerebral volume loss since 2008. Major intracranial vascular flow voids are stable. No restricted diffusion to suggest acute infarction. No midline shift, mass effect, evidence of mass lesion, ventriculomegaly, extra-axial collection or acute intracranial hemorrhage. Cervicomedullary junction and pituitary are within normal limits. Negative visualized cervical spine.  Normal bone marrow signal.  Chronic patchy cerebral white matter T2 and FLAIR hyperintensity, nonspecific and mostly periatrial. No significant change since 2008. No cortical encephalomalacia. Mild for age T2 heterogeneity in the deep gray matter nuclei. Brainstem and cerebellum are within normal limits for age. Motion artifact degrades T2 * imaging today.  Grossly normal visualized internal auditory structures. Mastoids are clear. Minor paranasal sinus mucosal thickening. Postoperative changes to the globes. Visualized scalp soft tissues are within normal limits. Normal bone marrow signal.  IMPRESSION: No acute intracranial abnormality.  No significant change in the non contrast MRI appearance of the brain since 2008, with mild to moderate nonspecific white  matter signal abnormality.   Electronically Signed   By: Augusto Gamble M.D.   On: 03/25/2014 12:27   Dg Chest Port 1 View  03/24/2014   CLINICAL DATA:  80 year old female with altered mental status and confusion. Initial encounter.  EXAM: PORTABLE CHEST - 1 VIEW  COMPARISON:  03/14/2012.  FINDINGS: Portable AP semi upright view at 1652 hrs. Lower lung volumes. Stable cardiac size and mediastinal contours. Visualized tracheal air column is within normal limits. No pneumothorax. Increased pulmonary vascularity but no overt edema. No definite effusion. No consolidation.  IMPRESSION: Low lung volumes, otherwise no acute cardiopulmonary abnormality.   Electronically Signed   By: Augusto Gamble M.D.   On: 03/24/2014 17:03    Scheduled Meds: . acyclovir  400 mg Intravenous Q12H  . amLODipine  5 mg Oral Daily  . aspirin EC  81 mg Oral Daily  . atorvastatin  10 mg Oral Daily  . cefTRIAXone (ROCEPHIN)  IV  2 g Intravenous Q12H  . [START ON 03/26/2014] enoxaparin (LOVENOX) injection  30 mg Subcutaneous Q24H  . levothyroxine  50 mcg Oral QAC breakfast  . sodium chloride  3 mL Intravenous Q12H  . vancomycin  1,000 mg Intravenous Q24H   Continuous Infusions: . sodium  chloride 100 mL/hr at 03/24/14 2355    Principal Problem:   Acute encephalopathy Active Problems:   HTN (hypertension)   Insomnia   Sepsis   Hypothyroid   Lactic acidosis  Time spent:   CHIU, STEPHEN K  [facility] Pager [phone]. If 7PM-7AM, please contact night-coverage at [email or web address], password [facility] 03/25/2014, 2:20 PM  LOS: 1 day

## 2014-03-25 NOTE — Procedures (Signed)
ELECTROENCEPHALOGRAM REPORT  Patient: Brianna Reynolds       Room #: 1O10  EEG No. ID: [id number] Age: 80 y.o.        Sex: female Referring Physician: Rhona Leavens Report Date:  03/25/2014        Interpreting Physician: Aline Brochure  History: Brianna Reynolds is an 80 y.o. female with a history of dementia who presented with increasing confusion and agitation.  Indications for study:  Rule out slowing or seizure activity.  Technique: This is an 18 channel routine scalp EEG performed at the bedside with bipolar and monopolar montages arranged in accordance to the international 10/20 system of electrode placement.   Description: This EEG recording was performed during wakefulness and during light sleep. Dominant background activity during wakefulness consisted of mixed low amplitude diffuse irregular delta activity as well as to 6 Hz largely rhythmic theta activity. Photic stimulation and hyperventilation were not performed. It was further slowing of background activity during light sleep. Occasional sleep spindles and vertex waves are recorded during brief periods of stage II sleep. No epileptiform discharges recorded.  Interpretation: This EEG is abnormal with mild generalized continuous nonspecific slowing of cerebral activity. This pattern of slowing can be seen with degenerative as well as toxic/metabolic encephalopathies. No evidence of an epileptic disorder is seen.   Venetia Maxon M.D. [facility] [phone]

## 2014-03-25 NOTE — Progress Notes (Signed)
EEG completed; results pending.    

## 2014-03-25 NOTE — Consult Note (Signed)
NEURO HOSPITALIST CONSULT NOTE    Reason for Consult: repetitive speech and tremor  HPI:                                                                                                                                          Brianna Reynolds is an 80 y.o. female with Past medical history of dementia, fatigue, hypothyroidism, anxiety, hypertension.  Per chart patient presented with episode of confusion. History was obtained from patient's daughter. Patient has been having progressively worsening confusion since last Friday. Today since her symptoms are worsening she was taken to urgent care where she started having shaking episode as per the daughter with agitation and restlessness. There was no focal deficit but there was an episode of slurred speech. During history on initial exam patient continued to complain of "feeling miserable and please let me die".   On arrival to ED patient was afebrile, later that night patient spiked a Temperature of 101.5.   CSF attempt was made by ER which was not successful due to possible scoliosis. Patietn was  treated with vancomycin ceftriaxone and acyclovir and temperature has  improved with latest Temperature for 99.1.  Looking back in chart patient has had tremors, anxiety and problems with fixating of issues  since 09/2012    Neuro medications: Klonopin Seroquel-for sleep Zoloft    Past Medical History  Diagnosis Date  . Abnormal EKG   . Fatigue   . Hyperlipidemia   . Hypothyroid   . HTN (hypertension)     New  . Cataract   . Anxiety     Past Surgical History  Procedure Laterality Date  . Thyroidectomy, partial    . Parathyroidectomy    . Breast surgery      Family History  Problem Relation Age of Onset  . Arthritis Mother     Social History:  reports that she quit smoking about 23 years ago. She does not have any smokeless tobacco history on file. She reports that she does not drink alcohol or use illicit  drugs.  Allergies  Allergen Reactions  . Penicillins Rash    MEDICATIONS:  Prior to Admission:  Prescriptions prior to admission  Medication Sig Dispense Refill  . amLODipine (NORVASC) 5 MG tablet Take 5 mg by mouth daily.      Marland Kitchen aspirin EC 81 MG tablet Take 81 mg by mouth daily.      Marland Kitchen atorvastatin (LIPITOR) 10 MG tablet Take 10 mg by mouth daily.      . Calcium Carbonate (CALTRATE 600 PO) Take 1 tablet by mouth daily.      . Cholecalciferol (VITAMIN D) 2000 UNITS CAPS Take 1 capsule by mouth daily.       . clonazePAM (KLONOPIN) 0.5 MG tablet Take 0.5 mg by mouth 3 (three) times daily as needed for anxiety.      Marland Kitchen levothyroxine (SYNTHROID, LEVOTHROID) 50 MCG tablet Take 50 mcg by mouth daily before breakfast.      . Melatonin 5 MG TABS Take 3 mg by mouth at bedtime.       . Multiple Vitamins-Minerals (PRESERVISION AREDS) CAPS Take 1 capsule by mouth daily.      . QUEtiapine (SEROQUEL) 25 MG tablet Take 50 mg by mouth at bedtime.      . sertraline (ZOLOFT) 50 MG tablet Take 50 mg by mouth daily.      . fish oil-omega-3 fatty acids 1000 MG capsule Take 1 g by mouth daily.        Scheduled: . acyclovir  400 mg Intravenous Q12H  . amLODipine  5 mg Oral Daily  . aspirin EC  81 mg Oral Daily  . atorvastatin  10 mg Oral Daily  . cefTRIAXone (ROCEPHIN)  IV  2 g Intravenous Q12H  . [START ON 03/26/2014] enoxaparin (LOVENOX) injection  30 mg Subcutaneous Q24H  . levothyroxine  50 mcg Oral QAC breakfast  . sodium chloride  3 mL Intravenous Q12H  . vancomycin  1,000 mg Intravenous Q24H     ROS:                                                                                                                                       History obtained from the patient  General ROS: negative for - chills, fatigue, fever, night sweats, weight gain or weight loss Psychological ROS:  negative for - behavioral disorder, hallucinations, memory difficulties, mood swings or suicidal ideation Ophthalmic ROS: negative for - blurry vision, double vision, eye pain or loss of vision ENT ROS: negative for - epistaxis, nasal discharge, oral lesions, sore throat, tinnitus or vertigo Allergy and Immunology ROS: negative for - hives or itchy/watery eyes Hematological and Lymphatic ROS: negative for - bleeding problems, bruising or swollen lymph nodes Endocrine ROS: negative for - galactorrhea, hair pattern changes, polydipsia/polyuria or temperature intolerance Respiratory ROS: negative for - cough, hemoptysis, shortness of breath or wheezing Cardiovascular ROS: negative for - chest pain, dyspnea on exertion, edema or irregular heartbeat Gastrointestinal ROS: negative for - abdominal pain, diarrhea, hematemesis, nausea/vomiting or  stool incontinence Genito-Urinary ROS: negative for - dysuria, hematuria, incontinence or urinary frequency/urgency Musculoskeletal ROS: negative for - joint swelling or muscular weakness Neurological ROS: as noted in HPI Dermatological ROS: negative for rash and skin lesion changes   Blood pressure 114/41, pulse 82, temperature 99.1 F (37.3 C), temperature source Oral, resp. rate 18, height  (1.473 m), weight 60 kg (132 lb 4.4 oz), SpO2 96.00%.   Neurologic Examination:                                                                                                       General: NAD Mental Status: Alert, oriented, able to spell WORLD forward but refusing to try and spell backward, able to name 11 animals in one minute.  Speech fluent without evidence of aphasia.  Able to follow 3 step commands without difficulty. Cranial Nerves: II: Discs flat bilaterally; Visual fields grossly normal, pupils equal, round, reactive to light and accommodation III,IV, VI: ptosis not present, extra-ocular motions intact bilaterally V,VII: smile symmetric, facial light  touch sensation normal bilaterally VIII: hearing normal bilaterally IX,X: gag reflex present XI: bilateral shoulder shrug XII: midline tongue extension without atrophy or fasciculations  Motor: Right : Upper extremity   5/5    Left:     Upper extremity   5/5  Lower extremity   5/5     Lower extremity   5/5 No tremor or abnormal muscle movements noted.  Tone and bulk:normal tone throughout; no atrophy noted Sensory: Pinprick and light touch intact throughout, bilaterally Deep Tendon Reflexes:  Right: Upper Extremity   Left: Upper extremity   biceps (C-5 to C-6) 2/4   biceps (C-5 to C-6) 2/4 tricep (C7) 2/4    triceps (C7) 2/4 Brachioradialis (C6) 2/4  Brachioradialis (C6) 2/4  Lower Extremity Lower Extremity  quadriceps (L-2 to L-4) 1/4   quadriceps (L-2 to L-4) 1/4 Achilles (S1) 0/4   Achilles (S1) 0/4  Plantars: Mute bilaterally Cerebellar: normal finger-to-nose,  normal heel-to-shin test Gait: not tested CV: pulses palpable throughout   Lab Results: Basic Metabolic Panel:  Recent Labs Lab 03/24/14 1650  NA 143  K 3.7  CL 102  CO2 17*  GLUCOSE 129*  BUN 10  CREATININE 0.72  CALCIUM 10.3    Liver Function Tests:  Recent Labs Lab 03/24/14 1650  AST 16  ALT 8  ALKPHOS 82  BILITOT 0.7  PROT 7.6  ALBUMIN 4.2    Recent Labs Lab 03/24/14 1650  LIPASE 51    Recent Labs Lab 03/24/14 2331  AMMONIA 17    CBC:  Recent Labs Lab 03/24/14 1650  WBC 12.8*  NEUTROABS 11.3*  HGB 15.0  HCT 46.3*  MCV 89.0  PLT 290    Cardiac Enzymes: No results found for this basename: CKTOTAL, CKMB, CKMBINDEX, TROPONINI,  in the last 168 hours  Lipid Panel: No results found for this basename: CHOL, TRIG, HDL, CHOLHDL, VLDL, LDLCALC,  in the last 168 hours  CBG: No results found for this basename: GLUCAP,  in the last 168 hours  Microbiology: Results for  orders placed during the hospital encounter of 03/24/14  CULTURE, BLOOD (ROUTINE X 2)     Status: None    Collection Time    03/24/14  5:20 PM      Result Value Ref Range Status   Specimen Description BLOOD LEFT ARM   Final   Special Requests BOTTLES DRAWN AEROBIC AND ANAEROBIC 10CC   Final   Culture  Setup Time     Final   Value: 03/24/2014 22:37     Performed at [facility]   Culture     Final   Value:        BLOOD CULTURE RECEIVED NO GROWTH TO DATE CULTURE WILL BE HELD FOR 5 DAYS BEFORE ISSUING A FINAL NEGATIVE REPORT     Performed at [facility]   Report Status PENDING   Incomplete  CULTURE, BLOOD (ROUTINE X 2)     Status: None   Collection Time    03/24/14  5:35 PM      Result Value Ref Range Status   Specimen Description BLOOD RIGHT HAND   Final   Special Requests BOTTLES DRAWN AEROBIC ONLY 10CC   Final   Culture  Setup Time     Final   Value: 03/24/2014 22:36     Performed at [facility]   Culture     Final   Value:        BLOOD CULTURE RECEIVED NO GROWTH TO DATE CULTURE WILL BE HELD FOR 5 DAYS BEFORE ISSUING A FINAL NEGATIVE REPORT     Performed at [facility]   Report Status PENDING   Incomplete    Coagulation Studies: No results found for this basename: LABPROT, INR,  in the last 72 hours  Imaging: Ct Head Wo Contrast  03/24/2014   CLINICAL DATA:  Altered mental status.  Confused.  EXAM: CT HEAD WITHOUT CONTRAST  TECHNIQUE: Contiguous axial images were obtained from the base of the skull through the vertex without intravenous contrast.  COMPARISON:  03/20/2010.  FINDINGS: Diffusely enlarged ventricles and subarachnoid spaces. Patchy white matter low density in both cerebral hemispheres. No intracranial hemorrhage, mass lesion or CT evidence of acute infarction. Unremarkable bones and paranasal sinuses.  IMPRESSION: No acute abnormality. Mildly progressive atrophy and chronic small vessel white matter ischemic changes.   Electronically Signed   By: Gordan Payment M.D.   On: 03/24/2014 18:30   Dg Chest Port 1 View  03/24/2014   CLINICAL DATA:   80 year old female with altered mental status and confusion. Initial encounter.  EXAM: PORTABLE CHEST - 1 VIEW  COMPARISON:  03/14/2012.  FINDINGS: Portable AP semi upright view at 1652 hrs. Lower lung volumes. Stable cardiac size and mediastinal contours. Visualized tracheal air column is within normal limits. No pneumothorax. Increased pulmonary vascularity but no overt edema. No definite effusion. No consolidation.  IMPRESSION: Low lung volumes, otherwise no acute cardiopulmonary abnormality.   Electronically Signed   By: Augusto Gamble M.D.   On: 03/24/2014 17:03     Felicie Morn PA-C [facility] [phone]  03/25/2014, 12:28 PM   Assessment/Plan: 80 YO female with AMS and tremor in setting of fever.  Symptoms have fully resolved with administration of ABX.  Exam is non focal and shows no meningismus. MRI brain is negative and EEG shows no epileptiform activity. Likely cause of symptoms was febrile illness which can cause tremor and worsening of mental status in patients with dementia.    Recommend: 1) Continue to treat underlying infection  2) No further neurological intervention is indicated.  I personally participated in this patient's evaluation and management, including formulating above clinical impression and management recommendations.  Venetia Maxon M.D. [facility] [phone]

## 2014-03-26 DIAGNOSIS — R509 Fever, unspecified: Secondary | ICD-10-CM

## 2014-03-26 LAB — CULTURE, BLOOD (ROUTINE X 2)

## 2014-03-26 LAB — URINE CULTURE
Colony Count: NO GROWTH
Culture: NO GROWTH

## 2014-03-26 MED ORDER — QUETIAPINE FUMARATE 50 MG PO TABS
50.0000 mg | ORAL_TABLET | Freq: Every day | ORAL | Status: DC
  Administered 2014-03-26: 50 mg via ORAL
  Filled 2014-03-26 (×2): qty 1

## 2014-03-26 MED ORDER — TRAZODONE HCL 50 MG PO TABS
50.0000 mg | ORAL_TABLET | Freq: Every evening | ORAL | Status: DC | PRN
  Filled 2014-03-26: qty 1

## 2014-03-26 MED ORDER — SERTRALINE HCL 50 MG PO TABS
50.0000 mg | ORAL_TABLET | Freq: Every day | ORAL | Status: DC
  Administered 2014-03-26 – 2014-03-27 (×2): 50 mg via ORAL
  Filled 2014-03-26 (×2): qty 1

## 2014-03-26 NOTE — Care Management Note (Addendum)
  Page 1 of 1   03/27/2014     3:59:18 PM CARE MANAGEMENT NOTE 03/27/2014  Patient:  Brianna Reynolds, Brianna Reynolds   Account Number:  [id number]  Date Initiated:  03/26/2014  Documentation initiated by:  GRAVES-BIGELOW,Jasira Robinson  Subjective/Objective Assessment:   Pt admitted for Acute encephalopathy-  Pt initiated on IV vancomycin.     Action/Plan:   CM will continue to monitor for disposition needs.   Anticipated DC Date:  03/28/2014   Anticipated DC Plan:  HOME W HOME HEALTH SERVICES      DC Planning Services  CM consult      [facility] Choice  HOME HEALTH   Choice offered to / List presented to:  C-1 Patient        HH arranged  HH-1 RN  HH-10 DISEASE MANAGEMENT  HH-2 PT      HH agency  [facility]   Status of service:  Completed, signed off Medicare Important Message given?  YES (If response is "NO", the following Medicare IM given date fields will be blank) Date Medicare IM given:  03/26/2014 Medicare IM given by:  GRAVES-BIGELOW,Jamyrah Saur Date Additional Medicare IM given:   Additional Medicare IM given by:    Discharge Disposition:  HOME W HOME HEALTH SERVICES  Per UR Regulation:  Reviewed for med. necessity/level of care/duration of stay  If discussed at Long Length of Stay Meetings, dates discussed:    Comments:   Referral made to [facility] for Services listed.

## 2014-03-26 NOTE — Progress Notes (Signed)
[facility] PROGRESS NOTE  Brianna Reynolds RUE:[id number] DOB: [birth year] DOA: 03/24/2014 PCP: Lucilla Edin, MD  Assessment/Plan: 1. Acute encephalopathy  -The patient is presenting with acute change in mental status that has been progressively worsening over last few days.  -Patient has had episodes like this in the past.  -She is found to have mild leukocytosis and tmax of 101 -family reported UE shaking with repetitive speech -She presented with lactic acid levels of 6.53.   -CSF attempt was made by ER which was not successful due to possible scoliosis.  -Started on empiric vancomycin ceftriaxone and acyclovir.  -lactic acid level much improved overnight -EEG with mild generalized continuous nonspecific slowing of cerebral activity -MRI brain in the morning for further workup.  -CT of the head is negative.  -Could be related to underlying infectious process. 2. Febrile Illness -Unclear source, u/a negative, CXR unremarkable.  -Blood cultures growing coagulase negative staph -Repeat blood cultures obtained on 8/26 -Antibiotic regimen simplified with discontinuation of Rocephin.   3. Hypertension.  -Continue home medication -Controlled 4.Hypothyroidism.  -TSH is normal -follow free T4 -continue Synthroid.  Code Status: Full Family Communication: Pt in room, family at bedside Disposition Plan: Pending  Consultants:  Neurology  Procedures:  Attempted bedside LP, failed in ED  Antibiotics:  Vanc 8/25>>>  Rocephin 8/25>>>  Acyclovir 8/25>>>  HPI/Subjective: Family reports pt seems better today. No complaints.  Objective: Filed Vitals:   03/25/14 2021 03/26/14 0429 03/26/14 0941 03/26/14 1401  BP: 135/58 121/45 119/40 115/42  Pulse: 78 73  76  Temp: 98.2 F (36.8 C) 98.3 F (36.8 C)  98.2 F (36.8 C)  TempSrc: Oral Oral  Oral  Resp: 18 18    Height:      Weight:  59.804 kg (131 lb 13.5 oz)    SpO2: 98% 100%  97%    Intake/Output Summary  (Last 24 hours) at 03/26/14 1651 Last data filed at 03/25/14 1739  Gross per 24 hour  Intake      0 ml  Output    950 ml  Net   -950 ml   Filed Weights   03/25/14 0130 03/25/14 0627 03/26/14 0429  Weight: 60 kg (132 lb 4.4 oz) 60 kg (132 lb 4.4 oz) 59.804 kg (131 lb 13.5 oz)    Exam:   General:  Awake, in nad  Cardiovascular: regular, s1, s2  Respiratory: normal resp effort, no wheezing  Abdomen: soft,nondistended  Musculoskeletal: perfused, no clubbing   Data Reviewed: Basic Metabolic Panel:  Recent Labs Lab 03/24/14 1650  NA 143  K 3.7  CL 102  CO2 17*  GLUCOSE 129*  BUN 10  CREATININE 0.72  CALCIUM 10.3   Liver Function Tests:  Recent Labs Lab 03/24/14 1650  AST 16  ALT 8  ALKPHOS 82  BILITOT 0.7  PROT 7.6  ALBUMIN 4.2    Recent Labs Lab 03/24/14 1650  LIPASE 51    Recent Labs Lab 03/24/14 2331  AMMONIA 17   CBC:  Recent Labs Lab 03/24/14 1650  WBC 12.8*  NEUTROABS 11.3*  HGB 15.0  HCT 46.3*  MCV 89.0  PLT 290   Cardiac Enzymes: No results found for this basename: CKTOTAL, CKMB, CKMBINDEX, TROPONINI,  in the last 168 hours BNP (last 3 results) No results found for this basename: PROBNP,  in the last 8760 hours CBG: No results found for this basename: GLUCAP,  in the last 168 hours  Recent Results (from the past 240 hour(s))  URINE CULTURE     Status: None   Collection Time    03/24/14  4:52 PM      Result Value Ref Range Status   Specimen Description URINE, CATHETERIZED   Final   Special Requests NONE   Final   Culture  Setup Time     Final   Value: 03/24/2014 19:22     Performed at [facility]   Colony Count     Final   Value: NO GROWTH     Performed at [facility]   Culture     Final   Value: NO GROWTH     Performed at [facility]   Report Status 03/26/2014 FINAL   Final  CULTURE, BLOOD (ROUTINE X 2)     Status: None   Collection Time    03/24/14  5:20 PM      Result Value Ref Range  Status   Specimen Description BLOOD LEFT ARM   Final   Special Requests BOTTLES DRAWN AEROBIC AND ANAEROBIC 10CC   Final   Culture  Setup Time     Final   Value: 03/24/2014 22:37     Performed at [facility]   Culture     Final   Value:        BLOOD CULTURE RECEIVED NO GROWTH TO DATE CULTURE WILL BE HELD FOR 5 DAYS BEFORE ISSUING A FINAL NEGATIVE REPORT     Performed at [facility]   Report Status PENDING   Incomplete  CULTURE, BLOOD (ROUTINE X 2)     Status: None   Collection Time    03/24/14  5:35 PM      Result Value Ref Range Status   Specimen Description BLOOD RIGHT HAND   Final   Special Requests BOTTLES DRAWN AEROBIC ONLY 10CC   Final   Culture  Setup Time     Final   Value: 03/24/2014 22:36     Performed at [facility]   Culture     Final   Value: STAPHYLOCOCCUS SPECIES (COAGULASE NEGATIVE)     Note: THE SIGNIFICANCE OF ISOLATING THIS ORGANISM FROM A SINGLE SET OF BLOOD CULTURES WHEN MULTIPLE SETS ARE DRAWN IS UNCERTAIN. PLEASE NOTIFY THE MICROBIOLOGY DEPARTMENT WITHIN ONE WEEK IF SPECIATION AND SENSITIVITIES ARE REQUIRED.     Note: Gram Stain Report Called to,Read Back By and Verified With: Charlyne Mom  ON [id number] BY [facility]     Performed at [facility]   Report Status 03/26/2014 FINAL   Final     Studies: Ct Head Reynolds Contrast  03/24/2014   CLINICAL DATA:  Altered mental status.  Confused.  EXAM: CT HEAD WITHOUT CONTRAST  TECHNIQUE: Contiguous axial images were obtained from the base of the skull through the vertex without intravenous contrast.  COMPARISON:  03/20/2010.  FINDINGS: Diffusely enlarged ventricles and subarachnoid spaces. Patchy white matter low density in both cerebral hemispheres. No intracranial hemorrhage, mass lesion or CT evidence of acute infarction. Unremarkable bones and paranasal sinuses.  IMPRESSION: No acute abnormality. Mildly progressive atrophy and chronic small vessel white matter ischemic changes.    Electronically Signed   By: Gordan Payment M.D.   On: 03/24/2014 18:30   Brianna Reynolds Contrast  03/25/2014   CLINICAL DATA:  80 year old female with altered mental status, confusion, tremor. Initial encounter.  EXAM: MRI HEAD WITHOUT CONTRAST  TECHNIQUE: Multiplanar, multiecho pulse sequences of the brain and surrounding structures were obtained without intravenous contrast.  COMPARISON:  Head CTs without contrast 03/24/2014 and earlier. [place] [facility] brain MRI 12/26/2006.  FINDINGS: Only mild generalized cerebral volume loss since 2008. Major intracranial vascular flow voids are stable. No restricted diffusion to suggest acute infarction. No midline shift, mass effect, evidence of mass lesion, ventriculomegaly, extra-axial collection or acute intracranial hemorrhage. Cervicomedullary junction and pituitary are within normal limits. Negative visualized cervical spine. Normal bone marrow signal.  Chronic patchy cerebral white matter T2 and FLAIR hyperintensity, nonspecific and mostly periatrial. No significant change since 2008. No cortical encephalomalacia. Mild for age T2 heterogeneity in the deep gray matter nuclei. Brainstem and cerebellum are within normal limits for age. Motion artifact degrades T2 * imaging today.  Grossly normal visualized internal auditory structures. Mastoids are clear. Minor paranasal sinus mucosal thickening. Postoperative changes to the globes. Visualized scalp soft tissues are within normal limits. Normal bone marrow signal.  IMPRESSION: No acute intracranial abnormality.  No significant change in the non contrast MRI appearance of the brain since 2008, with mild to moderate nonspecific white matter signal abnormality.   Electronically Signed   By: Augusto Gamble M.D.   On: 03/25/2014 12:27   Dg Chest Port 1 View  03/24/2014   CLINICAL DATA:  80 year old female with altered mental status and confusion. Initial encounter.  EXAM: PORTABLE CHEST - 1 VIEW  COMPARISON:  03/14/2012.   FINDINGS: Portable AP semi upright view at 1652 hrs. Lower lung volumes. Stable cardiac size and mediastinal contours. Visualized tracheal air column is within normal limits. No pneumothorax. Increased pulmonary vascularity but no overt edema. No definite effusion. No consolidation.  IMPRESSION: Low lung volumes, otherwise no acute cardiopulmonary abnormality.   Electronically Signed   By: Augusto Gamble M.D.   On: 03/24/2014 17:03    Scheduled Meds: . amLODipine  5 mg Oral Daily  . aspirin EC  81 mg Oral Daily  . atorvastatin  10 mg Oral Daily  . enoxaparin (LOVENOX) injection  30 mg Subcutaneous Q24H  . levothyroxine  50 mcg Oral QAC breakfast  . sodium chloride  3 mL Intravenous Q12H  . vancomycin  1,000 mg Intravenous Q24H   Continuous Infusions:    Principal Problem:   Acute encephalopathy Active Problems:   HTN (hypertension)   Insomnia   Sepsis   Hypothyroid   Lactic acidosis  Time spent:   Jeralyn Bennett  [facility] Pager [phone]. If 7PM-7AM, please contact night-coverage at [email or web address], password [facility] 03/26/2014, 4:51 PM  LOS: 2 days

## 2014-03-27 DIAGNOSIS — F0391 Unspecified dementia with behavioral disturbance: Secondary | ICD-10-CM

## 2014-03-27 DIAGNOSIS — F03918 Unspecified dementia, unspecified severity, with other behavioral disturbance: Secondary | ICD-10-CM

## 2014-03-27 LAB — BASIC METABOLIC PANEL
Anion gap: 15 (ref 5–15)
BUN: 7 mg/dL (ref 6–23)
CO2: 20 mEq/L (ref 19–32)
Calcium: 8.7 mg/dL (ref 8.4–10.5)
Chloride: 109 mEq/L (ref 96–112)
Creatinine, Ser: 0.63 mg/dL (ref 0.50–1.10)
GFR, EST NON AFRICAN AMERICAN: 78 mL/min — AB (ref >90)
GLUCOSE: 79 mg/dL (ref 70–99)
Potassium: 3.5 mEq/L — ABNORMAL LOW (ref 3.7–5.3)
Sodium: 144 mEq/L (ref 137–147)

## 2014-03-27 MED ORDER — QUETIAPINE FUMARATE 25 MG PO TABS: 25.0000 mg | ORAL_TABLET | Freq: Every day | ORAL | Status: DC

## 2014-03-27 MED ORDER — CEFUROXIME AXETIL 250 MG PO TABS: 250.0000 mg | ORAL_TABLET | Freq: Two times a day (BID) | ORAL | Status: DC

## 2014-03-27 NOTE — Discharge Instructions (Signed)
Bacteremia Bacteremia occurs when bacteria get in your blood. Normal blood does not usually have bacteria. Bacteremia is one way infections can spread from one part of the body to another. CAUSES   Causes may include anything that allows bacteria to get into the body. Examples are:  Catheters.  Intravenous (IV) access tubes.  Cuts or scrapes of the skin.  Temporary bacteremia may occur during dental procedures, while brushing your teeth, or during a bowel movement. This rarely causes any symptoms or medical problems.  Bacteria may also get in the bloodstream as a complication of a bacterial infection elsewhere. This includes infected wounds and bacterial infections of the:  Lungs (pneumonia).  Kidneys (pyelonephritis).  Intestines (enteritis, colitis).  Organs in the abdomen (appendicitis, cholecystitis, diverticulitis). SYMPTOMS  The body is usually able to clear small numbers of bacteria out of the blood quickly. Brief bacteremia usually does not cause problems.   Problems can occur if the bacteria start to grow in number or spread to other parts of the body. If the bacteria start growing, you may develop:  Chills.  Fever.  Nausea.  Vomiting.  Sweating.  Lightheadedness and low blood pressure.  Pain.  If bacteria start to grow in the linings around the brain, it is called meningitis. This can cause severe headaches, many other problems, and even death.  If bacteria start to grow in a joint, it causes arthritis with painful joints. If bacteria start to grow in a bone, it is called osteomyelitis.  Bacteria from the blood can also cause sores (abscesses) in many organs, such as the muscle, liver, spleen, lungs, brain, and kidneys. DIAGNOSIS   This condition is diagnosed by cultures of the blood.  Cultures may also be taken from other parts of the body that are thought to be causing the bacteremia. A small piece of tissue, fluid, or other product of the body is  sampled. The sample is then put on a growth plate to see if any bacteria grows.  Other lab tests may be done and the results may be abnormal. TREATMENT  Treatment requires a stay in the hospital. You will be given antibiotic medicine through an IV access tube. PREVENTION  People with an increased risk of developing bacteremia or complications may be given antibiotics before certain procedures. Examples are:  A person with a heart murmur or artificial heart valve, before having his or her teeth cleaned.  Before having a surgical or other invasive procedure.  Before having a bowel procedure. Document Released: 05/01/2006 Document Revised: 10/10/2011 Document Reviewed: 02/10/2011 [facility] Patient Information 2015 [place], [place]. This information is not intended to replace advice given to you by your health care provider. Make sure you discuss any questions you have with your health care provider.  Sepsis Sepsis is a serious infection of your blood or tissues that affects your whole body. The infection that causes sepsis may be bacterial, viral, fungal, or parasitic. Sepsis may be life threatening. Sepsis can cause your blood pressure to drop. This may result in shock. Shock causes your central nervous system and your organs to stop working correctly.  RISK FACTORS Sepsis can happen in anyone, but it is more likely to happen in people who have weakened immune systems. SIGNS AND SYMPTOMS  Symptoms of sepsis can include:  Fever or low body temperature (hypothermia).  Rapid breathing (hyperventilation).  Chills.  Rapid heartbeat (tachycardia).  Confusion or light-headedness.  Trouble breathing.  Urinating much less than usual.  Cool, clammy skin or red, flushed  skin.  Other problems with the heart, kidneys, or brain. DIAGNOSIS  Your health care provider will likely do tests to look for an infection, to see if the infection has spread to your blood, and to see how serious your  condition is. Tests can include:  Blood tests, including cultures of your blood.  Cultures of other fluids from your body, such as:  Urine.  Pus from wounds.  Mucus coughed up from your lungs.  Urine tests other than cultures.  X-ray exams or other imaging tests. TREATMENT  Treatment will begin with elimination of the source of infection. If your sepsis is likely caused by a bacterial or fungal infection, you will be given antibiotic or antifungal medicines. You may also receive:  Oxygen.  Fluids through an IV tube.  Medicines to increase your blood pressure.  A machine to clean your blood (dialysis) if your kidneys fail.  A machine to help you breathe if your lungs fail. SEEK IMMEDIATE MEDICAL CARE IF: You get an infection or develop any of the signs and symptoms of sepsis after surgery or a hospitalization. Document Released: 04/16/2003 Document Revised: 07/23/2013 Document Reviewed: 03/25/2013 [facility] Patient Information 2015 [place], [place]. This information is not intended to replace advice given to you by your health care provider. Make sure you discuss any questions you have with your health care provider. STROKE/TIA DISCHARGE INSTRUCTIONS SMOKING Cigarette smoking nearly doubles your risk of having a stroke & is the single most alterable risk factor  If you smoke or have smoked in the last 12 months, you are advised to quit smoking for your health.  Most of the excess cardiovascular risk related to smoking disappears within a year of stopping.  Ask you doctor about anti-smoking medications  [place] Quit Line: 1-800-QUIT NOW  Free Smoking Cessation Classes [phone]  CHOLESTEROL Know your levels; limit fat & cholesterol in your diet  Lipid Panel     Component Value Date/Time   CHOL 170 11/12/2013 1047   TRIG 163* 11/12/2013 1047   HDL 61 11/12/2013 1047   CHOLHDL 2.8 11/12/2013 1047   VLDL 33 11/12/2013 1047   LDLCALC 76 11/12/2013 1047      Many patients  benefit from treatment even if their cholesterol is at goal.  Goal: Total Cholesterol (CHOL) less than 160  Goal:  Triglycerides (TRIG) less than 150  Goal:  HDL greater than 40  Goal:  LDL (LDLCALC) less than 100   BLOOD PRESSURE American Stroke Association blood pressure target is less that 120/80 mm/Hg  Your discharge blood pressure is:  BP: 105/42 mmHg  Monitor your blood pressure  Limit your salt and alcohol intake  Many individuals will require more than one medication for high blood pressure  DIABETES (A1c is a blood sugar average for last 3 months) Goal HGBA1c is under 7% (HBGA1c is blood sugar average for last 3 months)  Diabetes: No known diagnosis of diabetes    No results found for this basename: HGBA1C     Your HGBA1c can be lowered with medications, healthy diet, and exercise.  Check your blood sugar as directed by your physician  Call your physician if you experience unexplained or low blood sugars.  PHYSICAL ACTIVITY/REHABILITATION Goal is 30 minutes at least 4 days per week  Activity: Increase activity slowly, Therapies: Physical Therapy: Home Health Return to work:   Activity decreases your risk of heart attack and stroke and makes your heart stronger.  It helps control your weight and blood pressure;  helps you relax and can improve your mood.  Participate in a regular exercise program.  Talk with your doctor about the best form of exercise for you (dancing, walking, swimming, cycling).  DIET/WEIGHT Goal is to maintain a healthy weight  Your discharge diet is: Sodium Restricted  liquids Your height is:  Height:  (147.3 cm) Your current weight is: Weight: 59.8 kg (131 lb 13.4 oz) Your Body Mass Index (BMI) is:  BMI (Calculated): 27.7  Following the type of diet specifically designed for you will help prevent another stroke.  Your goal weight range is:    Your goal Body Mass Index (BMI) is 19-24.  Healthy food habits can help reduce 3 risk  factors for stroke:  High cholesterol, hypertension, and excess weight.  RESOURCES Stroke/Support Group:  Call [phone]   STROKE EDUCATION PROVIDED/REVIEWED AND GIVEN TO PATIENT Stroke warning signs and symptoms How to activate emergency medical system (call 911). Medications prescribed at discharge. Need for follow-up after discharge. Personal risk factors for stroke. Pneumonia vaccine given: No Flu vaccine given: No My questions have been answered, the writing is legible, and I understand these instructions.  I will adhere to these goals & educational materials that have been provided to me after my discharge from the hospital.

## 2014-03-27 NOTE — Discharge Summary (Signed)
Physician Discharge Summary  Brianna Reynolds ZOX:[id number] DOB: 05-15-1924 DOA: 03/24/2014  PCP: Lucilla Edin, MD  Admit date: 03/24/2014 Discharge date: 03/27/2014  Time spent: 35 minutes  Recommendations for Outpatient Follow-up:  1. Patient's Seroquel dose decreased to 25 mg PO q hs given oversedation during this hospitalization. May need to be further titrated   Discharge Diagnoses:  Principal Problem:   Acute encephalopathy Active Problems:   HTN (hypertension)   Insomnia   Sepsis   Hypothyroid   Lactic acidosis   Discharge Condition: Stable/improved  Diet recommendation: Heart Healthy  Filed Weights   03/25/14 0627 03/26/14 0429 03/27/14 0400  Weight: 60 kg (132 lb 4.4 oz) 59.804 kg (131 lb 13.5 oz) 59.8 kg (131 lb 13.4 oz)    History of present illness:  Brianna Reynolds is a 80 y.o. female with Past medical history of dementia, fatigue, hypothyroidism, anxiety, hypertension.  Patient presented with episode of confusion. History was obtained from patient's daughter. Patient has been having progressively worsening confusion since last Friday. Today since her symptoms are worsening she was taken to urgent care where she started having shaking episode as per the daughter with agitation and restlessness. There was no focal deficit but there was an episode of slurred speech. Patient continues to complain of "feeling miserable and please let me die".  At her baseline patient is significantly independent and is able to carry her own independent daily living. There is no chest pain shortness of breath fever chills nausea vomiting diarrhea burning urination or incontinence of bowel or bladder.  No recent fall or trauma or injury reported.  As per the daughter the patient has been having episodes of similar confusion lasting for few days and resolving on its own which has improved with introduction of seroquel. Patient has been on Seroquel and Zoloft since long and no recent  change in her medication.  Hospital Course:  Patient is a pleasant 80 year old female with a past medical history of dementia, hypertension, admitted to the medicine service on 03/25/2014 presenting with acute encephalopathy. This was initially worked up with a CT scan of brain which did not show acute abnormality. Patient's was further worked up with MRI of brain that did not reveal acute intracranial abnormality. She was initially found to be febrile have a temperature 101.5 been started on broad-spectrum IV antibiotic therapy with ceftriaxone and vancomycin. Blood cultures  drawn on 03/24/2014 growing coagulase-negative Staphylococcus species, one out of two, felt to be a contaminant. Repeat blood cultures obtained on 03/26/2014 showing no growth. Patient showing gradual clinical improvement and was discharged in stable condition on 03/27/2014. Source of infection not found. Her urine and chest x-ray were unremarkable.                                                                         1. Acute encephalopathy  -The patient is presenting with acute change in mental status that has been progressively worsening over last few days.  -Patient has had episodes like this in the past.  -She is found to have mild leukocytosis and tmax of 10.5  -family reported UE shaking with repetitive speech  -She presented with lactic acid levels of 6.53.  -CSF attempt was made  by ER which was not successful due to possible scoliosis.  -Started on empiric vancomycin ceftriaxone and acyclovir.  -lactic acid level much improved overnight  -EEG with mild generalized continuous nonspecific slowing of cerebral activity  -MRI brain in the morning for further workup.  -CT of the head is negative.  -Could be related to underlying infectious process, back to baseline by 03/26/2014 2. Febrile Illness  -Unclear source, u/a negative, CXR unremarkable.  -Blood cultures growing coagulase negative staph  -Repeat blood cultures  obtained on 8/26  -Will discharge on Ceftin 250 mg PO BID.  3. Hypertension.  -Continue home medication  -Controlled  4.Hypothyroidism.  -TSH is normal  -continue Synthroid.   Discharge Exam: Filed Vitals:   03/27/14 1125  BP: 121/47  Pulse: 73  Temp:   Resp:     General: Awake, in nad  Cardiovascular: regular, s1, s2  Respiratory: normal resp effort, no wheezing  Abdomen: soft,nondistended  Musculoskeletal: perfused, no clubbing   Discharge Instructions You were cared for by a hospitalist during your hospital stay. If you have any questions about your discharge medications or the care you received while you were in the hospital after you are discharged, you can call the unit and asked to speak with the hospitalist on call if the hospitalist that took care of you is not available. Once you are discharged, your primary care physician will handle any further medical issues. Please note that NO REFILLS for any discharge medications will be authorized once you are discharged, as it is imperative that you return to your primary care physician (or establish a relationship with a primary care physician if you do not have one) for your aftercare needs so that they can reassess your need for medications and monitor your lab values.  Discharge Instructions   Call MD for:  difficulty breathing, headache or visual disturbances    Complete by:  As directed      Call MD for:  extreme fatigue    Complete by:  As directed      Call MD for:  hives    Complete by:  As directed      Call MD for:  persistant dizziness or light-headedness    Complete by:  As directed      Call MD for:  persistant nausea and vomiting    Complete by:  As directed      Call MD for:  redness, tenderness, or signs of infection (pain, swelling, redness, odor or green/yellow discharge around incision site)    Complete by:  As directed      Call MD for:  temperature >100.4    Complete by:  As directed      Diet - low  sodium heart healthy    Complete by:  As directed      Increase activity slowly    Complete by:  As directed             Medication List         amLODipine 5 MG tablet  Commonly known as:  NORVASC  Take 5 mg by mouth daily.     aspirin EC 81 MG tablet  Take 81 mg by mouth daily.     atorvastatin 10 MG tablet  Commonly known as:  LIPITOR  Take 10 mg by mouth daily.     CALTRATE 600 PO  Take 1 tablet by mouth daily.     cefUROXime 250 MG tablet  Commonly known as:  CEFTIN  Take 1 tablet (250 mg total) by mouth 2 (two) times daily with a meal.     clonazePAM 0.5 MG tablet  Commonly known as:  KLONOPIN  Take 0.5 mg by mouth 3 (three) times daily as needed for anxiety.     fish oil-omega-3 fatty acids 1000 MG capsule  Take 1 g by mouth daily.     levothyroxine 50 MCG tablet  Commonly known as:  SYNTHROID, LEVOTHROID  Take 50 mcg by mouth daily before breakfast.     Melatonin 5 MG Tabs  Take 3 mg by mouth at bedtime.     PRESERVISION AREDS Caps  Take 1 capsule by mouth daily.     QUEtiapine 25 MG tablet  Commonly known as:  SEROQUEL  Take 1 tablet (25 mg total) by mouth at bedtime.     sertraline 50 MG tablet  Commonly known as:  ZOLOFT  Take 50 mg by mouth daily.     Vitamin D 2000 UNITS Caps  Take 1 capsule by mouth daily.       Allergies  Allergen Reactions  . Penicillins Rash      The results of significant diagnostics from this hospitalization (including imaging, microbiology, ancillary and laboratory) are listed below for reference.    Significant Diagnostic Studies: Ct Head Wo Contrast  03/24/2014   CLINICAL DATA:  Altered mental status.  Confused.  EXAM: CT HEAD WITHOUT CONTRAST  TECHNIQUE: Contiguous axial images were obtained from the base of the skull through the vertex without intravenous contrast.  COMPARISON:  03/20/2010.  FINDINGS: Diffusely enlarged ventricles and subarachnoid spaces. Patchy white matter low density in both cerebral  hemispheres. No intracranial hemorrhage, mass lesion or CT evidence of acute infarction. Unremarkable bones and paranasal sinuses.  IMPRESSION: No acute abnormality. Mildly progressive atrophy and chronic small vessel white matter ischemic changes.   Electronically Signed   By: Gordan Payment M.D.   On: 03/24/2014 18:30   [name]  03/25/2014   CLINICAL DATA:  80 year old female with altered mental status, confusion, tremor. Initial encounter.  EXAM: MRI HEAD WITHOUT CONTRAST  TECHNIQUE: Multiplanar, multiecho pulse sequences of the brain and surrounding structures were obtained without intravenous contrast.  COMPARISON:  Head CTs without contrast 03/24/2014 and earlier. [place] [facility]ology brain MRI 12/26/2006.  FINDINGS: Only mild generalized cerebral volume loss since 2008. Major intracranial vascular flow voids are stable. No restricted diffusion to suggest acute infarction. No midline shift, mass effect, evidence of mass lesion, ventriculomegaly, extra-axial collection or acute intracranial hemorrhage. Cervicomedullary junction and pituitary are within normal limits. Negative visualized cervical spine. Normal bone marrow signal.  Chronic patchy cerebral white matter T2 and FLAIR hyperintensity, nonspecific and mostly periatrial. No significant change since 2008. No cortical encephalomalacia. Mild for age T2 heterogeneity in the deep gray matter nuclei. Brainstem and cerebellum are within normal limits for age. Motion artifact degrades T2 * imaging today.  Grossly normal visualized internal auditory structures. Mastoids are clear. Minor paranasal sinus mucosal thickening. Postoperative changes to the globes. Visualized scalp soft tissues are within normal limits. Normal bone marrow signal.  IMPRESSION: No acute intracranial abnormality.  No significant change in the non contrast MRI appearance of the brain since 2008, with mild to moderate nonspecific white matter signal abnormality.    Electronically Signed   By: Augusto Gamble M.D.   On: 03/25/2014 12:27   Dg Chest Port 1 View  03/24/2014   CLINICAL DATA:  80 year old female with altered mental status and confusion.  Initial encounter.  EXAM: PORTABLE CHEST - 1 VIEW  COMPARISON:  03/14/2012.  FINDINGS: Portable AP semi upright view at 1652 hrs. Lower lung volumes. Stable cardiac size and mediastinal contours. Visualized tracheal air column is within normal limits. No pneumothorax. Increased pulmonary vascularity but no overt edema. No definite effusion. No consolidation.  IMPRESSION: Low lung volumes, otherwise no acute cardiopulmonary abnormality.   Electronically Signed   By: Augusto Gamble M.D.   On: 03/24/2014 17:03    Microbiology: Recent Results (from the past 240 hour(s))  URINE CULTURE     Status: None   Collection Time    03/24/14  4:52 PM      Result Value Ref Range Status   Specimen Description URINE, CATHETERIZED   Final   Special Requests NONE   Final   Culture  Setup Time     Final   Value: 03/24/2014 19:22     Performed at [facility] Count     Final   Value: NO GROWTH     Performed at [facility]   Culture     Final   Value: NO GROWTH     Performed at [facility]   Report Status 03/26/2014 FINAL   Final  CULTURE, BLOOD (ROUTINE X 2)     Status: None   Collection Time    03/24/14  5:20 PM      Result Value Ref Range Status   Specimen Description BLOOD LEFT ARM   Final   Special Requests BOTTLES DRAWN AEROBIC AND ANAEROBIC 10CC   Final   Culture  Setup Time     Final   Value: 03/24/2014 22:37     Performed at [facility]   Culture     Final   Value:        BLOOD CULTURE RECEIVED NO GROWTH TO DATE CULTURE WILL BE HELD FOR 5 DAYS BEFORE ISSUING A FINAL NEGATIVE REPORT     Performed at [facility]   Report Status PENDING   Incomplete  CULTURE, BLOOD (ROUTINE X 2)     Status: None   Collection Time    03/24/14  5:35 PM      Result Value Ref Range Status    Specimen Description BLOOD RIGHT HAND   Final   Special Requests BOTTLES DRAWN AEROBIC ONLY 10CC   Final   Culture  Setup Time     Final   Value: 03/24/2014 22:36     Performed at [facility]   Culture     Final   Value: STAPHYLOCOCCUS SPECIES (COAGULASE NEGATIVE)     Note: THE SIGNIFICANCE OF ISOLATING THIS ORGANISM FROM A SINGLE SET OF BLOOD CULTURES WHEN MULTIPLE SETS ARE DRAWN IS UNCERTAIN. PLEASE NOTIFY THE MICROBIOLOGY DEPARTMENT WITHIN ONE WEEK IF SPECIATION AND SENSITIVITIES ARE REQUIRED.     Note: Gram Stain Report Called to,Read Back By and Verified With: Charlyne Mom  ON [id number] BY [facility]     Performed at [facility]   Report Status 03/26/2014 FINAL   Final  CULTURE, BLOOD (ROUTINE X 2)     Status: None   Collection Time    03/26/14 10:54 AM      Result Value Ref Range Status   Specimen Description BLOOD RIGHT HAND   Final   Special Requests BOTTLES DRAWN AEROBIC AND ANAEROBIC 5CC   Final   Culture  Setup Time     Final   Value: 03/26/2014 16:28  Performed at [facility]     Final   Value:        BLOOD CULTURE RECEIVED NO GROWTH TO DATE CULTURE WILL BE HELD FOR 5 DAYS BEFORE ISSUING A FINAL NEGATIVE REPORT     Performed at [facility]   Report Status PENDING   Incomplete  CULTURE, BLOOD (ROUTINE X 2)     Status: None   Collection Time    03/26/14 10:59 AM      Result Value Ref Range Status   Specimen Description BLOOD LEFT ARM   Final   Special Requests BOTTLES DRAWN AEROBIC AND ANAEROBIC 5CC   Final   Culture  Setup Time     Final   Value: 03/26/2014 16:28     Performed at [facility]   Culture     Final   Value:        BLOOD CULTURE RECEIVED NO GROWTH TO DATE CULTURE WILL BE HELD FOR 5 DAYS BEFORE ISSUING A FINAL NEGATIVE REPORT     Performed at [facility]   Report Status PENDING   Incomplete     Labs: Basic Metabolic Panel:  Recent Labs Lab 03/24/14 1650 03/27/14 0427  NA 143 144   K 3.7 3.5*  CL 102 109  CO2 17* 20  GLUCOSE 129* 79  BUN 10 7  CREATININE 0.72 0.63  CALCIUM 10.3 8.7   Liver Function Tests:  Recent Labs Lab 03/24/14 1650  AST 16  ALT 8  ALKPHOS 82  BILITOT 0.7  PROT 7.6  ALBUMIN 4.2    Recent Labs Lab 03/24/14 1650  LIPASE 51    Recent Labs Lab 03/24/14 2331  AMMONIA 17   CBC:  Recent Labs Lab 03/24/14 1650  WBC 12.8*  NEUTROABS 11.3*  HGB 15.0  HCT 46.3*  MCV 89.0  PLT 290   Cardiac Enzymes: No results found for this basename: CKTOTAL, CKMB, CKMBINDEX, TROPONINI,  in the last 168 hours BNP: BNP (last 3 results) No results found for this basename: PROBNP,  in the last 8760 hours CBG: No results found for this basename: GLUCAP,  in the last 168 hours     Signed:  Jeralyn Bennett  [facility] 03/27/2014, 12:26 PM

## 2014-03-27 NOTE — Progress Notes (Signed)
Reviewed discharge instructions with patient, husband and daughter and they stated their understanding.  Discharged home with family via wheelchair.     Filed Vitals:   03/27/14 1408  BP: 105/42  Pulse: 73  Temp: 98.6 F (37 C)  Resp: 18    Yajayra Feldt, Neoma Laming

## 2014-03-28 DIAGNOSIS — I10 Essential (primary) hypertension: Secondary | ICD-10-CM | POA: Diagnosis not present

## 2014-03-28 DIAGNOSIS — F411 Generalized anxiety disorder: Secondary | ICD-10-CM | POA: Diagnosis not present

## 2014-03-28 DIAGNOSIS — A419 Sepsis, unspecified organism: Secondary | ICD-10-CM | POA: Diagnosis not present

## 2014-03-28 DIAGNOSIS — F0390 Unspecified dementia without behavioral disturbance: Secondary | ICD-10-CM | POA: Diagnosis not present

## 2014-03-29 DIAGNOSIS — I10 Essential (primary) hypertension: Secondary | ICD-10-CM | POA: Diagnosis not present

## 2014-03-29 DIAGNOSIS — A419 Sepsis, unspecified organism: Secondary | ICD-10-CM | POA: Diagnosis not present

## 2014-03-29 DIAGNOSIS — F0390 Unspecified dementia without behavioral disturbance: Secondary | ICD-10-CM | POA: Diagnosis not present

## 2014-03-29 DIAGNOSIS — F411 Generalized anxiety disorder: Secondary | ICD-10-CM | POA: Diagnosis not present

## 2014-03-30 LAB — CULTURE, BLOOD (ROUTINE X 2): Culture: NO GROWTH

## 2014-04-01 DIAGNOSIS — I10 Essential (primary) hypertension: Secondary | ICD-10-CM | POA: Diagnosis not present

## 2014-04-01 DIAGNOSIS — A419 Sepsis, unspecified organism: Secondary | ICD-10-CM | POA: Diagnosis not present

## 2014-04-01 DIAGNOSIS — F0390 Unspecified dementia without behavioral disturbance: Secondary | ICD-10-CM | POA: Diagnosis not present

## 2014-04-01 DIAGNOSIS — F411 Generalized anxiety disorder: Secondary | ICD-10-CM | POA: Diagnosis not present

## 2014-04-01 LAB — CULTURE, BLOOD (ROUTINE X 2)
CULTURE: NO GROWTH
Culture: NO GROWTH

## 2014-04-02 DIAGNOSIS — A419 Sepsis, unspecified organism: Secondary | ICD-10-CM | POA: Diagnosis not present

## 2014-04-02 DIAGNOSIS — F0390 Unspecified dementia without behavioral disturbance: Secondary | ICD-10-CM | POA: Diagnosis not present

## 2014-04-02 DIAGNOSIS — I10 Essential (primary) hypertension: Secondary | ICD-10-CM | POA: Diagnosis not present

## 2014-04-02 DIAGNOSIS — F411 Generalized anxiety disorder: Secondary | ICD-10-CM | POA: Diagnosis not present

## 2014-04-03 DIAGNOSIS — I10 Essential (primary) hypertension: Secondary | ICD-10-CM | POA: Diagnosis not present

## 2014-04-03 DIAGNOSIS — F411 Generalized anxiety disorder: Secondary | ICD-10-CM | POA: Diagnosis not present

## 2014-04-03 DIAGNOSIS — A419 Sepsis, unspecified organism: Secondary | ICD-10-CM | POA: Diagnosis not present

## 2014-04-03 DIAGNOSIS — F0390 Unspecified dementia without behavioral disturbance: Secondary | ICD-10-CM | POA: Diagnosis not present

## 2014-04-04 ENCOUNTER — Ambulatory Visit (INDEPENDENT_AMBULATORY_CARE_PROVIDER_SITE_OTHER): Payer: Medicare Other | Admitting: Emergency Medicine

## 2014-04-04 ENCOUNTER — Ambulatory Visit (INDEPENDENT_AMBULATORY_CARE_PROVIDER_SITE_OTHER): Payer: Medicare Other

## 2014-04-04 ENCOUNTER — Encounter: Payer: Self-pay | Admitting: Emergency Medicine

## 2014-04-04 VITALS — BP 106/62 | HR 100 | Temp 97.8°F | Resp 18 | Ht 58.5 in | Wt 132.4 lb

## 2014-04-04 DIAGNOSIS — R509 Fever, unspecified: Secondary | ICD-10-CM | POA: Diagnosis not present

## 2014-04-04 LAB — POCT CBC
Granulocyte percent: 76.6 %G (ref 37–80)
HCT, POC: 45.6 % (ref 37.7–47.9)
HEMOGLOBIN: 14.7 g/dL (ref 12.2–16.2)
Lymph, poc: 2.1 (ref 0.6–3.4)
MCH, POC: 29.5 pg (ref 27–31.2)
MCHC: 32.2 g/dL (ref 31.8–35.4)
MCV: 91.7 fL (ref 80–97)
MID (cbc): 1 — AB (ref 0–0.9)
MPV: 7.8 fL (ref 0–99.8)
POC GRANULOCYTE: 10 — AB (ref 2–6.9)
POC LYMPH PERCENT: 15.9 %L (ref 10–50)
POC MID %: 7.5 %M (ref 0–12)
Platelet Count, POC: 305 10*3/uL (ref 142–424)
RBC: 4.97 M/uL (ref 4.04–5.48)
RDW, POC: 15.1 %
WBC: 13.1 10*3/uL — AB (ref 4.6–10.2)

## 2014-04-04 NOTE — Progress Notes (Signed)
   Subjective:    Patient ID: Brianna Reynolds, female    DOB: 1923/08/19, 80 y.o.   MRN: [id number]  HPI patient here to followup from recent hospitalization. She was admitted with suspected sepsis. Her lactic acid was elevated. She was treated with antibiotics and discharged on by mouth Ceftin. Her blood culture grew coag-negative strep chest x-ray without pneumonic infiltrates LP unable to be performed. Urine culture was negative she continues to be anxious since her discharge. She did appear to have some oversedation while in the hospital.   Review of Systems     Objective:   Physical Exam patient repeatedly asked the same question. Her neck is supple. There are fine bibasilar rales noted. Heart regular rate abdomen soft nontender extremities without edema.  [facility] reading (PRIMARY) by  Dr.Yetzali Weld appears to be some streaking in the left lower lobe this may be an area of atelectasis Results for orders placed in visit on 04/04/14  POCT CBC      Result Value Ref Range   WBC 13.1 (*) 4.6 - 10.2 K/uL   Lymph, poc 2.1  0.6 - 3.4   POC LYMPH PERCENT 15.9  10 - 50 %L   MID (cbc) 1.0 (*) 0 - 0.9   POC MID % 7.5  0 - 12 %M   POC Granulocyte 10.0 (*) 2 - 6.9   Granulocyte percent 76.6  37 - 80 %G   RBC 4.97  4.04 - 5.48 M/uL   Hemoglobin 14.7  12.2 - 16.2 g/dL   HCT, POC 41.3  24.4 - 47.9 %   MCV 91.7  80 - 97 fL   MCH, POC 29.5  27 - 31.2 pg   MCHC 32.2  31.8 - 35.4 g/dL   RDW, POC 01.0     Platelet Count, POC 305  142 - 424 K/uL   MPV 7.8  0 - 99.8 fL    Assessment & Plan:  Issues about safety at home were discussed with the family. I asked them to look at long-term care management. She has finished her Ceftin. I advised them to take her temperature daily. Home visits will continue from home health. Recheck 2 week. Is going to monitor her temperature at home. She certainly could have some other condition causing her to have the elevated white count. Her chest x-ray per radiology shows no  acute disease. Going to check her temperature every day and monitor her mental status. I work this weekend so I will be available to see her if there is a change.

## 2014-04-08 ENCOUNTER — Encounter: Payer: Self-pay | Admitting: Emergency Medicine

## 2014-04-08 DIAGNOSIS — F0390 Unspecified dementia without behavioral disturbance: Secondary | ICD-10-CM | POA: Diagnosis not present

## 2014-04-08 DIAGNOSIS — F411 Generalized anxiety disorder: Secondary | ICD-10-CM | POA: Diagnosis not present

## 2014-04-08 DIAGNOSIS — A419 Sepsis, unspecified organism: Secondary | ICD-10-CM | POA: Diagnosis not present

## 2014-04-08 DIAGNOSIS — I10 Essential (primary) hypertension: Secondary | ICD-10-CM | POA: Diagnosis not present

## 2014-04-09 DIAGNOSIS — A419 Sepsis, unspecified organism: Secondary | ICD-10-CM | POA: Diagnosis not present

## 2014-04-09 DIAGNOSIS — I10 Essential (primary) hypertension: Secondary | ICD-10-CM | POA: Diagnosis not present

## 2014-04-09 DIAGNOSIS — F411 Generalized anxiety disorder: Secondary | ICD-10-CM | POA: Diagnosis not present

## 2014-04-09 DIAGNOSIS — F0390 Unspecified dementia without behavioral disturbance: Secondary | ICD-10-CM | POA: Diagnosis not present

## 2014-04-10 ENCOUNTER — Ambulatory Visit (INDEPENDENT_AMBULATORY_CARE_PROVIDER_SITE_OTHER): Payer: Medicare Other | Admitting: Emergency Medicine

## 2014-04-10 ENCOUNTER — Other Ambulatory Visit: Payer: Self-pay | Admitting: Emergency Medicine

## 2014-04-10 ENCOUNTER — Encounter: Payer: Self-pay | Admitting: Emergency Medicine

## 2014-04-10 VITALS — BP 110/62 | HR 105 | Temp 97.2°F | Resp 16 | Ht 58.5 in | Wt 133.0 lb

## 2014-04-10 DIAGNOSIS — F0390 Unspecified dementia without behavioral disturbance: Secondary | ICD-10-CM | POA: Diagnosis not present

## 2014-04-10 DIAGNOSIS — D72829 Elevated white blood cell count, unspecified: Secondary | ICD-10-CM | POA: Diagnosis not present

## 2014-04-10 DIAGNOSIS — A419 Sepsis, unspecified organism: Secondary | ICD-10-CM | POA: Diagnosis not present

## 2014-04-10 DIAGNOSIS — R5383 Other fatigue: Principal | ICD-10-CM

## 2014-04-10 DIAGNOSIS — R5381 Other malaise: Secondary | ICD-10-CM | POA: Diagnosis not present

## 2014-04-10 DIAGNOSIS — IMO0002 Reserved for concepts with insufficient information to code with codable children: Secondary | ICD-10-CM

## 2014-04-10 DIAGNOSIS — F411 Generalized anxiety disorder: Secondary | ICD-10-CM | POA: Diagnosis not present

## 2014-04-10 DIAGNOSIS — I10 Essential (primary) hypertension: Secondary | ICD-10-CM | POA: Diagnosis not present

## 2014-04-10 DIAGNOSIS — R451 Restlessness and agitation: Secondary | ICD-10-CM

## 2014-04-10 LAB — CBC WITH DIFFERENTIAL/PLATELET
BASOS ABS: 0 10*3/uL (ref 0.0–0.1)
Basophils Relative: 0 % (ref 0–1)
Eosinophils Absolute: 0.1 10*3/uL (ref 0.0–0.7)
Eosinophils Relative: 1 % (ref 0–5)
HEMATOCRIT: 44.4 % (ref 36.0–46.0)
HEMOGLOBIN: 15 g/dL (ref 12.0–15.0)
LYMPHS ABS: 1.2 10*3/uL (ref 0.7–4.0)
LYMPHS PCT: 10 % — AB (ref 12–46)
MCH: 30.1 pg (ref 26.0–34.0)
MCHC: 33.8 g/dL (ref 30.0–36.0)
MCV: 89.2 fL (ref 78.0–100.0)
MONO ABS: 1 10*3/uL (ref 0.1–1.0)
Monocytes Relative: 9 % (ref 3–12)
NEUTROS ABS: 9.3 10*3/uL — AB (ref 1.7–7.7)
Neutrophils Relative %: 80 % — ABNORMAL HIGH (ref 43–77)
Platelets: 408 10*3/uL — ABNORMAL HIGH (ref 150–400)
RBC: 4.98 MIL/uL (ref 3.87–5.11)
RDW: 14.3 % (ref 11.5–15.5)
WBC: 11.6 10*3/uL — AB (ref 4.0–10.5)

## 2014-04-10 NOTE — Progress Notes (Signed)
   Subjective:    Patient ID: Brianna Reynolds, female    DOB: [birth year], 80 y.o.   MRN: [id number]  This chart was scribed for Collene Gobble, MD by Tonye Royalty, ED Scribe. This patient was seen in room 22 and the patient's care was started at 12:00 PM.   HPI  HPI Comments: Brianna Reynolds is a 80 y.o. female who presents to the [facility] for follow up on recent hospitalization with sepsis. On her last visit, her white count was still elevated, her white count is still elevated. She states shenow feels fine. She has not had a fever at home, is not agitated during the day, and is able to sleep at night with sleeping pill.  Review of Systems  Constitutional: Negative for fever.  Psychiatric/Behavioral: Negative for sleep disturbance and agitation.       Objective:   Physical Exam  Nursing note and vitals reviewed. Constitutional: She is oriented to person, place, and time. She appears well-developed and well-nourished. No distress.  Smiling, alert cooperative  HENT:  Head: Normocephalic and atraumatic.  Eyes: Conjunctivae and EOM are normal.  Neck: Normal range of motion. Neck supple.  Cardiovascular: Normal rate and normal heart sounds.   No murmur heard. Pulmonary/Chest: Effort normal and breath sounds normal. No respiratory distress. She has no wheezes. She has no rales.  Musculoskeletal: Normal range of motion.  Neurological: She is alert and oriented to person, place, and time.  Skin: Skin is warm and dry.  Psychiatric: She has a normal mood and affect.       Assessment & Plan:  Patient is doing well at home, no further fever, sleeping at night, and anxiety issues are much improved. Will repeat CBC, sed rate, and CRP today.  I personally performed the services described in this documentation, which was scribed in my presence. The recorded information has been reviewed and is accurate.

## 2014-04-11 LAB — SEDIMENTATION RATE: Sed Rate: 15 mm/h (ref 0–22)

## 2014-04-11 LAB — C-REACTIVE PROTEIN: CRP: <0.5 mg/dL

## 2014-04-14 DIAGNOSIS — I10 Essential (primary) hypertension: Secondary | ICD-10-CM | POA: Diagnosis not present

## 2014-04-14 DIAGNOSIS — F411 Generalized anxiety disorder: Secondary | ICD-10-CM | POA: Diagnosis not present

## 2014-04-14 DIAGNOSIS — A419 Sepsis, unspecified organism: Secondary | ICD-10-CM | POA: Diagnosis not present

## 2014-04-14 DIAGNOSIS — F0390 Unspecified dementia without behavioral disturbance: Secondary | ICD-10-CM | POA: Diagnosis not present

## 2014-04-16 DIAGNOSIS — I10 Essential (primary) hypertension: Secondary | ICD-10-CM | POA: Diagnosis not present

## 2014-04-16 DIAGNOSIS — F411 Generalized anxiety disorder: Secondary | ICD-10-CM | POA: Diagnosis not present

## 2014-04-16 DIAGNOSIS — A419 Sepsis, unspecified organism: Secondary | ICD-10-CM | POA: Diagnosis not present

## 2014-04-16 DIAGNOSIS — F0390 Unspecified dementia without behavioral disturbance: Secondary | ICD-10-CM | POA: Diagnosis not present

## 2014-04-16 LAB — PATHOLOGIST SMEAR REVIEW

## 2014-04-17 ENCOUNTER — Telehealth: Payer: Self-pay | Admitting: Radiology

## 2014-04-17 NOTE — Telephone Encounter (Signed)
[facility] called. They were unable to do Path review on pt's bloodwork due to it having to be made within 24 hours. Let us know if you want [place] to call her back in for more labs.

## 2014-05-13 ENCOUNTER — Ambulatory Visit (INDEPENDENT_AMBULATORY_CARE_PROVIDER_SITE_OTHER): Payer: Medicare Other | Admitting: Emergency Medicine

## 2014-05-13 VITALS — BP 115/67 | HR 91 | Temp 98.3°F | Resp 16 | Ht 58.5 in | Wt 134.0 lb

## 2014-05-13 DIAGNOSIS — D72829 Elevated white blood cell count, unspecified: Secondary | ICD-10-CM

## 2014-05-13 DIAGNOSIS — G4700 Insomnia, unspecified: Secondary | ICD-10-CM | POA: Diagnosis not present

## 2014-05-13 DIAGNOSIS — Z23 Encounter for immunization: Secondary | ICD-10-CM | POA: Diagnosis not present

## 2014-05-13 LAB — CBC WITH DIFFERENTIAL/PLATELET
BASOS PCT: 1 % (ref 0–1)
Basophils Absolute: 0.1 10*3/uL (ref 0.0–0.1)
EOS ABS: 0.2 10*3/uL (ref 0.0–0.7)
Eosinophils Relative: 2 % (ref 0–5)
HCT: 41.2 % (ref 36.0–46.0)
Hemoglobin: 14 g/dL (ref 12.0–15.0)
Lymphocytes Relative: 24 % (ref 12–46)
Lymphs Abs: 2 10*3/uL (ref 0.7–4.0)
MCH: 30.1 pg (ref 26.0–34.0)
MCHC: 34 g/dL (ref 30.0–36.0)
MCV: 88.6 fL (ref 78.0–100.0)
MONO ABS: 1 10*3/uL (ref 0.1–1.0)
Monocytes Relative: 12 % (ref 3–12)
NEUTROS ABS: 5 10*3/uL (ref 1.7–7.7)
NEUTROS PCT: 61 % (ref 43–77)
Platelets: 374 10*3/uL (ref 150–400)
RBC: 4.65 MIL/uL (ref 3.87–5.11)
RDW: 13.8 % (ref 11.5–15.5)
WBC: 8.2 10*3/uL (ref 4.0–10.5)

## 2014-05-13 MED ORDER — QUETIAPINE FUMARATE 25 MG PO TABS: ORAL_TABLET | ORAL | Status: DC

## 2014-05-13 NOTE — Progress Notes (Deleted)
   Subjective:    Patient ID: Brianna Reynolds, female    DOB: [birth year], 80 y.o.   MRN: [id number]  HPI    Review of Systems     Objective:   Physical Exam        Assessment & Plan:

## 2014-05-13 NOTE — Progress Notes (Signed)
Subjective:  This chart was scribe for Lesle ChrisSteven Domnic Vantol, MD by Angelene GiovanniEmmanuella Mensah, ED Scribe. The patient was seen in Room 23 and the patient's care was started at 3:04 PM.   Patient ID: Brianna KirksAileen D Reynolds, female    DOB: [birth year], 80 y.o.   MRN: [id number]  HPI HPI Comments: Brianna Reynolds is a 80 y.o. female who presents to the [facility] for dementia and anxiety problems. She states that she is well but her daughter reports that she has not been walking nor exercising. Her daughter also reports that her anxiety is better and she has been sleeping regularly. The patient states that she normally takes one pill Seroquel and after an hour if she is not asleep, she takes another one but she states that she has to frequently take the second pill in order to fall asleep. She had her flu shot today. She was hospitalized in August and diagnosed with Sepsis that has since resolved. She is scheduled for another appointment on November 4th, 2015.    Past Medical History  Diagnosis Date  . Abnormal EKG   . Fatigue   . Hyperlipidemia   . Hypothyroid   . HTN (hypertension)     New  . Cataract   . Anxiety    Past Surgical History  Procedure Laterality Date  . Thyroidectomy, partial    . Parathyroidectomy    . Breast surgery     Family History  Problem Relation Age of Onset  . Arthritis Mother    History   Social History  . Marital Status: Married    Spouse Name: N/A    Number of Children: N/A  . Years of Education: N/A   Occupational History  . Not on file.   Social History Main Topics  . Smoking status: Former Smoker    Quit date: 02/10/1991  . Smokeless tobacco: Not on file  . Alcohol Use: No  . Drug Use: No  . Sexual Activity: Not on file   Other Topics Concern  . Not on file   Social History Narrative  . No narrative on file   Allergies  Allergen Reactions  . Penicillins Rash       Review of Systems  Constitutional: Negative for appetite  change.  Psychiatric/Behavioral: Negative for sleep disturbance. The patient is not nervous/anxious.       Objective:   Physical Exam  Nursing note and vitals reviewed. Constitutional: She is oriented to person, place, and time. She appears well-developed and well-nourished.  HENT:  Head: Normocephalic and atraumatic.  Cardiovascular: Normal rate, regular rhythm and normal heart sounds.   No murmur heard. Pulmonary/Chest: Effort normal. No respiratory distress.  Neurological: She is alert and oriented to person, place, and time.  Skin: Skin is warm and dry.  Psychiatric: She has a normal mood and affect.         BP 115/67  Pulse 91  Temp(Src) 98.3 F (36.8 C)  Resp 16  Ht 4' 10.5" (1.486 m)  Wt 134 lb (60.782 kg)  BMI 27.53 kg/m2  SpO2 92%  Assessment & Plan:  I personally performed the services described in this documentation, which was scribed in my presence. The recorded information has been reviewed and is accurate. CBC and path review ordered today  Review done today. She apparently takes Seroquel 25 mg.  at night but if she wakes up she takes another 25. She did appear to be oversedated in the hospital but once she  is been back home and back to her baseline she does require up to 50 mg at bedtime.

## 2014-05-13 NOTE — Patient Instructions (Signed)
Influenza Vaccine (Flu Vaccine, Inactivated or Recombinant) 2014-2015: What You Need to Know 1. Why get vaccinated? Influenza ("flu") is a contagious disease that spreads around the [place] every winter, usually between October and May. Flu is caused by influenza viruses, and is spread mainly by coughing, sneezing, and close contact. Anyone can get flu, but the risk of getting flu is highest among children. Symptoms come on suddenly and may last several days. They can include:  fever/chills  sore throat  muscle aches  fatigue  cough  headache  runny or stuffy nose Flu can make some people much sicker than others. These people include young children, people 65 and older, pregnant women, and people with certain health conditions-such as heart, lung or kidney disease, nervous system disorders, or a weakened immune system. Flu vaccination is especially important for these people, and anyone in close contact with them. Flu can also lead to pneumonia, and make existing medical conditions worse. It can cause diarrhea and seizures in children. Each year thousands of people in the [place] die from flu, and many more are hospitalized. Flu vaccine is the best protection against flu and its complications. Flu vaccine also helps prevent spreading flu from person to person. 2. Inactivated and recombinant flu vaccines You are getting an injectable flu vaccine, which is either an "inactivated" or "recombinant" vaccine. These vaccines do not contain any live influenza virus. They are given by injection with a needle, and often called the "flu shot."  A different live, attenuated (weakened) influenza vaccine is sprayed into the nostrils. This vaccine is described in a separate Vaccine Information Statement. Flu vaccination is recommended every year. Some children 6 months through 8 years of age might need two doses during one year. Flu viruses are always changing. Each year's flu vaccine is made  to protect against 3 or 4 viruses that are likely to cause disease that year. Flu vaccine cannot prevent all cases of flu, but it is the best defense against the disease.  It takes about 2 weeks for protection to develop after the vaccination, and protection lasts several months to a year. Some illnesses that are not caused by influenza virus are often mistaken for flu. Flu vaccine will not prevent these illnesses. It can only prevent influenza. Some inactivated flu vaccine contains a very small amount of a mercury-based preservative called thimerosal. Studies have shown that thimerosal in vaccines is not harmful, but flu vaccines that do not contain a preservative are available. 3. Some people should not get this vaccine Tell the person who gives you the vaccine:  If you have any severe, life-threatening allergies. If you ever had a life-threatening allergic reaction after a dose of flu vaccine, or have a severe allergy to any part of this vaccine, including (for example) an allergy to gelatin, antibiotics, or eggs, you may be advised not to get vaccinated. Most, but not all, types of flu vaccine contain a small amount of egg protein.  If you ever had Guillain-Barr Syndrome (a severe paralyzing illness, also called GBS). Some people with a history of GBS should not get this vaccine. This should be discussed with your doctor.  If you are not feeling well. It is usually okay to get flu vaccine when you have a mild illness, but you might be advised to wait until you feel better. You should come back when you are better. 4. Risks of a vaccine reaction With a vaccine, like any medicine, there is a chance of side   effects. These are usually mild and go away on their own. Problems that could happen after any vaccine:  Brief fainting spells can happen after any medical procedure, including vaccination. Sitting or lying down for about 15 minutes can help prevent fainting, and injuries caused by a fall. Tell  your doctor if you feel dizzy, or have vision changes or ringing in the ears.  Severe shoulder pain and reduced range of motion in the arm where a shot was given can happen, very rarely, after a vaccination.  Severe allergic reactions from a vaccine are very rare, estimated at less than 1 in a million doses. If one were to occur, it would usually be within a few minutes to a few hours after the vaccination. Mild problems following inactivated flu vaccine:  soreness, redness, or swelling where the shot was given  hoarseness  sore, red or itchy eyes  cough  fever  aches  headache  itching  fatigue If these problems occur, they usually begin soon after the shot and last 1 or 2 days. Moderate problems following inactivated flu vaccine:  Young children who get inactivated flu vaccine and pneumococcal vaccine (PCV13) at the same time may be at increased risk for seizures caused by fever. Ask your doctor for more information. Tell your doctor if a child who is getting flu vaccine has ever had a seizure. Inactivated flu vaccine does not contain live flu virus, so you cannot get the flu from this vaccine. As with any medicine, there is a very remote chance of a vaccine causing a serious injury or death. The safety of vaccines is always being monitored. For more information, visit: www.cdc.gov/vaccinesafety/ 5. What if there is a serious reaction? What should I look for?  Look for anything that concerns you, such as signs of a severe allergic reaction, very high fever, or behavior changes. Signs of a severe allergic reaction can include hives, swelling of the face and throat, difficulty breathing, a fast heartbeat, dizziness, and weakness. These would start a few minutes to a few hours after the vaccination. What should I do?  If you think it is a severe allergic reaction or other emergency that can't wait, call 9-1-1 and get the person to the nearest hospital. Otherwise, call your  doctor.  Afterward, the reaction should be reported to the Vaccine Adverse Event Reporting System (VAERS). Your doctor should file this report, or you can do it yourself through the VAERS web site at www.vaers.hhs.gov, or by calling 1-800-822-7967. VAERS does not give medical advice. 6. The National Vaccine Injury Compensation Program The National Vaccine Injury Compensation Program (VICP) is a federal program that was created to compensate people who may have been injured by certain vaccines. Persons who believe they may have been injured by a vaccine can learn about the program and about filing a claim by calling 1-800-338-2382 or visiting the VICP website at www.hrsa.gov/vaccinecompensation. There is a time limit to file a claim for compensation. 7. How can I learn more?  Ask your health care provider.  Call your local or state health department.  Contact the Centers for Disease Control and Prevention (CDC):  Call 1-800-232-4636 (1-800-CDC-INFO) or  Visit CDC's website at www.cdc.gov/flu CDC Vaccine Information Statement (Interim) Inactivated Influenza Vaccine (03/19/2013) Document Released: 05/12/2006 Document Revised: 12/02/2013 Document Reviewed: 07/05/2013 ExitCare Patient Information 2015 [employer]. This information is not intended to replace advice given to you by your health care provider. Make sure you discuss any questions you have with your health   care provider.  

## 2014-05-14 LAB — PATHOLOGIST SMEAR REVIEW

## 2014-05-18 ENCOUNTER — Telehealth: Payer: Self-pay

## 2014-05-18 ENCOUNTER — Ambulatory Visit (INDEPENDENT_AMBULATORY_CARE_PROVIDER_SITE_OTHER): Payer: Medicare Other

## 2014-05-18 ENCOUNTER — Ambulatory Visit (HOSPITAL_COMMUNITY)
Admission: RE | Admit: 2014-05-18 | Discharge: 2014-05-18 | Disposition: A | Discharge status: Home / Self Care | Payer: Medicare Other | Source: Ambulatory Visit | Attending: Family Medicine | Admitting: Family Medicine

## 2014-05-18 ENCOUNTER — Ambulatory Visit (INDEPENDENT_AMBULATORY_CARE_PROVIDER_SITE_OTHER): Payer: Medicare Other | Admitting: Family Medicine

## 2014-05-18 ENCOUNTER — Encounter (HOSPITAL_COMMUNITY): Payer: Self-pay

## 2014-05-18 VITALS — BP 107/68 | HR 106 | Temp 98.4°F | Resp 16 | Ht 61.5 in | Wt 136.0 lb

## 2014-05-18 DIAGNOSIS — R197 Diarrhea, unspecified: Secondary | ICD-10-CM

## 2014-05-18 DIAGNOSIS — K5289 Other specified noninfective gastroenteritis and colitis: Secondary | ICD-10-CM | POA: Diagnosis not present

## 2014-05-18 DIAGNOSIS — K449 Diaphragmatic hernia without obstruction or gangrene: Secondary | ICD-10-CM | POA: Insufficient documentation

## 2014-05-18 DIAGNOSIS — K5730 Diverticulosis of large intestine without perforation or abscess without bleeding: Secondary | ICD-10-CM | POA: Diagnosis not present

## 2014-05-18 DIAGNOSIS — K529 Noninfective gastroenteritis and colitis, unspecified: Secondary | ICD-10-CM

## 2014-05-18 DIAGNOSIS — K5732 Diverticulitis of large intestine without perforation or abscess without bleeding: Secondary | ICD-10-CM

## 2014-05-18 LAB — POCT CBC
Granulocyte percent: 88.6 %G — AB (ref 37–80)
HCT, POC: 44.6 % (ref 37.7–47.9)
Hemoglobin: 14.4 g/dL (ref 12.2–16.2)
Lymph, poc: 1.5 (ref 0.6–3.4)
MCH, POC: 29.4 pg (ref 27–31.2)
MCHC: 32.2 g/dL (ref 31.8–35.4)
MCV: 91.1 fL (ref 80–97)
MID (cbc): 0.6 (ref 0–0.9)
MPV: 7.7 fL (ref 0–99.8)
PLATELET COUNT, POC: 316 10*3/uL (ref 142–424)
POC Granulocyte: 15.8 — AB (ref 2–6.9)
POC LYMPH PERCENT: 8.3 %L — AB (ref 10–50)
POC MID %: 3.1 % (ref 0–12)
RBC: 4.9 M/uL (ref 4.04–5.48)
RDW, POC: 15 %
WBC: 17.8 10*3/uL — AB (ref 4.6–10.2)

## 2014-05-18 LAB — POCT I-STAT CREATININE: Creatinine, Ser: 1 mg/dL (ref 0.50–1.10)

## 2014-05-18 LAB — COMPREHENSIVE METABOLIC PANEL
ALK PHOS: 80 U/L (ref 39–117)
AST: 13 U/L (ref 0–37)
Albumin: 4.1 g/dL (ref 3.5–5.2)
BILIRUBIN TOTAL: 0.6 mg/dL (ref 0.2–1.2)
BUN: 9 mg/dL (ref 6–23)
CHLORIDE: 104 meq/L (ref 96–112)
CO2: 25 mEq/L (ref 19–32)
CREATININE: 0.95 mg/dL (ref 0.50–1.10)
Calcium: 9.8 mg/dL (ref 8.4–10.5)
Glucose, Bld: 109 mg/dL — ABNORMAL HIGH (ref 70–99)
Potassium: 3.9 mEq/L (ref 3.5–5.3)
Sodium: 140 mEq/L (ref 135–145)
Total Protein: 6.9 g/dL (ref 6.0–8.3)

## 2014-05-18 LAB — SEDIMENTATION RATE: Sed Rate: 36 mm/hr — ABNORMAL HIGH (ref 0–22)

## 2014-05-18 MED ORDER — CIPROFLOXACIN HCL 500 MG PO TABS: 500.0000 mg | ORAL_TABLET | Freq: Two times a day (BID) | ORAL | Status: DC

## 2014-05-18 MED ORDER — METRONIDAZOLE 500 MG PO TABS: 500.0000 mg | ORAL_TABLET | Freq: Three times a day (TID) | ORAL | Status: DC

## 2014-05-18 MED ORDER — IOHEXOL 300 MG/ML  SOLN
50.0000 mL | Freq: Once | INTRAMUSCULAR | Status: AC | PRN
  Administered 2014-05-18: 50 mL via INTRAVENOUS

## 2014-05-18 MED ORDER — IOHEXOL 300 MG/ML  SOLN
100.0000 mL | Freq: Once | INTRAMUSCULAR | Status: AC | PRN
  Administered 2014-05-18: 100 mL via INTRAVENOUS

## 2014-05-18 NOTE — Telephone Encounter (Signed)
Patient came in office today and was seen by Dr. Clelia CroftShaw

## 2014-05-18 NOTE — Patient Instructions (Addendum)
[facility] and register at admitting for Out Patient CT scan.   Stop all symptomatic medications that treat diarrhea - do not use the imodium.  If your symptoms are getting worse at all overnight go to the ER.   Diverticulitis Diverticulitis is inflammation or infection of small pouches in your colon that form when you have a condition called diverticulosis. The pouches in your colon are called diverticula. Your colon, or large intestine, is where water is absorbed and stool is formed. Complications of diverticulitis can include:  Bleeding.  Severe infection.  Severe pain.  Perforation of your colon.  Obstruction of your colon. CAUSES  Diverticulitis is caused by bacteria. Diverticulitis happens when stool becomes trapped in diverticula. This allows bacteria to grow in the diverticula, which can lead to inflammation and infection. RISK FACTORS People with diverticulosis are at risk for diverticulitis. Eating a diet that does not include enough fiber from fruits and vegetables may make diverticulitis more likely to develop. SYMPTOMS  Symptoms of diverticulitis may include:  Abdominal pain and tenderness. The pain is normally located on the left side of the abdomen, but may occur in other areas.  Fever and chills.  Bloating.  Cramping.  Nausea.  Vomiting.  Constipation.  Diarrhea.  Blood in your stool. DIAGNOSIS  Your health care provider will ask you about your medical history and do a physical exam. You may need to have tests done because many medical conditions can cause the same symptoms as diverticulitis. Tests may include:  Blood tests.  Urine tests.  Imaging tests of the abdomen, including X-rays and CT scans. When your condition is under control, your health care provider may recommend that you have a colonoscopy. A colonoscopy can show how severe your diverticula are and whether something else is causing your symptoms. TREATMENT  Most cases  of diverticulitis are mild and can be treated at home. Treatment may include:  Taking over-the-counter pain medicines.  Following a clear liquid diet.  Taking antibiotic medicines by mouth for 7-10 days. More severe cases may be treated at a hospital. Treatment may include:  Not eating or drinking.  Taking prescription pain medicine.  Receiving antibiotic medicines through an IV tube.  Receiving fluids and nutrition through an IV tube.  Surgery. HOME CARE INSTRUCTIONS   Follow your health care provider's instructions carefully.  Follow a full liquid diet or other diet as directed by your health care provider. After your symptoms improve, your health care provider may tell you to change your diet. He or she may recommend you eat a high-fiber diet. Fruits and vegetables are good sources of fiber. Fiber makes it easier to pass stool.  Take fiber supplements or probiotics as directed by your health care provider.  Only take medicines as directed by your health care provider.  Keep all your follow-up appointments. SEEK MEDICAL CARE IF:   Your pain does not improve.  You have a hard time eating food.  Your bowel movements do not return to normal. SEEK IMMEDIATE MEDICAL CARE IF:   Your pain becomes worse.  Your symptoms do not get better.  Your symptoms suddenly get worse.  You have a fever.  You have repeated vomiting.  You have bloody or black, tarry stools. MAKE SURE YOU:   Understand these instructions.  Will watch your condition.  Will get help right away if you are not doing well or get worse. Document Released: 04/27/2005 Document Revised: 07/23/2013 Document Reviewed: 06/12/2013 ExitCare Patient Information 2015  [employer]. This information is not intended to replace advice given to you by your health care provider. Make sure you discuss any questions you have with your health care provider. Clear Liquid Diet A clear liquid diet is a short-term diet  that is prescribed to provide the necessary fluid and basic energy you need when you can have nothing else. The clear liquid diet consists of liquids or solids that will become liquid at room temperature. You should be able to see through the liquid. There are many reasons that you may be restricted to clear liquids, such as:  When you have a sudden-onset (acute) condition that occurs before or after surgery.  To help your body slowly get adjusted to food again after a long period when you were unable to have food.  Replacement of fluids when you have a diarrheal disease.  When you are going to have certain exams, such as a colonoscopy, in which instruments are inserted inside your body to look at parts of your digestive system. WHAT CAN I HAVE? A clear liquid diet does not provide all the nutrients you need. It is important to choose a variety of the following items to get as many nutrients as possible:  Vegetable juices that do not have pulp.  Fruit juices and fruit drinks that do not have pulp.  Coffee (regular or decaffeinated), tea, or soda at the discretion of your health care provider.  Clear bouillon, broth, or strained broth-based soups.  High-protein and flavored gelatins.  Sugar or honey.  Ices or frozen ice pops that do not contain milk. If you are not sure whether you can have certain items, you should ask your health care provider. You may also ask your health care provider if there are any other clear liquid options. Document Released: 07/18/2005 Document Revised: 07/23/2013 Document Reviewed: 06/14/2013 [facility] Patient Information 2015 [place]. This information is not intended to replace advice given to you by your health care provider. Make sure you discuss any questions you have with your health care provider.

## 2014-05-18 NOTE — Progress Notes (Addendum)
Subjective:    Patient ID: Brianna Reynolds, female    DOB: July 09, 1924, 80 y.o.   MRN: [id number] This chart was scribed for Norberto Sorenson, MD by Jolene Provost, Medical Scribe. This patient was seen in Room 11 and the patient's care was started a 11:37 AM.  Chief Complaint  Patient presents with  . Diarrhea    x 3 days   Diarrhea  Pertinent negatives include no abdominal pain, chills, fever or vomiting.   Past Medical History  Diagnosis Date  . Abnormal EKG   . Fatigue   . Hyperlipidemia   . Hypothyroid   . HTN (hypertension)     New  . Cataract   . Anxiety    Allergies  Allergen Reactions  . Penicillins Rash   Current Outpatient Prescriptions on File Prior to Visit  Medication Sig Dispense Refill  . amLODipine (NORVASC) 5 MG tablet Take 5 mg by mouth daily.      Marland Kitchen aspirin EC 81 MG tablet Take 81 mg by mouth daily.      Marland Kitchen atorvastatin (LIPITOR) 10 MG tablet Take 10 mg by mouth daily.      . Calcium Carbonate (CALTRATE 600 PO) Take 1 tablet by mouth daily.      . Cholecalciferol (VITAMIN D) 2000 UNITS CAPS Take 1 capsule by mouth daily.       . clonazePAM (KLONOPIN) 0.5 MG tablet Take 0.5 mg by mouth 3 (three) times daily as needed for anxiety.      . fish oil-omega-3 fatty acids 1000 MG capsule Take 1 g by mouth daily.       Marland Kitchen levothyroxine (SYNTHROID, LEVOTHROID) 50 MCG tablet Take 50 mcg by mouth daily before breakfast.      . Melatonin 5 MG TABS Take 3 mg by mouth at bedtime.       . Multiple Vitamins-Minerals (PRESERVISION AREDS) CAPS Take 1 capsule by mouth daily.      . QUEtiapine (SEROQUEL) 25 MG tablet Take one tablet at bedtime if unable to sleep after one to 2 hours may take a second dose  60 tablet  11  . sertraline (ZOLOFT) 50 MG tablet Take 50 mg by mouth daily.       No current facility-administered medications on file prior to visit.   HPI Comments: Brianna Reynolds is a 80 y.o. female who presents to [facility] complaining of persistent diarrhea that started  four days ago. Pt stated her diarrhea started the day after she visited [facility] and saw Dr Cleta Alberts. Pt stated that she has been experiencing severe fecal urgency. Pt states she is having urgent BM 2-3 times per day. Pt states they are large liquid BMs w/o abnml or strong odor.  Pt states she has been eating and drinking normally. Pt has taken imodium 2-3 times per day without relief.  Pt denies blood in stool, melena, or any abnml color. Pt denies increased gas, bloating, stomach pain, light headedness, sick contacts, fever, chills, nausea, or vomiting.  Pt is not taking abx though 6 wks ago she hospitalized for acute encephalopathy which was attributed to sepsis due to fever though no source of infection was ever isolated.  Pt was on empiric zosyn and vanc during sev day hosp and sent home with bid ceftin x 5d. No antibiotics since.   Pt has no past surgical hx. Pt states she has never had a colonoscopy.  Pt was seen at [facility] five days ago by her PCP Dr. Cleta Alberts. Pt received  a flu shot. Pt was doing well at time. No medication changes were made.   No prior colonoscopy found in chart.   Review of Systems  Constitutional: Negative for fever, chills and appetite change.  Gastrointestinal: Positive for diarrhea and rectal pain. Negative for nausea, vomiting, abdominal pain, constipation and blood in stool.  Neurological: Negative for light-headedness.       Objective:  BP 107/68  Pulse 106  Temp(Src) 98.4 F (36.9 C) (Oral)  Resp 16  Ht 5' 1.5" (1.562 m)  Wt 136 lb (61.689 kg)  BMI 25.28 kg/m2  SpO2 99%  Physical Exam  Nursing note and vitals reviewed. Constitutional: She is oriented to person, place, and time. She appears well-developed and well-nourished.  HENT:  Head: Normocephalic and atraumatic.  Eyes: Pupils are equal, round, and reactive to light.  Neck: No JVD present.  Cardiovascular: Regular rhythm.  Tachycardia present.   Pulmonary/Chest: Effort normal and breath sounds normal. No  respiratory distress.  Bibasilar inspiratory rales.  Abdominal: There is tenderness. There is guarding. There is no rebound and negative Murphy's sign.  Hyperactive bowel sounds. Tympanitic bowel sounds. Diffuse TTP.   Neurological: She is alert and oriented to person, place, and time.  Skin: Skin is warm and dry.  Psychiatric: She has a normal mood and affect. Her behavior is normal.   Results for orders placed in visit on 05/18/14  SEDIMENTATION RATE      Result Value Ref Range   Sed Rate 36 (*) 0 - 22 mm/hr  COMPREHENSIVE METABOLIC PANEL      Result Value Ref Range   Sodium 140  135 - 145 mEq/L   Potassium 3.9  3.5 - 5.3 mEq/L   Chloride 104  96 - 112 mEq/L   CO2 25  19 - 32 mEq/L   Glucose, Bld 109 (*) 70 - 99 mg/dL   BUN 9  6 - 23 mg/dL   Creat 4.090.95  8.110.50 - 9.141.10 mg/dL   Total Bilirubin 0.6  0.2 - 1.2 mg/dL   Alkaline Phosphatase 80  39 - 117 U/L   AST 13  0 - 37 U/L   ALT <8  0 - 35 U/L   Total Protein 6.9  6.0 - 8.3 g/dL   Albumin 4.1  3.5 - 5.2 g/dL   Calcium 9.8  8.4 - 78.210.5 mg/dL  POCT CBC      Result Value Ref Range   WBC 17.8 (*) 4.6 - 10.2 K/uL   Lymph, poc 1.5  0.6 - 3.4   POC LYMPH PERCENT 8.3 (*) 10 - 50 %L   MID (cbc) 0.6  0 - 0.9   POC MID % 3.1  0 - 12 %M   POC Granulocyte 15.8 (*) 2 - 6.9   Granulocyte percent 88.6 (*) 37 - 80 %G   RBC 4.90  4.04 - 5.48 M/uL   Hemoglobin 14.4  12.2 - 16.2 g/dL   HCT, POC 95.644.6  21.337.7 - 47.9 %   MCV 91.1  80 - 97 fL   MCH, POC 29.4  27 - 31.2 pg   MCHC 32.2  31.8 - 35.4 g/dL   RDW, POC 08.615.0     Platelet Count, POC 316  142 - 424 K/uL   MPV 7.7  0 - 99.8 fL     [facility] preliminary x-ray report read by Dr. Clelia CroftShaw, acute abdomen series. No free air under diaphragm. Multiple air fluid levels. With mild distension of intestines. Moderate stool burden. EXAM:  ACUTE ABDOMEN SERIES (ABDOMEN 2 VIEW & CHEST 1 VIEW)  COMPARISON: Chest x-ray 04/04/2014  FINDINGS: Lungs are adequately inflated and otherwise clear.  Cardiomediastinal silhouette is within normal. There is calcified plaque over the aortic arch. Findings suggesting a hiatal hernia without significant change. Remainder of the thorax is unremarkable.  Abdominal images demonstrate urine stool within the colon. There is an air-filled bowel loop over the mid abdomen measuring 8.7 cm likely loop of colon. There are a few nonspecific air-fluid levels over the right lower quadrant. There is no free peritoneal air. There is biphasic curvature of the thoracolumbar spine with degenerative changes throughout the spine. There are degenerative changes of the hips and right SI joint.  IMPRESSION: Nonspecific air-fluid levels of the right lower quadrant, otherwise nonobstructive bowel gas pattern.  No acute cardiopulmonary disease.  Possible hiatal hernia.      Assessment & Plan:   Diarrhea - Plan: POCT CBC, DG Abd Acute W/Chest, Sedimentation rate, Stool culture, Clostridium Difficile by PCR, Comprehensive metabolic panel, Fecal lactoferrin, POC Hemoccult Bld/Stl (3-Cd Home Screen)  Diverticulitis of colon - Plan: CT Abdomen Pelvis W Contrast  Colitis, acute - liquid diet, stop imodium, collect stool sample for studies prior to starting antibiotics tonight. Recheck in OV tomorrow.  Meds ordered this encounter  Medications  . DISCONTD: loperamide (IMODIUM A-D) 2 MG tablet    Sig: Take 2 mg by mouth 4 (four) times daily as needed for diarrhea or loose stools.  . metroNIDAZOLE (FLAGYL) 500 MG tablet    Sig: Take 1 tablet (500 mg total) by mouth 3 (three) times daily.    Dispense:  30 tablet    Refill:  0  . ciprofloxacin (CIPRO) 500 MG tablet    Sig: Take 1 tablet (500 mg total) by mouth 2 (two) times daily.    Dispense:  28 tablet    Refill:  0    I personally performed the services described in this documentation, which was scribed in my presence. The recorded information has been reviewed and considered, and addended by me as needed.   Norberto SorensonEva Makaela Cando, MD MPH   EXAM: CT ABDOMEN AND PELVIS WITH CONTRAST  TECHNIQUE: Multidetector CT imaging of the abdomen and pelvis was performed using the standard protocol following bolus administration of intravenous contrast.  CONTRAST: 50mL OMNIPAQUE IOHEXOL 300 MG/ML SOLN, 100mL OMNIPAQUE IOHEXOL 300 MG/ML SOLN  COMPARISON: Radiographs obtained earlier today.  FINDINGS: Small liver cysts and area of focal fat deposition. Small bilateral renal cysts. Larger upper pole renal cyst on the left measuring 3.9 cm in maximum diameter. Mild diffuse wall thickening involving the colon, most pronounced involving the rectum and sigmoid colon. Multiple colonic diverticula. Small to moderate-sized hiatal hernia with a paraesophageal component.  Unremarkable spleen, pancreas, gallbladder, adrenal glands, urinary bladder, uterus and ovaries. No enlarged lymph nodes. Lumbar and lower thoracic spine degenerative changes. Clear lung bases.  IMPRESSION: 1. Mild diffuse colitis, most pronounced involving the rectum and sigmoid colon. 2. Small to moderate-sized hiatal hernia with a paraesophageal component. 3. Colonic diverticulosis without evidence of diverticulitis.   Electronically Signed By: Gordan PaymentSteve Reid M.D. On: 05/18/2014 17:08

## 2014-05-18 NOTE — Telephone Encounter (Signed)
Pt husband called and stated that he really needs to talk with dr Cleta Albertsdaub that his wife was seen last week and got the flu shot and has had diarrhea and he thought that the anit medications was working but it isnt.  Please call charles at [phone]

## 2014-05-19 ENCOUNTER — Encounter: Payer: Self-pay | Admitting: Emergency Medicine

## 2014-05-19 ENCOUNTER — Ambulatory Visit (INDEPENDENT_AMBULATORY_CARE_PROVIDER_SITE_OTHER): Payer: Medicare Other | Admitting: Emergency Medicine

## 2014-05-19 VITALS — BP 108/62 | HR 94 | Temp 98.4°F | Resp 17 | Ht <= 58 in | Wt 138.0 lb

## 2014-05-19 DIAGNOSIS — R197 Diarrhea, unspecified: Secondary | ICD-10-CM

## 2014-05-19 DIAGNOSIS — I499 Cardiac arrhythmia, unspecified: Secondary | ICD-10-CM

## 2014-05-19 LAB — POCT CBC
Granulocyte percent: 84.3 %G — AB (ref 37–80)
HCT, POC: 37.9 % (ref 37.7–47.9)
Hemoglobin: 12.3 g/dL (ref 12.2–16.2)
LYMPH, POC: 1.5 (ref 0.6–3.4)
MCH: 29.5 pg (ref 27–31.2)
MCHC: 32.5 g/dL (ref 31.8–35.4)
MCV: 90.8 fL (ref 80–97)
MID (cbc): 1 — AB (ref 0–0.9)
MPV: 7.6 fL (ref 0–99.8)
POC GRANULOCYTE: 13.8 — AB (ref 2–6.9)
POC LYMPH PERCENT: 9.4 %L — AB (ref 10–50)
POC MID %: 6.3 %M (ref 0–12)
Platelet Count, POC: 263 10*3/uL (ref 142–424)
RBC: 4.17 M/uL (ref 4.04–5.48)
RDW, POC: 14 %
WBC: 16.4 10*3/uL — AB (ref 4.6–10.2)

## 2014-05-19 LAB — POC HEMOCCULT BLD/STL (HOME/3-CARD/SCREEN)
Card #2 Fecal Occult Blod, POC: NEGATIVE
Card #3 Fecal Occult Blood, POC: NEGATIVE
Fecal Occult Blood, POC: NEGATIVE

## 2014-05-19 NOTE — Progress Notes (Signed)
   Subjective:    Patient ID: Brianna Reynolds, female    DOB: 11/16/1923, 80 y.o.   MRN: [id number]  HPI 80 y.o. Female presents to clinic today for a follow up after visit yesterday with Dr. Clelia CroftShaw. Reports feeling worse today then she has been. . Denies any fever or chills. Reports having had some sensitivity to the touch in her abdomen, but it currently having no problems. Reports having a significant bout of diarrhea yesterday after leaving clinic; denies any today. Currently taking flagile and cipro.    Review of Systems     Objective:   Physical Exam patient is still but not toxic. She is alert cooperative oriented. Her chest clear. Heart with a rapid rate slightly irregular. Abdomen was not distended liver spleen not large there is some mild lower abdominal tenderness without rebound Results for orders placed in visit on 05/19/14  POCT CBC      Result Value Ref Range   WBC 16.4 (*) 4.6 - 10.2 K/uL   Lymph, poc 1.5  0.6 - 3.4   POC LYMPH PERCENT 9.4 (*) 10 - 50 %L   MID (cbc) 1.0 (*) 0 - 0.9   POC MID % 6.3  0 - 12 %M   POC Granulocyte 13.8 (*) 2 - 6.9   Granulocyte percent 84.3 (*) 37 - 80 %G   RBC 4.17  4.04 - 5.48 M/uL   Hemoglobin 12.3  12.2 - 16.2 g/dL   HCT, POC 13.037.9  86.537.7 - 47.9 %   MCV 90.8  80 - 97 fL   MCH, POC 29.5  27 - 31.2 pg   MCHC 32.5  31.8 - 35.4 g/dL   RDW, POC 78.414.0     Platelet Count, POC 263  142 - 424 K/uL   MPV 7.6  0 - 99.8 fL   EKG sinus rhythm possible old anterior infarct otherwise unremarkable.      Assessment & Plan:  Her white blood cell count is about the same. She does not look toxic. Her stools have been collected. We'll continue Flagyl and Cipro pending results on her stool studies. We'll recheck Wednesday morning.

## 2014-05-19 NOTE — Patient Instructions (Signed)

## 2014-05-19 NOTE — Addendum Note (Signed)
Addended byAlden Benjamin: Budd Freiermuth R on: 05/19/2014 10:40 AM   Modules accepted: Orders

## 2014-05-20 ENCOUNTER — Telehealth: Payer: Self-pay | Admitting: *Deleted

## 2014-05-20 ENCOUNTER — Telehealth: Payer: Self-pay | Admitting: Radiology

## 2014-05-20 LAB — FECAL LACTOFERRIN, QUANT: Lactoferrin: POSITIVE

## 2014-05-20 LAB — CLOSTRIDIUM DIFFICILE BY PCR: Toxigenic C. Difficile by PCR: DETECTED — CR

## 2014-05-20 NOTE — Telephone Encounter (Signed)
Called patient to speak to her daughter, how is patient doing? Patient is doing okay today, Brianna EvensJoAnne is trying to push fluids, encouraged her to continue and let us know if any thing is needed.

## 2014-05-20 NOTE — Telephone Encounter (Signed)
Dr. Cleta Albertsaub spoke with pt and husband about results.  Advised pt to stop Cipro and to continue the Flagyl.  Daughter Chyrl CivatteJoann advised as well.

## 2014-05-21 ENCOUNTER — Ambulatory Visit (INDEPENDENT_AMBULATORY_CARE_PROVIDER_SITE_OTHER): Payer: Medicare Other | Admitting: Emergency Medicine

## 2014-05-21 VITALS — BP 112/68 | HR 93 | Temp 97.7°F | Resp 16 | Ht <= 58 in | Wt 139.2 lb

## 2014-05-21 DIAGNOSIS — R197 Diarrhea, unspecified: Secondary | ICD-10-CM | POA: Diagnosis not present

## 2014-05-21 LAB — POCT CBC
Granulocyte percent: 78.4 %G (ref 37–80)
HCT, POC: 40.7 % (ref 37.7–47.9)
Hemoglobin: 13.1 g/dL (ref 12.2–16.2)
LYMPH, POC: 1.8 (ref 0.6–3.4)
MCH, POC: 29.3 pg (ref 27–31.2)
MCHC: 32.1 g/dL (ref 31.8–35.4)
MCV: 91.3 fL (ref 80–97)
MID (CBC): 0.6 (ref 0–0.9)
MPV: 7.2 fL (ref 0–99.8)
PLATELET COUNT, POC: 366 10*3/uL (ref 142–424)
POC Granulocyte: 8.6 — AB (ref 2–6.9)
POC LYMPH PERCENT: 16.1 %L (ref 10–50)
POC MID %: 5.5 % (ref 0–12)
RBC: 4.45 M/uL (ref 4.04–5.48)
RDW, POC: 14.3 %
WBC: 11 10*3/uL — AB (ref 4.6–10.2)

## 2014-05-21 MED ORDER — METRONIDAZOLE 500 MG PO TABS: 500.0000 mg | ORAL_TABLET | Freq: Three times a day (TID) | ORAL | Status: DC

## 2014-05-21 NOTE — Progress Notes (Addendum)
   Subjective:    Patient ID: Brianna Reynolds, female    DOB: Dec 19, 1923, 80 y.o.   MRN: [id number]  This chart was scribed for Lesle ChrisSteven Jendayi Berling, MD by Tonye RoyaltyJoshua Chen, ED Scribe. This patient was seen in room 4 and the patient's care was started at 11:14 AM.   HPI  HPI Comments: Brianna Reynolds is a 80 y.o. female who presents to the [facility] for follow up for Clostridium difficile. Per records, she had hospitalization with antibiotic use 7 weeks ago, and diarrhea began 1 week ago. She states her abdomen feels okay at this time and states she'd like to try eating something. She states her stools are still loose, but improved. Instructed her not to drink any alcohol while on Flagyl.   Review of Systems  Constitutional: Positive for appetite change. Negative for fever.  Gastrointestinal: Positive for diarrhea (loose stools but improved from before). Negative for abdominal pain.        Objective:   Physical Exam CONSTITUTIONAL: Well developed/well nourished HEAD: Normocephalic/atraumatic EYES: EOMI/PERRL ENMT: Mucous membranes moist NECK: supple no meningeal signs SPINE:entire spine nontender CV: S1/S2 noted, no murmurs/rubs/gallops noted LUNGS: Lungs are clear to auscultation bilaterally, no apparent distress ABDOMEN: soft, nontender, no rebound or guarding GU:no cva tenderness NEURO: Pt is awake/alert, moves all extremitiesx4 EXTREMITIES: pulses normal, full ROM SKIN: warm, color normal PSYCH: no abnormalities of mood noted  Results for orders placed in visit on 05/21/14  POCT CBC      Result Value Ref Range   WBC 11.0 (*) 4.6 - 10.2 K/uL   Lymph, poc 1.8  0.6 - 3.4   POC LYMPH PERCENT 16.1  10 - 50 %L   MID (cbc) 0.6  0 - 0.9   POC MID % 5.5  0 - 12 %M   POC Granulocyte 8.6 (*) 2 - 6.9   Granulocyte percent 78.4  37 - 80 %G   RBC 4.45  4.04 - 5.48 M/uL   Hemoglobin 13.1  12.2 - 16.2 g/dL   HCT, POC 29.540.7  62.137.7 - 47.9 %   MCV 91.3  80 - 97 fL   MCH,  POC 29.3  27 - 31.2 pg   MCHC 32.1  31.8 - 35.4 g/dL   RDW, POC 30.814.3     Platelet Count, POC 366  142 - 424 K/uL   MPV 7.2  0 - 99.8 fL       Assessment & Plan:   Patient did well. I will refill the Flagyl.  . She has an appointment in November 3  I personally performed the services described in this documentation, which was scribed in my presence. The recorded information has been reviewed and is accurate.

## 2014-05-21 NOTE — Patient Instructions (Signed)
Clostridium Difficile Infection °Clostridium difficile (C. difficile) is a bacteria found in the intestinal tract or colon. Under certain conditions, it causes diarrhea and sometimes severe disease. The severe form of the disease is known as pseudomembranous colitis (often called C. difficile colitis). This disease can damage the lining of the colon or cause the colon to become enlarged (toxic megacolon). °CAUSES °Your colon normally contains many different bacteria, including C. difficile. The balance of bacteria in your colon can change during illness. This is especially true when you take antibiotic medicine. Taking antibiotics may allow the C. difficile to grow, multiply excessively, and make a toxin that then causes illness. The elderly and people with certain medical conditions have a greater risk of getting C. difficile infections. °SYMPTOMS °· Watery diarrhea. °· Fever. °· Fatigue. °· Loss of appetite. °· Nausea. °· Abdominal swelling, pain, or tenderness. °· Dehydration. °DIAGNOSIS °Your symptoms may make your caregiver suspect a C. difficile infection, especially if you have used antibiotics in the preceding weeks. However, there are only 2 ways to know for certain whether you have a C. difficile infection: °· A lab test that finds the toxin in your stool. °· The specific appearance of an abnormality (pseudomembrane) in your colon. This can only be seen by doing a sigmoidoscopy or colonoscopy. These procedures involve passing an instrument through your rectum to look at the inside of your colon. °Your caregiver will help determine if these tests are necessary. °TREATMENT °· Most people are successfully treated with one of two specific antibiotics, usually given by mouth. Other antibiotics you are receiving are stopped if possible. °· Intravenous (IV) fluids and correction of electrolyte imbalance may be necessary. °· Rarely, surgery may be needed to remove the infected part of the intestines. °· Careful  hand washing by you and your caregivers is important to prevent the spread of infection. In the hospital, your caregivers may also put on gowns and gloves to prevent the spread of the C. difficile bacteria. Your room is also cleaned regularly with a solution containing bleach or a product that is known to kill C. difficile. °HOME CARE INSTRUCTIONS °· Drink enough fluids to keep your urine clear or pale yellow. Avoid milk, caffeine, and alcohol. °· Ask your caregiver for specific rehydration instructions. °· Try eating small, frequent meals rather than large meals. °· Take your antibiotics as directed. Finish them even if you start to feel better. °· Do not use medicines to slow diarrhea. This could delay healing or cause complications. °· Wash your hands thoroughly after using the bathroom and before preparing food. °· Make sure people who live with you wash their hands often, too. °· Carefully disinfect all surfaces with a product that contains chlorine bleach. °SEEK MEDICAL CARE IF: °· Diarrhea persists longer than expected or recurs after completing your course of antibiotic treatment for the C. difficile infection. °· You have trouble staying hydrated. °SEEK IMMEDIATE MEDICAL CARE IF: °· You develop a new fever. °· You have increasing abdominal pain or tenderness. °· There is blood in your stools, or your stools are dark black and tarry. °· You cannot hold down food or liquids. °MAKE SURE YOU: °· Understand these instructions. °· Will watch your condition. °· Will get help right away if you are not doing well or get worse. °Document Released: 04/27/2005 Document Revised: 12/02/2013 Document Reviewed: 12/24/2010 °ExitCare® Patient Information ©2015 [employer]. This information is not intended to replace advice given to you by your health care provider. Make sure you   discuss any questions you have with your health care provider. ° °

## 2014-05-22 LAB — STOOL CULTURE

## 2014-05-23 ENCOUNTER — Other Ambulatory Visit: Payer: Self-pay | Admitting: Emergency Medicine

## 2014-06-03 ENCOUNTER — Encounter: Payer: Self-pay | Admitting: Emergency Medicine

## 2014-06-03 ENCOUNTER — Ambulatory Visit (INDEPENDENT_AMBULATORY_CARE_PROVIDER_SITE_OTHER): Payer: Medicare Other | Admitting: Emergency Medicine

## 2014-06-03 ENCOUNTER — Other Ambulatory Visit: Payer: Self-pay | Admitting: Physician Assistant

## 2014-06-03 VITALS — BP 115/70 | HR 95 | Temp 98.6°F | Resp 16 | Ht 59.25 in | Wt 133.2 lb

## 2014-06-03 DIAGNOSIS — K529 Noninfective gastroenteritis and colitis, unspecified: Secondary | ICD-10-CM

## 2014-06-03 DIAGNOSIS — R451 Restlessness and agitation: Secondary | ICD-10-CM

## 2014-06-03 DIAGNOSIS — A047 Enterocolitis due to Clostridium difficile: Secondary | ICD-10-CM

## 2014-06-03 DIAGNOSIS — A0472 Enterocolitis due to Clostridium difficile, not specified as recurrent: Secondary | ICD-10-CM

## 2014-06-03 NOTE — Progress Notes (Signed)
Subjective:  This chart was scribed for [name], MD by Carl Bestelina Holson, Medical Scribe. This patient was seen in Room 23 and the patient's care was started at 11:27 AM.   Patient ID: Brianna Reynolds, female    DOB: 1923-08-03, 80 y.o.   MRN: [id number]  HPI HPI Comments: Brianna Reynolds is a 80 y.o. female who presents to the [facility] for a 3 month follow-up.  She was hospitalized for suspected sepsis and treated with high-power antibiotics and subsequently tested positive for C-Diff.  She is currently on her second course of Flagyl and is still experiencing diarrhea.  Her daughter states that the patient's condition has been stable.   Patient Active Problem List   Diagnosis Date Noted  . Lactic acidosis 03/25/2014  . Hypothyroid   . Sepsis 03/24/2014  . Acute encephalopathy 03/24/2014  . Insomnia 02/28/2013  . Abnormal serum protein electrophoresis 02/11/2013  . Abnormal EKG   . HTN (hypertension)   . Fatigue    Past Medical History  Diagnosis Date  . Abnormal EKG   . Fatigue   . Hyperlipidemia   . Hypothyroid   . HTN (hypertension)     New  . Cataract   . Anxiety    Past Surgical History  Procedure Laterality Date  . Thyroidectomy, partial    . Parathyroidectomy    . Breast surgery     Allergies  Allergen Reactions  . Penicillins Rash   Prior to Admission medications   Medication Sig Start Date End Date Taking? Authorizing Provider  amLODipine (NORVASC) 5 MG tablet TAKE 1 TABLET ONCE DAILY 05/23/14  Yes Morrell RiddleSarah L Weber, PA-C  aspirin EC 81 MG tablet Take 81 mg by mouth daily.   Yes Historical Provider, MD  atorvastatin (LIPITOR) 10 MG tablet TAKE ONE TABLET AT BEDTIME 05/23/14  Yes Morrell RiddleSarah L Weber, PA-C  Calcium Carbonate (CALTRATE 600 PO) Take 1 tablet by mouth daily.   Yes Historical Provider, MD  Cholecalciferol (VITAMIN D) 2000 UNITS CAPS Take 1 capsule by mouth daily.    Yes Historical Provider, MD  ciprofloxacin (CIPRO) 500 MG  tablet Take 1 tablet (500 mg total) by mouth 2 (two) times daily. 05/18/14  Yes Sherren MochaEva N Shaw, MD  clonazePAM (KLONOPIN) 0.5 MG tablet Take 0.5 mg by mouth 3 (three) times daily as needed for anxiety.   Yes Historical Provider, MD  fish oil-omega-3 fatty acids 1000 MG capsule Take 1 g by mouth daily.    Yes Historical Provider, MD  levothyroxine (SYNTHROID) 50 MCG tablet TAKE 1 TABLET EACH DAY 05/23/14  Yes Morrell RiddleSarah L Weber, PA-C  Melatonin 5 MG TABS Take 3 mg by mouth at bedtime.    Yes Historical Provider, MD  metroNIDAZOLE (FLAGYL) 500 MG tablet Take 1 tablet (500 mg total) by mouth 3 (three) times daily. 05/21/14  Yes Collene GobbleSteven A Dawsen Krieger, MD  Multiple Vitamins-Minerals (PRESERVISION AREDS) CAPS Take 1 capsule by mouth daily.   Yes Historical Provider, MD  QUEtiapine (SEROQUEL) 25 MG tablet Take one tablet at bedtime if unable to sleep after one to 2 hours may take a second dose 05/13/14  Yes Collene GobbleSteven A Nicolena Schurman, MD  sertraline (ZOLOFT) 50 MG tablet Take 50 mg by mouth daily.   Yes Historical Provider, MD   History   Social History  . Marital Status: Married    Spouse Name: N/A    Number of Children: N/A  . Years of Education: N/A   Occupational History  .  Not on file.   Social History Main Topics  . Smoking status: Former Smoker    Quit date: 02/10/1991  . Smokeless tobacco: Not on file  . Alcohol Use: No  . Drug Use: No  . Sexual Activity: Not on file   Other Topics Concern  . Not on file   Social History Narrative     Review of Systems  Gastrointestinal: Positive for diarrhea.    Objective:  Physical Exam  Constitutional: She is oriented to person, place, and time. She appears well-developed and well-nourished.  HENT:  Head: Normocephalic and atraumatic.  Eyes: EOM are normal.  Neck: Normal range of motion.  Cardiovascular: Normal rate, regular rhythm and normal heart sounds.   No murmur heard. Pulmonary/Chest: Effort normal and breath sounds normal. No respiratory distress. She  has no wheezes. She has no rales.  Abdominal: Soft. Bowel sounds are normal. There is no tenderness.  Musculoskeletal: Normal range of motion.  Neurological: She is alert and oriented to person, place, and time.  Skin: Skin is warm and dry.  Psychiatric: She has a normal mood and affect. Her behavior is normal.  Nursing note and vitals reviewed.    BP 115/70 mmHg  Pulse 95  Temp(Src) 98.6 F (37 C) (Oral)  Resp 16  Ht 4' 11.25" (1.505 m)  Wt 133 lb 3.2 oz (60.419 kg)  BMI 26.67 kg/m2  SpO2 96%  Assessment & Plan:  Patient improved from super infection with clostridia difficile.  She has 4 days left on medication.  Hopefully that will be enough to treat her infection.  I personally performed the services described in this documentation, which was scribed in my presence. The recorded information has been reviewed and is accurate.

## 2014-06-04 NOTE — Telephone Encounter (Signed)
Patients spouse Leonette MostCharles called requesting a refill on "Sertraline HCL 50 mg". Per spouse the pharmacy has already contacted us. He stated she is almost out. [facility]. Charles call back number is [phone]

## 2014-06-12 ENCOUNTER — Inpatient Hospital Stay (HOSPITAL_COMMUNITY)
Admission: EM | Admit: 2014-06-12 | Discharge: 2014-06-17 | DRG: 872 | Disposition: A | Discharge status: Home / Self Care | Payer: Medicare Other | Attending: Internal Medicine | Admitting: Internal Medicine

## 2014-06-12 ENCOUNTER — Ambulatory Visit (INDEPENDENT_AMBULATORY_CARE_PROVIDER_SITE_OTHER): Payer: Medicare Other | Admitting: Family Medicine

## 2014-06-12 ENCOUNTER — Encounter (HOSPITAL_COMMUNITY): Payer: Self-pay

## 2014-06-12 ENCOUNTER — Telehealth: Payer: Self-pay

## 2014-06-12 ENCOUNTER — Emergency Department (HOSPITAL_COMMUNITY): Payer: Medicare Other

## 2014-06-12 VITALS — BP 88/62 | HR 118 | Temp 98.8°F | Wt 136.0 lb

## 2014-06-12 DIAGNOSIS — Z87891 Personal history of nicotine dependence: Secondary | ICD-10-CM

## 2014-06-12 DIAGNOSIS — R197 Diarrhea, unspecified: Secondary | ICD-10-CM

## 2014-06-12 DIAGNOSIS — I10 Essential (primary) hypertension: Secondary | ICD-10-CM | POA: Diagnosis present

## 2014-06-12 DIAGNOSIS — F329 Major depressive disorder, single episode, unspecified: Secondary | ICD-10-CM | POA: Diagnosis present

## 2014-06-12 DIAGNOSIS — E860 Dehydration: Secondary | ICD-10-CM | POA: Diagnosis present

## 2014-06-12 DIAGNOSIS — IMO0001 Reserved for inherently not codable concepts without codable children: Secondary | ICD-10-CM | POA: Insufficient documentation

## 2014-06-12 DIAGNOSIS — D72829 Elevated white blood cell count, unspecified: Secondary | ICD-10-CM

## 2014-06-12 DIAGNOSIS — E876 Hypokalemia: Secondary | ICD-10-CM | POA: Diagnosis not present

## 2014-06-12 DIAGNOSIS — R05 Cough: Secondary | ICD-10-CM | POA: Insufficient documentation

## 2014-06-12 DIAGNOSIS — A419 Sepsis, unspecified organism: Principal | ICD-10-CM | POA: Diagnosis present

## 2014-06-12 DIAGNOSIS — Z79899 Other long term (current) drug therapy: Secondary | ICD-10-CM | POA: Diagnosis not present

## 2014-06-12 DIAGNOSIS — Z7982 Long term (current) use of aspirin: Secondary | ICD-10-CM

## 2014-06-12 DIAGNOSIS — K51919 Ulcerative colitis, unspecified with unspecified complications: Secondary | ICD-10-CM | POA: Diagnosis not present

## 2014-06-12 DIAGNOSIS — J9811 Atelectasis: Secondary | ICD-10-CM | POA: Diagnosis not present

## 2014-06-12 DIAGNOSIS — E785 Hyperlipidemia, unspecified: Secondary | ICD-10-CM | POA: Diagnosis present

## 2014-06-12 DIAGNOSIS — A047 Enterocolitis due to Clostridium difficile: Secondary | ICD-10-CM | POA: Diagnosis present

## 2014-06-12 DIAGNOSIS — K2970 Gastritis, unspecified, without bleeding: Secondary | ICD-10-CM | POA: Diagnosis not present

## 2014-06-12 DIAGNOSIS — R059 Cough, unspecified: Secondary | ICD-10-CM | POA: Insufficient documentation

## 2014-06-12 DIAGNOSIS — E890 Postprocedural hypothyroidism: Secondary | ICD-10-CM | POA: Diagnosis present

## 2014-06-12 DIAGNOSIS — A0472 Enterocolitis due to Clostridium difficile, not specified as recurrent: Secondary | ICD-10-CM

## 2014-06-12 DIAGNOSIS — A0471 Enterocolitis due to Clostridium difficile, recurrent: Secondary | ICD-10-CM | POA: Diagnosis present

## 2014-06-12 LAB — POCT CBC
GRANULOCYTE PERCENT: 88.8 % — AB (ref 37–80)
HCT, POC: 44.5 % (ref 37.7–47.9)
Hemoglobin: 14.7 g/dL (ref 12.2–16.2)
Lymph, poc: 1.8 (ref 0.6–3.4)
MCH, POC: 30.1 pg (ref 27–31.2)
MCHC: 33 g/dL (ref 31.8–35.4)
MCV: 91.1 fL (ref 80–97)
MID (cbc): 0.9 (ref 0–0.9)
MPV: 8.1 fL (ref 0–99.8)
POC Granulocyte: 21.5 — AB (ref 2–6.9)
POC LYMPH PERCENT: 7.4 %L — AB (ref 10–50)
POC MID %: 3.8 %M (ref 0–12)
Platelet Count, POC: 204 10*3/uL (ref 142–424)
RBC: 4.88 M/uL (ref 4.04–5.48)
RDW, POC: 15.6 %
WBC: 24.2 10*3/uL — AB (ref 4.6–10.2)

## 2014-06-12 LAB — CBC WITH DIFFERENTIAL/PLATELET
Basophils Absolute: 0 10*3/uL (ref 0.0–0.1)
Basophils Relative: 0 % (ref 0–1)
Eosinophils Absolute: 0 10*3/uL (ref 0.0–0.7)
Eosinophils Relative: 0 % (ref 0–5)
HCT: 39.6 % (ref 36.0–46.0)
HEMOGLOBIN: 13 g/dL (ref 12.0–15.0)
LYMPHS ABS: 0.9 10*3/uL (ref 0.7–4.0)
Lymphocytes Relative: 4 % — ABNORMAL LOW (ref 12–46)
MCH: 29.7 pg (ref 26.0–34.0)
MCHC: 32.8 g/dL (ref 30.0–36.0)
MCV: 90.6 fL (ref 78.0–100.0)
MONOS PCT: 7 % (ref 3–12)
Monocytes Absolute: 1.6 10*3/uL — ABNORMAL HIGH (ref 0.1–1.0)
Neutro Abs: 19.1 10*3/uL — ABNORMAL HIGH (ref 1.7–7.7)
Neutrophils Relative %: 89 % — ABNORMAL HIGH (ref 43–77)
Platelets: 279 10*3/uL (ref 150–400)
RBC: 4.37 MIL/uL (ref 3.87–5.11)
RDW: 13.9 % (ref 11.5–15.5)
WBC: 21.7 10*3/uL — ABNORMAL HIGH (ref 4.0–10.5)

## 2014-06-12 LAB — COMPREHENSIVE METABOLIC PANEL
ALK PHOS: 51 U/L (ref 39–117)
ALT: 6 U/L (ref 0–35)
ANION GAP: 16 — AB (ref 5–15)
AST: 13 U/L (ref 0–37)
Albumin: 3.2 g/dL — ABNORMAL LOW (ref 3.5–5.2)
BILIRUBIN TOTAL: 0.8 mg/dL (ref 0.3–1.2)
BUN: 10 mg/dL (ref 6–23)
CHLORIDE: 109 meq/L (ref 96–112)
CO2: 17 meq/L — AB (ref 19–32)
Calcium: 8.9 mg/dL (ref 8.4–10.5)
Creatinine, Ser: 0.81 mg/dL (ref 0.50–1.10)
GFR, EST AFRICAN AMERICAN: 73 mL/min — AB (ref >90)
GFR, EST NON AFRICAN AMERICAN: 63 mL/min — AB (ref >90)
GLUCOSE: 106 mg/dL — AB (ref 70–99)
POTASSIUM: 4.2 meq/L (ref 3.7–5.3)
Sodium: 142 mEq/L (ref 137–147)
Total Protein: 6.4 g/dL (ref 6.0–8.3)

## 2014-06-12 LAB — I-STAT CG4 LACTIC ACID, ED: Lactic Acid, Venous: 1.98 mmol/L (ref 0.5–2.2)

## 2014-06-12 LAB — MAGNESIUM: Magnesium: 1.7 mg/dL (ref 1.5–2.5)

## 2014-06-12 MED ORDER — VANCOMYCIN 50 MG/ML ORAL SOLUTION
125.0000 mg | Freq: Four times a day (QID) | ORAL | Status: DC
  Administered 2014-06-12: 125 mg via ORAL
  Filled 2014-06-12 (×5): qty 2.5

## 2014-06-12 MED ORDER — RIFAXIMIN 200 MG PO TABS: 400.0000 mg | ORAL_TABLET | Freq: Three times a day (TID) | ORAL | Status: DC

## 2014-06-12 MED ORDER — LORAZEPAM 2 MG/ML IJ SOLN
1.0000 mg | Freq: Once | INTRAMUSCULAR | Status: AC
  Administered 2014-06-12: 1 mg via INTRAVENOUS
  Filled 2014-06-12: qty 1

## 2014-06-12 MED ORDER — SODIUM CHLORIDE 0.9 % IV BOLUS (SEPSIS)
500.0000 mL | Freq: Once | INTRAVENOUS | Status: AC
  Administered 2014-06-12: 500 mL via INTRAVENOUS

## 2014-06-12 NOTE — Telephone Encounter (Signed)
Brianna Reynolds called back - very important to talk with Dr. Cleta Albertsaub regarding his sick wife.   [phone]

## 2014-06-12 NOTE — ED Provider Notes (Signed)
CSN: [id number]     Arrival date & time 06/12/14  2028 History   First MD Initiated Contact with Patient 06/12/14 2134     Chief Complaint  Patient presents with  . Diarrhea     (Consider location/radiation/quality/duration/timing/severity/associated sxs/prior Treatment) HPI   Brianna Reynolds is a 80 y.o. female with PMH of dementia, fatigue, hypothyroidism, anxiety, hypertension accompanied by daughter who supplies most of the history complaining of diarrhea intermittently for several weeks, she was treated for C. Diff with PO Flagyl, finish the course last week and she continues to have explosive diarrhea, patient states that she is shaky, feels terrible. She denies melena, hematochezia, abdominal pain, chest pain, palpitations, shortness of breath. As per daughter she had a fever of 100 this afternoon, had headache and was given acetaminophen afterwards.  Past Medical History  Diagnosis Date  . Abnormal EKG   . Fatigue   . Hyperlipidemia   . Hypothyroid   . HTN (hypertension)     New  . Cataract   . Anxiety    Past Surgical History  Procedure Laterality Date  . Thyroidectomy, partial    . Parathyroidectomy    . Breast surgery     Family History  Problem Relation Age of Onset  . Arthritis Mother    History  Substance Use Topics  . Smoking status: Former Smoker    Quit date: 02/10/1991  . Smokeless tobacco: Not on file  . Alcohol Use: No   OB History    No data available     Review of Systems  10 systems reviewed and found to be negative, except as noted in the HPI.   Allergies  Penicillins  Home Medications   Prior to Admission medications   Medication Sig Start Date End Date Taking? Authorizing Provider  acetaminophen (TYLENOL) 325 MG tablet Take 650 mg by mouth every 6 (six) hours as needed for headache (headache).   Yes Historical Provider, MD  amLODipine (NORVASC) 10 MG tablet Take 10 mg by mouth daily.   Yes Historical Provider, MD  aspirin EC 81  MG tablet Take 81 mg by mouth daily.   Yes Historical Provider, MD  atorvastatin (LIPITOR) 10 MG tablet Take 10 mg by mouth daily.   Yes Historical Provider, MD  Calcium Carbonate (CALTRATE 600 PO) Take 1 tablet by mouth daily.   Yes Historical Provider, MD  Cholecalciferol (VITAMIN D) 2000 UNITS CAPS Take 1 capsule by mouth daily.    Yes Historical Provider, MD  clonazePAM (KLONOPIN) 0.5 MG tablet Take 0.5 mg by mouth 3 (three) times daily as needed for anxiety (anxiety).    Yes Historical Provider, MD  levothyroxine (SYNTHROID, LEVOTHROID) 50 MCG tablet Take 50 mcg by mouth daily before breakfast.   Yes Historical Provider, MD  Melatonin 5 MG TABS Take 5 mg by mouth at bedtime.    Yes Historical Provider, MD  Multiple Vitamins-Minerals (PRESERVISION AREDS) CAPS Take 1 capsule by mouth daily.   Yes Historical Provider, MD  QUEtiapine (SEROQUEL) 25 MG tablet Take one tablet at bedtime if unable to sleep after one to 2 hours may take a second dose 05/13/14  Yes Collene Gobble, MD  sertraline (ZOLOFT) 50 MG tablet Take 50 mg by mouth daily.   Yes Historical Provider, MD   BP 104/41 mmHg  Pulse 82  Temp(Src) 98.4 F (36.9 C) (Oral)  Resp 18  Ht 5' (1.524 m)  Wt 133 lb (60.328 kg)  BMI 25.97 kg/m2  SpO2 93% Physical  Exam  Constitutional: She is oriented to person, place, and time. She appears well-developed and well-nourished. No distress.  HENT:  Head: Normocephalic and atraumatic.  Mouth/Throat: Oropharynx is clear and moist.  Eyes: Conjunctivae and EOM are normal. Pupils are equal, round, and reactive to light.  Neck: Normal range of motion.  Cardiovascular: Normal rate, regular rhythm and intact distal pulses.   Pulmonary/Chest: Effort normal and breath sounds normal. No stridor. No respiratory distress. She has no wheezes. She has no rales. She exhibits no tenderness.  Abdominal: Soft. Bowel sounds are normal. She exhibits no distension and no mass. There is no tenderness. There is no  rebound and no guarding.  Musculoskeletal: Normal range of motion. She exhibits no edema or tenderness.  Neurological: She is alert and oriented to person, place, and time.  Psychiatric: She has a normal mood and affect.  Nursing note and vitals reviewed.   ED Course  Procedures (including critical care time) Labs Review Labs Reviewed  CLOSTRIDIUM DIFFICILE BY PCR - Abnormal; Notable for the following:    C difficile by pcr POSITIVE (*)    All other components within normal limits  CBC WITH DIFFERENTIAL - Abnormal; Notable for the following:    WBC 21.7 (*)    Neutrophils Relative % 89 (*)    Neutro Abs 19.1 (*)    Lymphocytes Relative 4 (*)    Monocytes Absolute 1.6 (*)    All other components within normal limits  COMPREHENSIVE METABOLIC PANEL - Abnormal; Notable for the following:    CO2 17 (*)    Glucose, Bld 106 (*)    Albumin 3.2 (*)    GFR calc non Af Amer 63 (*)    GFR calc Af Amer 73 (*)    Anion gap 16 (*)    All other components within normal limits  BASIC METABOLIC PANEL - Abnormal; Notable for the following:    Potassium 3.3 (*)    Glucose, Bld 103 (*)    GFR calc non Af Amer 74 (*)    GFR calc Af Amer 86 (*)    All other components within normal limits  CBC - Abnormal; Notable for the following:    WBC 17.3 (*)    Hemoglobin 11.9 (*)    All other components within normal limits  POC OCCULT BLOOD, ED - Abnormal; Notable for the following:    Fecal Occult Bld POSITIVE (*)    All other components within normal limits  CULTURE, BLOOD (ROUTINE X 2)  CULTURE, BLOOD (ROUTINE X 2)  URINE CULTURE  TSH  MAGNESIUM  GI PATHOGEN PANEL BY PCR, STOOL  I-STAT CG4 LACTIC ACID, ED    Imaging Review Dg Chest 2 View  06/12/2014   CLINICAL DATA:  Fever, cough, diarrhea.  EXAM: CHEST  2 VIEW  COMPARISON:  05/18/2014.  FINDINGS: Stable borderline enlarged cardiac silhouette. A small to moderate-sized hiatal hernia is again demonstrated. Interval minimal bibasilar  atelectasis with a decreased depth of inspiration. Thoracic spine degenerative changes.  IMPRESSION: 1. Decreased inspiration with minimal bibasilar atelectasis. 2. Stable small to moderate-sized hiatal hernia.   Electronically Signed   By: Gordan PaymentSteve  Reid M.D.   On: 06/12/2014 22:29     EKG Interpretation None      MDM   Final diagnoses:  Cough  C. difficile diarrhea  Dehydration    Filed Vitals:   06/13/14 0130 06/13/14 0143 06/13/14 0515 06/13/14 1414  BP:  98/80 105/44 104/41  Pulse:  91 59 82  Temp:  98.2 F (36.8 C) 99.2 F (37.3 C) 98.4 F (36.9 C)  TempSrc:  Oral Oral Oral  Resp:  18 20 18   Height: 5' (1.524 m)     Weight: 133 lb (60.328 kg) 133 lb (60.328 kg)    SpO2:  99% 92% 93%    Medications  acetaminophen (TYLENOL) tablet 650 mg (not administered)  aspirin EC tablet 81 mg (81 mg Oral Given 06/13/14 1026)  atorvastatin (LIPITOR) tablet 10 mg (not administered)  calcium carbonate (OS-CAL - dosed in mg of elemental calcium) tablet 500 mg of elemental calcium (500 mg of elemental calcium Oral Given 06/13/14 1027)  cholecalciferol (VITAMIN D) tablet 2,000 Units (2,000 Units Oral Given 06/13/14 1026)  clonazePAM (KLONOPIN) tablet 0.5 mg (not administered)  levothyroxine (SYNTHROID, LEVOTHROID) tablet 50 mcg (50 mcg Oral Given 06/13/14 0833)  QUEtiapine (SEROQUEL) tablet 25 mg (25 mg Oral Given 06/13/14 0223)  sertraline (ZOLOFT) tablet 50 mg (50 mg Oral Given 06/13/14 1026)  multivitamin-lutein (OCUVITE-LUTEIN) capsule 1 capsule (1 capsule Oral Given 06/13/14 1026)  heparin injection 5,000 Units (5,000 Units Subcutaneous Given 06/13/14 1336)  sodium chloride 0.9 % injection 3 mL (3 mLs Intravenous Given 06/13/14 1027)  0.9 %  sodium chloride infusion ( Intravenous New Bag/Given 06/13/14 1151)  vancomycin (VANCOCIN) 50 mg/mL oral solution 500 mg (500 mg Oral Given 06/13/14 1251)    And  metroNIDAZOLE (FLAGYL) IVPB 500 mg (500 mg Intravenous Given 06/13/14 1445)   sodium chloride 0.9 % bolus 500 mL (0 mLs Intravenous Stopped 06/13/14 0004)  LORazepam (ATIVAN) injection 1 mg (1 mg Intravenous Given 06/12/14 2257)  potassium chloride SA (K-DUR,KLOR-CON) CR tablet 40 mEq (40 mEq Oral Given 06/13/14 1251)    Brianna Reynolds is a 80 y.o. female presenting with Outpatient treatment failure of C. Difficile diarrhea. Patient with normal vital signs, she doesn't clear clinically dehydrated. Abdominal exam with no tenderness palpation, guarding or rebound. Patient is also anxious, Ativan given.  She has a significant leukocytosis of 21.7, she is guaiac positive.no significant electrolyte abnormality. Patient will be admitted to hospitalist Dr. Julian ReilGardner.  This is a shared visit with the attending physician who personally evaluated the patient and agrees with the care plan.    Wynetta Emeryicole Darin Arndt, PA-C 06/13/14 1502  Rolland PorterMark James, MD 06/21/14 2340

## 2014-06-12 NOTE — ED Notes (Signed)
Bed: RESB Expected date: 06/12/14 Expected time: 8:19 PM Means of arrival: Ambulance Comments: Hypotension/diarrhea

## 2014-06-12 NOTE — Telephone Encounter (Signed)
Patient husband called back to check status on previous messages. I spoke with him to get details. States his wife had a colon infection and was treated with abx. She finished abx Saturday and she has had a few loose stools since then but today she has had very bad episodes of diarrhea. It was so bad caused her to have accident on herself. He doesn't know what he/she needs to do. States Dr. Cleta Albertsaub said to call him and let him know any changes or problems. Please advise

## 2014-06-12 NOTE — Progress Notes (Signed)
[facility] [address], [place] [address] [phone]  Date:  06/12/2014   Name:  Brianna Reynolds   DOB:  1924-03-25   MRN:  [id number]  PCP:  Lucilla EdinAUB, STEVE A, MD    Chief Complaint: Ulcerative Colitis   History of Present Illness:  Brianna Reynolds is a 80 y.o. very pleasant female patient who presents with the following:  Here today with illness- she was here 10 days ago with persistent but improving sx from a c diff collitis. She finsihed up her final flagyl on Sunday- today is Wednesday.  Apparently today she got sick again with "explosive diarrhea." her daugther went to see her around 5pm- her temp was 100 degrees, and she had a headache. She has had 2 tylenol.  She has seemed to feel bad, weak, lightheaded.    BP Readings from Last 3 Encounters:  06/03/14 115/70  05/21/14 112/68  05/19/14 108/62     Patient Active Problem List   Diagnosis Date Noted  . Lactic acidosis 03/25/2014  . Hypothyroid   . Sepsis 03/24/2014  . Acute encephalopathy 03/24/2014  . Insomnia 02/28/2013  . Abnormal serum protein electrophoresis 02/11/2013  . Abnormal EKG   . HTN (hypertension)   . Fatigue     Past Medical History  Diagnosis Date  . Abnormal EKG   . Fatigue   . Hyperlipidemia   . Hypothyroid   . HTN (hypertension)     New  . Cataract   . Anxiety     Past Surgical History  Procedure Laterality Date  . Thyroidectomy, partial    . Parathyroidectomy    . Breast surgery      History  Substance Use Topics  . Smoking status: Former Smoker    Quit date: 02/10/1991  . Smokeless tobacco: Not on file  . Alcohol Use: No    Family History  Problem Relation Age of Onset  . Arthritis Mother     Allergies  Allergen Reactions  . Penicillins Rash    Medication list has been reviewed and updated.  Current Outpatient Prescriptions on File Prior to Visit  Medication Sig Dispense Refill  . amLODipine (NORVASC) 5 MG tablet TAKE 1 TABLET  ONCE DAILY 30 tablet 0  . aspirin EC 81 MG tablet Take 81 mg by mouth daily.    Marland Kitchen. atorvastatin (LIPITOR) 10 MG tablet TAKE ONE TABLET AT BEDTIME 30 tablet 0  . Calcium Carbonate (CALTRATE 600 PO) Take 1 tablet by mouth daily.    . Cholecalciferol (VITAMIN D) 2000 UNITS CAPS Take 1 capsule by mouth daily.     . clonazePAM (KLONOPIN) 0.5 MG tablet Take 0.5 mg by mouth 3 (three) times daily as needed for anxiety.    . fish oil-omega-3 fatty acids 1000 MG capsule Take 1 g by mouth daily.     Marland Kitchen. levothyroxine (SYNTHROID) 50 MCG tablet TAKE 1 TABLET EACH DAY 30 tablet 0  . Melatonin 5 MG TABS Take 3 mg by mouth at bedtime.     . Multiple Vitamins-Minerals (PRESERVISION AREDS) CAPS Take 1 capsule by mouth daily.    . QUEtiapine (SEROQUEL) 25 MG tablet Take one tablet at bedtime if unable to sleep after one to 2 hours may take a second dose 60 tablet 11  . sertraline (ZOLOFT) 50 MG tablet TAKE 1 TABLET ONCE DAILY. 30 tablet 4  . ciprofloxacin (CIPRO) 500 MG tablet Take 1 tablet (500 mg total) by mouth 2 (two) times daily.  28 tablet 0  . metroNIDAZOLE (FLAGYL) 500 MG tablet Take 1 tablet (500 mg total) by mouth 3 (three) times daily. 30 tablet 0   No current facility-administered medications on file prior to visit.    Review of Systems:  As per HPI- otherwise negative.   Physical Examination: There were no vitals filed for this visit. There were no vitals filed for this visit. There is no weight on file to calculate BMI. Ideal Body Weight:    GEN: WDWN, NAD, Non-toxic, A & O x 3, frail, repeats "I feel awful, please let me die" HEENT: Atraumatic, Normocephalic. Neck supple. No masses, No LAD. Ears and Nose: No external deformity. CV: RRR, No M/G/R. No JVD. No thrill. No extra heart sounds. PULM: CTA B, no wheezes, crackles, rhonchi. No retractions. No resp. distress. No accessory muscle use. ABD: S, ND.  Difficulty to tell if tenderness- abd exam triggers urge for diarrhea EXTR: No  c/c/e NEURO Normal gait for age PSYCH: Normally interactive.   Results for orders placed or performed in visit on 06/12/14  POCT CBC  Result Value Ref Range   WBC 24.2 (A) 4.6 - 10.2 K/uL   Lymph, poc 1.8 0.6 - 3.4   POC LYMPH PERCENT 7.4 (A) 10 - 50 %L   MID (cbc) 0.9 0 - 0.9   POC MID % 3.8 0 - 12 %M   POC Granulocyte 21.5 (A) 2 - 6.9   Granulocyte percent 88.8 (A) 37 - 80 %G   RBC 4.88 4.04 - 5.48 M/uL   Hemoglobin 14.7 12.2 - 16.2 g/dL   HCT, POC 16.144.5 09.637.7 - 47.9 %   MCV 91.1 80 - 97 fL   MCH, POC 30.1 27 - 31.2 pg   MCHC 33.0 31.8 - 35.4 g/dL   RDW, POC 04.515.6 %   Platelet Count, POC 204 142 - 424 K/uL   MPV 8.1 0 - 99.8 fL   Started 20 gauge IV in right AC Assessment and Plan: Ulcerative colitis, unspecified complication - Plan: POCT CBC  Dehydration  Leukocytosis  Diarrhea  Here today with acute recurrence of diarrhea, fever, malaise.  She seems dehydrated and feels terrible.  Will need further tx in the ED and likely admission for hydration and nursing care.    Signed Abbe AmsterdamJessica Kendal Raffo, MD

## 2014-06-12 NOTE — ED Notes (Signed)
Bed: WA17 Expected date:  Expected time:  Means of arrival:  Comments: Hold for Res B 

## 2014-06-12 NOTE — ED Notes (Signed)
Per EMS, pt complains of diarrhea, [facility] and treated with Flagyl, went back today with continued diarrhea and pain

## 2014-06-12 NOTE — Telephone Encounter (Signed)
Dr Cleta Albertsaub,    Patient is very ill per Leonette Mostharles,  He stated you wanted him to call if she became ill.   He will provide you with the details when you call him back.    [phone]

## 2014-06-12 NOTE — ED Provider Notes (Signed)
Patient seen and examined. Complains of feeling shaky. Normally takes Klonopin. Recent diarrhea, failing outpatient treatment. Agree with admission. Benign abdomen. Hemogram is stable. Hyperactive bowel sounds.  Rolland PorterMark Rayhana Slider, MD 06/12/14 2241

## 2014-06-13 DIAGNOSIS — A047 Enterocolitis due to Clostridium difficile: Secondary | ICD-10-CM | POA: Diagnosis not present

## 2014-06-13 DIAGNOSIS — E876 Hypokalemia: Secondary | ICD-10-CM | POA: Diagnosis not present

## 2014-06-13 DIAGNOSIS — A419 Sepsis, unspecified organism: Secondary | ICD-10-CM | POA: Diagnosis present

## 2014-06-13 DIAGNOSIS — Z79899 Other long term (current) drug therapy: Secondary | ICD-10-CM | POA: Diagnosis not present

## 2014-06-13 DIAGNOSIS — E890 Postprocedural hypothyroidism: Secondary | ICD-10-CM | POA: Diagnosis present

## 2014-06-13 DIAGNOSIS — I10 Essential (primary) hypertension: Secondary | ICD-10-CM | POA: Diagnosis present

## 2014-06-13 DIAGNOSIS — Z87891 Personal history of nicotine dependence: Secondary | ICD-10-CM | POA: Diagnosis not present

## 2014-06-13 DIAGNOSIS — E785 Hyperlipidemia, unspecified: Secondary | ICD-10-CM | POA: Diagnosis present

## 2014-06-13 DIAGNOSIS — F329 Major depressive disorder, single episode, unspecified: Secondary | ICD-10-CM | POA: Diagnosis present

## 2014-06-13 DIAGNOSIS — Z7982 Long term (current) use of aspirin: Secondary | ICD-10-CM | POA: Diagnosis not present

## 2014-06-13 DIAGNOSIS — E860 Dehydration: Secondary | ICD-10-CM | POA: Diagnosis not present

## 2014-06-13 DIAGNOSIS — R197 Diarrhea, unspecified: Secondary | ICD-10-CM | POA: Diagnosis not present

## 2014-06-13 DIAGNOSIS — IMO0001 Reserved for inherently not codable concepts without codable children: Secondary | ICD-10-CM | POA: Insufficient documentation

## 2014-06-13 DIAGNOSIS — R05 Cough: Secondary | ICD-10-CM | POA: Diagnosis not present

## 2014-06-13 DIAGNOSIS — D72829 Elevated white blood cell count, unspecified: Secondary | ICD-10-CM | POA: Diagnosis not present

## 2014-06-13 DIAGNOSIS — A0471 Enterocolitis due to Clostridium difficile, recurrent: Secondary | ICD-10-CM | POA: Diagnosis present

## 2014-06-13 DIAGNOSIS — A0472 Enterocolitis due to Clostridium difficile, not specified as recurrent: Secondary | ICD-10-CM | POA: Diagnosis present

## 2014-06-13 LAB — BASIC METABOLIC PANEL
Anion gap: 14 (ref 5–15)
BUN: 10 mg/dL (ref 6–23)
CO2: 19 meq/L (ref 19–32)
CREATININE: 0.73 mg/dL (ref 0.50–1.10)
Calcium: 8.9 mg/dL (ref 8.4–10.5)
Chloride: 108 mEq/L (ref 96–112)
GFR calc Af Amer: 86 mL/min — ABNORMAL LOW (ref >90)
GFR calc non Af Amer: 74 mL/min — ABNORMAL LOW (ref >90)
Glucose, Bld: 103 mg/dL — ABNORMAL HIGH (ref 70–99)
Potassium: 3.3 mEq/L — ABNORMAL LOW (ref 3.7–5.3)
Sodium: 141 mEq/L (ref 137–147)

## 2014-06-13 LAB — CBC
HEMATOCRIT: 37.9 % (ref 36.0–46.0)
HEMOGLOBIN: 11.9 g/dL — AB (ref 12.0–15.0)
MCH: 28.9 pg (ref 26.0–34.0)
MCHC: 31.4 g/dL (ref 30.0–36.0)
MCV: 92 fL (ref 78.0–100.0)
Platelets: 248 10*3/uL (ref 150–400)
RBC: 4.12 MIL/uL (ref 3.87–5.11)
RDW: 14.2 % (ref 11.5–15.5)
WBC: 17.3 10*3/uL — ABNORMAL HIGH (ref 4.0–10.5)

## 2014-06-13 LAB — GI PATHOGEN PANEL BY PCR, STOOL
C difficile toxin A/B: POSITIVE
CRYPTOSPORIDIUM BY PCR: NEGATIVE
Campylobacter by PCR: NEGATIVE
E coli (ETEC) LT/ST: NEGATIVE
E coli (STEC): NEGATIVE
E coli 0157 by PCR: NEGATIVE
G LAMBLIA BY PCR: NEGATIVE
Norovirus GI/GII: NEGATIVE
ROTAVIRUS A BY PCR: NEGATIVE
SHIGELLA BY PCR: NEGATIVE
Salmonella by PCR: NEGATIVE

## 2014-06-13 LAB — TSH: TSH: 1.99 u[IU]/mL (ref 0.350–4.500)

## 2014-06-13 LAB — CLOSTRIDIUM DIFFICILE BY PCR: Toxigenic C. Difficile by PCR: POSITIVE — AB

## 2014-06-13 LAB — POC OCCULT BLOOD, ED: Fecal Occult Bld: POSITIVE — AB

## 2014-06-13 MED ORDER — CLONAZEPAM 0.5 MG PO TABS
0.5000 mg | ORAL_TABLET | Freq: Three times a day (TID) | ORAL | Status: DC | PRN
  Administered 2014-06-13 – 2014-06-16 (×5): 0.5 mg via ORAL
  Filled 2014-06-13 (×5): qty 1

## 2014-06-13 MED ORDER — ATORVASTATIN CALCIUM 10 MG PO TABS
10.0000 mg | ORAL_TABLET | Freq: Every day | ORAL | Status: DC
  Administered 2014-06-13 – 2014-06-17 (×5): 10 mg via ORAL
  Filled 2014-06-13 (×5): qty 1

## 2014-06-13 MED ORDER — CALCIUM CARBONATE 1250 (500 CA) MG PO TABS
1.0000 | ORAL_TABLET | Freq: Every day | ORAL | Status: DC
  Administered 2014-06-13 – 2014-06-17 (×5): 500 mg via ORAL
  Filled 2014-06-13 (×5): qty 1

## 2014-06-13 MED ORDER — ACETAMINOPHEN 325 MG PO TABS: 650.0000 mg | ORAL_TABLET | Freq: Four times a day (QID) | ORAL | Status: DC | PRN

## 2014-06-13 MED ORDER — SODIUM CHLORIDE 0.9 % IV SOLN
INTRAVENOUS | Status: DC
  Administered 2014-06-13 – 2014-06-15 (×4): via INTRAVENOUS

## 2014-06-13 MED ORDER — AMLODIPINE BESYLATE 10 MG PO TABS
10.0000 mg | ORAL_TABLET | Freq: Every day | ORAL | Status: DC
  Filled 2014-06-13: qty 1

## 2014-06-13 MED ORDER — POTASSIUM CHLORIDE CRYS ER 20 MEQ PO TBCR
40.0000 meq | EXTENDED_RELEASE_TABLET | Freq: Once | ORAL | Status: AC
  Administered 2014-06-13: 40 meq via ORAL
  Filled 2014-06-13: qty 2

## 2014-06-13 MED ORDER — VITAMIN D 1000 UNITS PO TABS
2000.0000 [IU] | ORAL_TABLET | Freq: Every day | ORAL | Status: DC
  Administered 2014-06-13 – 2014-06-17 (×5): 2000 [IU] via ORAL
  Filled 2014-06-13 (×5): qty 2

## 2014-06-13 MED ORDER — METRONIDAZOLE IN NACL 5-0.79 MG/ML-% IV SOLN
500.0000 mg | Freq: Three times a day (TID) | INTRAVENOUS | Status: DC
  Administered 2014-06-13 – 2014-06-17 (×13): 500 mg via INTRAVENOUS
  Filled 2014-06-13 (×14): qty 100

## 2014-06-13 MED ORDER — MELATONIN 5 MG PO TABS: 5.0000 mg | ORAL_TABLET | Freq: Every day | ORAL | Status: DC

## 2014-06-13 MED ORDER — LEVOTHYROXINE SODIUM 50 MCG PO TABS
50.0000 ug | ORAL_TABLET | Freq: Every day | ORAL | Status: DC
  Administered 2014-06-13 – 2014-06-17 (×5): 50 ug via ORAL
  Filled 2014-06-13 (×6): qty 1

## 2014-06-13 MED ORDER — QUETIAPINE FUMARATE 25 MG PO TABS
25.0000 mg | ORAL_TABLET | Freq: Every day | ORAL | Status: DC
Start: 2014-06-13 — End: 2014-06-17
  Administered 2014-06-13 – 2014-06-16 (×5): 25 mg via ORAL
  Filled 2014-06-13 (×6): qty 1

## 2014-06-13 MED ORDER — HEPARIN SODIUM (PORCINE) 5000 UNIT/ML IJ SOLN
5000.0000 [IU] | Freq: Three times a day (TID) | INTRAMUSCULAR | Status: DC
  Administered 2014-06-13 – 2014-06-15 (×7): 5000 [IU] via SUBCUTANEOUS
  Filled 2014-06-13 (×11): qty 1

## 2014-06-13 MED ORDER — OCUVITE-LUTEIN PO CAPS
1.0000 | ORAL_CAPSULE | Freq: Every day | ORAL | Status: DC
  Administered 2014-06-13 – 2014-06-14 (×2): 1 via ORAL
  Filled 2014-06-13 (×3): qty 1

## 2014-06-13 MED ORDER — SERTRALINE HCL 50 MG PO TABS
50.0000 mg | ORAL_TABLET | Freq: Every day | ORAL | Status: DC
  Administered 2014-06-13 – 2014-06-17 (×5): 50 mg via ORAL
  Filled 2014-06-13 (×5): qty 1

## 2014-06-13 MED ORDER — ASPIRIN EC 81 MG PO TBEC
81.0000 mg | DELAYED_RELEASE_TABLET | Freq: Every day | ORAL | Status: DC
  Administered 2014-06-13 – 2014-06-17 (×5): 81 mg via ORAL
  Filled 2014-06-13 (×5): qty 1

## 2014-06-13 MED ORDER — VANCOMYCIN 50 MG/ML ORAL SOLUTION
500.0000 mg | Freq: Four times a day (QID) | ORAL | Status: DC
  Administered 2014-06-13 – 2014-06-17 (×18): 500 mg via ORAL
  Filled 2014-06-13 (×21): qty 10

## 2014-06-13 MED ORDER — SODIUM CHLORIDE 0.9 % IJ SOLN
3.0000 mL | Freq: Two times a day (BID) | INTRAMUSCULAR | Status: DC
  Administered 2014-06-13 – 2014-06-16 (×5): 3 mL via INTRAVENOUS

## 2014-06-13 NOTE — Plan of Care (Signed)
Problem: Phase I Progression Outcomes Goal: Hemodynamically stable Outcome: Completed/Met Date Met:  06/13/14

## 2014-06-13 NOTE — Plan of Care (Signed)
Problem: Consults Goal: General Medical Patient Education See Patient Education Module for specific education.  Outcome: Progressing     

## 2014-06-13 NOTE — H&P (Signed)
[facility] History and Physical  Brianna Reynolds ZOX:[id number] DOB: [birth year] DOA: 06/12/2014  Referring physician: EDP PCP: Lucilla EdinAUB, STEVE A, MD   Chief Complaint: Diarrhea   HPI: Brianna Reynolds is a 80 y.o. female who just completed a second course of antibiotics (PO flagyl) to treat C.Diff diarrhea on Sunday.  Since then she had recurrence of "explosive diarrhea" yesterday and her daughter noted temperature of 100 degrees when she went to check on patient.  Daughter [facility], who after noting her severe dehydration and leukocytosis of 24k (had been 11k on last check 10/21) promptly sent patient to ED.  In the ED she was confirmed to have leukocytosis of 21.7k, tachycardic, and hypotensive with SBP initially of 88.  Thankfully her BP promptly responded to IVF and after 1.5L she is already normotensive and up to an SBP of 121.  Review of Systems: Systems reviewed.  As above, otherwise negative  Past Medical History  Diagnosis Date  . Abnormal EKG   . Fatigue   . Hyperlipidemia   . Hypothyroid   . HTN (hypertension)     New  . Cataract   . Anxiety    Past Surgical History  Procedure Laterality Date  . Thyroidectomy, partial    . Parathyroidectomy    . Breast surgery     Social History:  reports that she quit smoking about 23 years ago. She does not have any smokeless tobacco history on file. She reports that she does not drink alcohol or use illicit drugs.  Allergies  Allergen Reactions  . Penicillins Rash    Family History  Problem Relation Age of Onset  . Arthritis Mother      Prior to Admission medications   Medication Sig Start Date End Date Taking? Authorizing Provider  acetaminophen (TYLENOL) 325 MG tablet Take 650 mg by mouth every 6 (six) hours as needed for headache (headache).   Yes Historical Provider, MD  amLODipine (NORVASC) 10 MG tablet Take 10 mg by mouth daily.   Yes Historical Provider, MD  aspirin EC 81 MG tablet  Take 81 mg by mouth daily.   Yes Historical Provider, MD  atorvastatin (LIPITOR) 10 MG tablet Take 10 mg by mouth daily.   Yes Historical Provider, MD  Calcium Carbonate (CALTRATE 600 PO) Take 1 tablet by mouth daily.   Yes Historical Provider, MD  Cholecalciferol (VITAMIN D) 2000 UNITS CAPS Take 1 capsule by mouth daily.    Yes Historical Provider, MD  clonazePAM (KLONOPIN) 0.5 MG tablet Take 0.5 mg by mouth 3 (three) times daily as needed for anxiety (anxiety).    Yes Historical Provider, MD  levothyroxine (SYNTHROID, LEVOTHROID) 50 MCG tablet Take 50 mcg by mouth daily before breakfast.   Yes Historical Provider, MD  Melatonin 5 MG TABS Take 5 mg by mouth at bedtime.    Yes Historical Provider, MD  Multiple Vitamins-Minerals (PRESERVISION AREDS) CAPS Take 1 capsule by mouth daily.   Yes Historical Provider, MD  QUEtiapine (SEROQUEL) 25 MG tablet Take one tablet at bedtime if unable to sleep after one to 2 hours may take a second dose 05/13/14  Yes Collene GobbleSteven A Daub, MD  sertraline (ZOLOFT) 50 MG tablet Take 50 mg by mouth daily.   Yes Historical Provider, MD   Physical Exam: Filed Vitals:   06/13/14 0030  BP: 121/43  Pulse: 94  Temp:   Resp: 24    BP 121/43 mmHg  Pulse 94  Temp(Src) 98.1 F (36.7  C) (Oral)  Resp 24  SpO2 92%  General Appearance:    Alert, seems somewhat confused to me but per daughter this is baseline (question possible mild dementia?), no distress, appears stated age  Head:    Normocephalic, atraumatic  Eyes:    PERRL, EOMI, sclera non-icteric        Nose:   Nares without drainage or epistaxis. Mucosa, turbinates normal  Throat:   Moist mucous membranes. Oropharynx without erythema or exudate.  Neck:   Supple. No carotid bruits.  No thyromegaly.  No lymphadenopathy.   Back:     No CVA tenderness, no spinal tenderness  Lungs:     Clear to auscultation bilaterally, without wheezes, rhonchi or rales  Chest wall:    No tenderness to palpitation  Heart:    Regular  rate and rhythm without murmurs, gallops, rubs  Abdomen:     Soft, non-tender, nondistended, normal bowel sounds, no organomegaly  Genitalia:    deferred  Rectal:    deferred  Extremities:   No clubbing, cyanosis or edema.  Pulses:   2+ and symmetric all extremities  Skin:   Skin color, texture, turgor normal, no rashes or lesions  Lymph nodes:   Cervical, supraclavicular, and axillary nodes normal  Neurologic:   CNII-XII intact. Normal strength, sensation and reflexes      throughout    Labs on Admission:  Basic Metabolic Panel:  Recent Labs Lab 06/12/14 2221 06/12/14 2222  NA  --  142  K  --  4.2  CL  --  109  CO2  --  17*  GLUCOSE  --  106*  BUN  --  10  CREATININE  --  0.81  CALCIUM  --  8.9  MG 1.7  --    Liver Function Tests:  Recent Labs Lab 06/12/14 2222  AST 13  ALT 6  ALKPHOS 51  BILITOT 0.8  PROT 6.4  ALBUMIN 3.2*   No results for input(s): LIPASE, AMYLASE in the last 168 hours. No results for input(s): AMMONIA in the last 168 hours. CBC:  Recent Labs Lab 06/12/14 2007 06/12/14 2222  WBC 24.2* 21.7*  NEUTROABS  --  19.1*  HGB 14.7 13.0  HCT 44.5 39.6  MCV 91.1 90.6  PLT  --  279   Cardiac Enzymes: No results for input(s): CKTOTAL, CKMB, CKMBINDEX, TROPONINI in the last 168 hours.  BNP (last 3 results) No results for input(s): PROBNP in the last 8760 hours. CBG: No results for input(s): GLUCAP in the last 168 hours.  Radiological Exams on Admission: Dg Chest 2 View  06/12/2014   CLINICAL DATA:  Fever, cough, diarrhea.  EXAM: CHEST  2 VIEW  COMPARISON:  05/18/2014.  FINDINGS: Stable borderline enlarged cardiac silhouette. A small to moderate-sized hiatal hernia is again demonstrated. Interval minimal bibasilar atelectasis with a decreased depth of inspiration. Thoracic spine degenerative changes.  IMPRESSION: 1. Decreased inspiration with minimal bibasilar atelectasis. 2. Stable small to moderate-sized hiatal hernia.   Electronically Signed    By: Gordan PaymentSteve  Reid M.D.   On: 06/12/2014 22:29    EKG: Independently reviewed.  Assessment/Plan Principal Problem:   Recurrent Clostridium difficile diarrhea Active Problems:   Leukocytosis   Dehydration   1. Recurrent clostridium difficile diarrhea 1. PCR gi pathogen panel pending but this almost certianly represents a second recurrence of C.Diff given the history. 2. IVF 3. Strict intake and output 4. Vancomycin PO and Rifaxin PO 2. Leukocytosis 1. Recheck CBC in AM 2. Tele  monitor to look for tachycardia, was tachy on presentation but resolved after IVF   Code Status: Full Code  Family Communication: Daughter at bedside Disposition Plan: Admit to inpatient   Time spent: 70 min  Cortasia Screws M. [facility] Pager [phone]  If 7AM-7PM, please contact the day team taking care of the patient [email or web address] Password [email or web address] 06/13/2014, 12:46 AM

## 2014-06-13 NOTE — Plan of Care (Signed)
Problem: Phase I Progression Outcomes Goal: Pain controlled with appropriate interventions Outcome: Completed/Met Date Met:  06/13/14 Goal: OOB as tolerated unless otherwise ordered Outcome: Progressing Goal: Initial discharge plan identified Outcome: Progressing Goal: Voiding-avoid urinary catheter unless indicated Outcome: Progressing Goal: Hemodynamically stable Outcome: Progressing Goal: Other Phase I Outcomes/Goals Outcome: Not Applicable Date Met:  15/95/39

## 2014-06-13 NOTE — Progress Notes (Signed)
Patient ID: Brianna Reynolds, female   DOB: [birth year], 80 y.o.   MRN: [id number] [facility] PROGRESS NOTE  CALIYA NARINE [id number] DOB: 10-31-1923 DOA: 06/12/2014 PCP: Jenny Reichmann, MD  Brief narrative: 33 -year-old female with recent treatment of C. Difficile colitis ( was on by mouth Flagyl and completed treatment 5 days prior to this admission). Patient presented to [facility] ED 06/12/2014 with recurrence of explosive diarrhea started 1 day prior to this admission with low-grade fevers. Patient was seen in [place] urgent care center and was noted to be severely dehydrated with leukocytosis of 24 and she was referred to emergency room for admission. In ED, blood pressure was 88/62, HR 59-118, RR 17-30, T max 99.2 F and oxygen saturation 92% on room air. White blood cell count was 24.2. No acute findings on chest x-ray. Due to concern for recurrence of C. Difficile colitis patient was started on Flagyl and by mouth vancomycin.  Assessment/Plan:    Principal Problem: Sepsis secondary to probable recurrent Clostridium difficile diarrhea / leukocytosis - sepsis criteria met on the admission with vital signs that included hypotension, tachycardia, tachypnea, low-grade fever. In addition patient had elevated white blood cell count of 24. Suspected source of infection - C. Difficile colitis. - Continue Flagyl and vancomycin - Follow-up C. Difficile PCR and GI pathogen panel - white blood cell count is trending down - Continue to provide supportive care with IV fluids Active Problems: Dehydration - likely secondary to sepsis and C. Difficile colitis - Continue supportive care with IV fluids Hypokalemia - Secondary to GI losses. Supplemented. - Follow-up BMP in the morning. Essential hypertension - Blood pressure is 105/44 - please hold Norvasc 10 mg daily until blood pressure improves. Hypothyroidism - Continue Synthroid 50 g daily Depression - Continue Seroquel 25 mg at bedtime  and Zoloft 50 mg daily  DVT Prophylaxis   Heparin subcutaneous ordered  Code Status: Full.  Family Communication:  plan of care discussed with the patientand her family at the bedside Disposition Plan: Home when stable.   IV access:   PeripheralIV  Procedures and diagnostic studies:    Dg Chest 2 View 06/12/2014   1. Decreased inspiration with minimal bibasilar atelectasis. 2. Stable small to moderate-sized hiatal hernia.     Medical Consultants:   None    Other Consultants:   None   IAnti-Infectives:    Flagyl 06/12/2014 -->  Vanco PO 06/12/2014 -->    Leisa Lenz, MD  [facility] Pager [phone]  If 7PM-7AM, please contact night-coverage [email or web address] Password [email or web address] 06/13/2014, 10:05 AM   LOS: 1 day    HPI/Subjective: No acute overnight events.  Objective: Filed Vitals:   06/13/14 0100 06/13/14 0130 06/13/14 0143 06/13/14 0515  BP: 114/44  98/80 105/44  Pulse: 94  91 59  Temp:   98.2 F (36.8 C) 99.2 F (37.3 C)  TempSrc:   Oral Oral  Resp: _0 Height:  5' (1.524 m)    Weight:  60.328 kg (133 lb) 60.328 kg (133 lb)   SpO2: 92%  99% 92%    Intake/Output Summary (Last 24 hours) at 06/13/14 1005 Last data filed at 06/13/14 1004  Gross per 24 hour  Intake 819.58 ml  Output    500 ml  Net 319.58 ml    Exam:   General:  Pt is alert, not in acute distress  Cardiovascular: Regular rate and rhythm, S1/S2 appreciated   Respiratory: Clear to auscultation bilaterally, no wheezing, no crackles,  no rhonchi  Abdomen: Soft, non tender, non distended, bowel sounds present  Extremities: no edema, pulses DP and PT palpable bilaterally; poor skin turgor   Neuro: Grossly nonfocal  Data Reviewed: Basic Metabolic Panel:  Recent Labs Lab 06/12/14 2221 06/12/14 2222 06/13/14 0410  NA  --  142 141  K  --  4.2 3.3*  CL  --  109 108  CO2  --  17* 19  GLUCOSE  --  106* 103*  BUN  --  10 10  CREATININE  --  0.81 0.73  CALCIUM  --   8.9 8.9  MG 1.7  --   --    Liver Function Tests:  Recent Labs Lab 06/12/14 2222  AST 13  ALT 6  ALKPHOS 51  BILITOT 0.8  PROT 6.4  ALBUMIN 3.2*   No results for input(s): LIPASE, AMYLASE in the last 168 hours. No results for input(s): AMMONIA in the last 168 hours. CBC:  Recent Labs Lab 06/12/14 2007 06/12/14 2222 06/13/14 0410  WBC 24.2* 21.7* 17.3*  NEUTROABS  --  19.1*  --   HGB 14.7 13.0 11.9*  HCT 44.5 39.6 37.9  MCV 91.1 90.6 92.0  PLT  --  279 248   Cardiac Enzymes: No results for input(s): CKTOTAL, CKMB, CKMBINDEX, TROPONINI in the last 168 hours. BNP: Invalid input(s): POCBNP CBG: No results for input(s): GLUCAP in the last 168 hours.  No results found for this or any previous visit (from the past 240 hour(s)).   Scheduled Meds: . amLODipine  10 mg Oral Daily  . aspirin EC  81 mg Oral Daily  . atorvastatin  10 mg Oral q1800  . calcium carbonate  1 tablet Oral Daily  . cholecalciferol  2,000 Units Oral Daily  . heparin  5,000 Units Subcutaneous 3 times per day  . levothyroxine  50 mcg Oral QAC breakfast  . vancomycin  500 mg Oral 4 times per day   And  . metronidazole  500 mg Intravenous Q8H  . multivitamin-lutein  1 capsule Oral Daily  . potassium chloride  40 mEq Oral Once  . QUEtiapine  25 mg Oral QHS  . sertraline  50 mg Oral Daily   Continuous Infusions: . sodium chloride 125 mL/hr at 06/13/14 0241

## 2014-06-13 NOTE — Plan of Care (Signed)
Problem: Phase I Progression Outcomes Goal: Initial discharge plan identified Outcome: Completed/Met Date Met:  06/13/14

## 2014-06-14 DIAGNOSIS — E876 Hypokalemia: Secondary | ICD-10-CM

## 2014-06-14 LAB — URINE CULTURE: Colony Count: >=100000

## 2014-06-14 LAB — BASIC METABOLIC PANEL
Anion gap: 12 (ref 5–15)
BUN: 6 mg/dL (ref 6–23)
CO2: 18 meq/L — AB (ref 19–32)
Calcium: 8.6 mg/dL (ref 8.4–10.5)
Chloride: 108 mEq/L (ref 96–112)
Creatinine, Ser: 0.6 mg/dL (ref 0.50–1.10)
GFR calc Af Amer: >90 mL/min (ref >90)
GFR, EST NON AFRICAN AMERICAN: 79 mL/min — AB (ref >90)
GLUCOSE: 82 mg/dL (ref 70–99)
POTASSIUM: 3.9 meq/L (ref 3.7–5.3)
Sodium: 138 mEq/L (ref 137–147)

## 2014-06-14 LAB — CBC
HEMATOCRIT: 37.9 % (ref 36.0–46.0)
Hemoglobin: 12.2 g/dL (ref 12.0–15.0)
MCH: 29.8 pg (ref 26.0–34.0)
MCHC: 32.2 g/dL (ref 30.0–36.0)
MCV: 92.7 fL (ref 78.0–100.0)
Platelets: 204 10*3/uL (ref 150–400)
RBC: 4.09 MIL/uL (ref 3.87–5.11)
RDW: 14.1 % (ref 11.5–15.5)
WBC: 8.6 10*3/uL (ref 4.0–10.5)

## 2014-06-14 MED ORDER — DIPHENHYDRAMINE HCL 12.5 MG/5ML PO ELIX
12.5000 mg | ORAL_SOLUTION | Freq: Once | ORAL | Status: AC
  Administered 2014-06-14: 12.5 mg via ORAL
  Filled 2014-06-14: qty 5

## 2014-06-14 MED ORDER — AMLODIPINE BESYLATE 10 MG PO TABS
10.0000 mg | ORAL_TABLET | Freq: Every day | ORAL | Status: DC
  Administered 2014-06-14 – 2014-06-17 (×4): 10 mg via ORAL
  Filled 2014-06-14 (×4): qty 1

## 2014-06-14 NOTE — Plan of Care (Signed)
Problem: Consults Goal: General Medical Patient Education See Patient Education Module for specific education.  Outcome: Completed/Met Date Met:  06/14/14

## 2014-06-14 NOTE — Plan of Care (Signed)
Problem: Phase I Progression Outcomes Goal: Voiding-avoid urinary catheter unless indicated Outcome: Completed/Met Date Met:  06/14/14     

## 2014-06-14 NOTE — Plan of Care (Signed)
Problem: Phase I Progression Outcomes Goal: OOB as tolerated unless otherwise ordered Outcome: Completed/Met Date Met:  06/14/14  Problem: Phase II Progression Outcomes Goal: Progress activity as tolerated unless otherwise ordered Outcome: Completed/Met Date Met:  06/14/14 Goal: Discharge plan established Outcome: Progressing Goal: Vital signs remain stable Outcome: Completed/Met Date Met:  06/14/14 Goal: IV changed to normal saline lock Outcome: Progressing Goal: Obtain order to discontinue catheter if appropriate Outcome: Not Applicable Date Met:  98/28/67 Goal: Other Phase II Outcomes/Goals Outcome: Progressing  Problem: Phase III Progression Outcomes Goal: Pain controlled on oral analgesia Outcome: Completed/Met Date Met:  06/14/14 Goal: Activity at appropriate level-compared to baseline (UP IN CHAIR FOR HEMODIALYSIS)  Outcome: Progressing Goal: Voiding independently Outcome: Completed/Met Date Met:  06/14/14 Goal: IV/normal saline lock discontinued Outcome: Progressing Goal: Foley discontinued Outcome: Not Applicable Date Met:  51/98/24 Goal: Discharge plan remains appropriate-arrangements made Outcome: Progressing Goal: Other Phase III Outcomes/Goals Outcome: Progressing

## 2014-06-14 NOTE — Progress Notes (Signed)
Patient ID: Brianna Reynolds, female   DOB: 1923/12/03, 80 y.o.   MRN: [id number] [facility] PROGRESS NOTE  Brianna Reynolds [id number] DOB: July 19, 1924 DOA: 06/12/2014 PCP: Jenny Reichmann, MD  Brief narrative: 89 -year-old female with recent treatment of C. Difficile colitis ( was on by mouth Flagyl and completed treatment 5 days prior to this admission). Patient presented to [facility] ED 06/12/2014 with recurrence of explosive diarrhea started 1 day prior to this admission with low-grade fevers. Patient was seen in [place] urgent care center and was noted to be severely dehydrated with leukocytosis of 24 and she was referred to emergency room for admission. In ED, blood pressure was 88/62, HR 59-118, RR 17-30, T max 99.2 F and oxygen saturation 92% on room air. White blood cell count was 24.2. No acute findings on chest x-ray. Due to concern for recurrence of C. Difficile colitis patient was started on Flagyl and by mouth vancomycin. Sure enough her C.diff PCR is positive for C.diff,  Assessment/Plan:    Principal Problem: Sepsis secondary to probable recurrent Clostridium difficile diarrhea / leukocytosis - sepsis criteria met on the admission with vital signs that included hypotension, tachycardia, tachypnea, low-grade fever. In addition patient had elevated white blood cell count of 24. Source of infection - C. Difficile colitis. - Continue Flagyl and vancomycin - C.diff by PCR is positive; GI pathogen panel pending - white blood cell count normalized - Continue to provide supportive care with IV fluids Active Problems: Dehydration - likely secondary to sepsis and C. Difficile colitis - Continue supportive care with IV fluids Hypokalemia - Secondary to GI losses. Supplemented. - potassium WNL Essential hypertension - Blood pressure 131/50 -- resume Norvasc daily  Hypothyroidism - Continue Synthroid 50 g daily Depression - Continue Seroquel 25 mg at bedtime and Zoloft 50 mg  daily  DVT Prophylaxis   Heparin subcutaneous ordered  Code Status: Full.  Family Communication: plan of care discussed with the patient and her family at the bedside Disposition Plan: Home when stable.   IV access:   PeripheralIV  Procedures and diagnostic studies:   Dg Chest 2 View 06/12/2014 1. Decreased inspiration with minimal bibasilar atelectasis. 2. Stable small to moderate-sized hiatal hernia.   Medical Consultants:   None  Other Consultants:   None  IAnti-Infectives:    Flagyl 06/12/2014 -->  Vanco PO 06/12/2014 -->    Leisa Lenz, MD  [facility] Pager [phone]  If 7PM-7AM, please contact night-coverage [email or web address] Password [email or web address] 06/14/2014, 8:06 AM   LOS: 2 days    HPI/Subjective: No acute overnight events.  Objective: Filed Vitals:   06/13/14 0515 06/13/14 1414 06/13/14 2054 06/14/14 0512  BP: 105/44 104/41 103/66 118/48  Pulse: 59 82 88 82  Temp: 99.2 F (37.3 C) 98.4 F (36.9 C) 98 F (36.7 C) 98.1 F (36.7 C)  TempSrc: Oral Oral Oral Oral  Resp: 20 18 20 20   Height:      Weight:      SpO2: 92% 93% 95% 93%    Intake/Output Summary (Last 24 hours) at 06/14/14 4081 Last data filed at 06/14/14 0600  Gross per 24 hour  Intake 2599.17 ml  Output      0 ml  Net 2599.17 ml    Exam:   General: Pt is not in acute distress  Cardiovascular: Regular rate and rhythm, S1/S2 appreciated   Respiratory: no wheezing, no crackles, no rhonchi  Abdomen: non tender, non distended, bowel sounds present  Extremities: poor skin turgor ;  no edema  Neuro: nonfocal  Data Reviewed: Basic Metabolic Panel:  Recent Labs Lab 06/12/14 2221 06/12/14 2222 06/13/14 0410 06/14/14 0500  NA  --  142 141 138  K  --  4.2 3.3* 3.9  CL  --  109 108 108  CO2  --  17* 19 18*  GLUCOSE  --  106* 103* 82  BUN  --  10 10 6   CREATININE  --  0.81 0.73 0.60  CALCIUM  --  8.9 8.9 8.6  MG 1.7  --   --   --    Liver  Function Tests:  Recent Labs Lab 06/12/14 2222  AST 13  ALT 6  ALKPHOS 51  BILITOT 0.8  PROT 6.4  ALBUMIN 3.2*   No results for input(s): LIPASE, AMYLASE in the last 168 hours. No results for input(s): AMMONIA in the last 168 hours. CBC:  Recent Labs Lab 06/12/14 2007 06/12/14 2222 06/13/14 0410 06/14/14 0615  WBC 24.2* 21.7* 17.3* 8.6  NEUTROABS  --  19.1*  --   --   HGB 14.7 13.0 11.9* 12.2  HCT 44.5 39.6 37.9 37.9  MCV 91.1 90.6 92.0 92.7  PLT  --  279 248 204   Cardiac Enzymes: No results for input(s): CKTOTAL, CKMB, CKMBINDEX, TROPONINI in the last 168 hours. BNP: Invalid input(s): POCBNP CBG: No results for input(s): GLUCAP in the last 168 hours.  Recent Results (from the past 240 hour(s))  Urine culture     Status: None   Collection Time: 06/13/14 12:16 AM  Result Value Ref Range Status   Specimen Description URINE, CLEAN CATCH  Final   Special Requests NONE  Final   Culture  Setup Time   Final    06/13/2014 03:25 Performed at [place]   Final    >=100,000 COLONIES/ML Performed at [facility]    Culture   Final    Multiple bacterial morphotypes present, none predominant. Suggest appropriate recollection if clinically indicated. Performed at [facility]    Report Status 06/14/2014 FINAL  Final  Clostridium Difficile by PCR     Status: Abnormal   Collection Time: 06/13/14 10:00 AM  Result Value Ref Range Status   C difficile by pcr POSITIVE (A) NEGATIVE Final    Comment: CRITICAL RESULT CALLED TO, READ BACK BY AND VERIFIED WITHElane Fritz RN @ 0454 06/13/14 LEONARD,A Performed at [facility]      Scheduled Meds: . aspirin EC  81 mg Oral Daily  . atorvastatin  10 mg Oral q1800  . calcium carbonate  1 tablet Oral Daily  . cholecalciferol  2,000 Units Oral Daily  . heparin  5,000 Units Subcutaneous 3 times per day  . levothyroxine  50 mcg Oral QAC breakfast  . vancomycin  500 mg Oral 4  times per day   And  . metronidazole  500 mg Intravenous Q8H  . multivitamin-lutein  1 capsule Oral Daily  . QUEtiapine  25 mg Oral QHS  . sertraline  50 mg Oral Daily  . sodium chloride  3 mL Intravenous Q12H   Continuous Infusions: . sodium chloride 75 mL/hr at 06/14/14 0100

## 2014-06-15 MED ORDER — DIPHENHYDRAMINE HCL 12.5 MG/5ML PO ELIX
12.5000 mg | ORAL_SOLUTION | Freq: Once | ORAL | Status: AC
  Administered 2014-06-15: 12.5 mg via ORAL
  Filled 2014-06-15: qty 5

## 2014-06-15 MED ORDER — OCUVITE PO TABS
1.0000 | ORAL_TABLET | Freq: Every day | ORAL | Status: DC
  Administered 2014-06-15 – 2014-06-17 (×3): 1 via ORAL
  Filled 2014-06-15 (×3): qty 1

## 2014-06-15 NOTE — Progress Notes (Signed)
Pt repeatedly forgetting things. Pt asks for sleeping pill which she already received as well a benedryl to help her sleep. Pt states she only sleep on her side and asks for staff to place her on her side. Pt was already on her right side and asked to be placed on her right side. RN told patient she was on that side and patient insisted that she be placed on her right side. RN attempted to adjust patient and then patient stated she was on her back when she was clearly on her right side. Pt has had increased confusion since daughter left. [facility] Bed alarm placed on patient. Will continue to monitor.

## 2014-06-15 NOTE — Plan of Care (Signed)
Problem: Phase II Progression Outcomes Goal: Discharge plan established Outcome: Progressing Goal: IV changed to normal saline lock Outcome: Completed/Met Date Met:  06/15/14  Problem: Phase III Progression Outcomes Goal: Activity at appropriate level-compared to baseline (UP IN CHAIR FOR HEMODIALYSIS)  Outcome: Progressing Goal: IV/normal saline lock discontinued Outcome: Completed/Met Date Met:  06/15/14 Goal: Discharge plan remains appropriate-arrangements made Outcome: Progressing Goal: Other Phase III Outcomes/Goals Outcome: Not Progressing

## 2014-06-15 NOTE — Progress Notes (Signed)
Patient ID: Brianna Reynolds, female   DOB: 01/27/1924, 80 y.o.   MRN: [id number] [facility] PROGRESS NOTE  Brianna Reynolds [id number] DOB: [birth year] DOA: 06/12/2014 PCP: Jenny Reichmann, MD  Brief narrative: 22 -year-old female with recent treatment of C. Difficile colitis ( was on by mouth Flagyl and completed treatment 5 days prior to this admission). Patient presented to [facility] ED 06/12/2014 with recurrence of explosive diarrhea started 1 day prior to this admission with low-grade fevers. Patient was seen in [place] urgent care center and was noted to be severely dehydrated with leukocytosis of 24 and she was referred to emergency room for admission. In ED, blood pressure was 88/62, HR 59-118, RR 17-30, T max 99.2 F and oxygen saturation 92% on room air. White blood cell count was 24.2. No acute findings on chest x-ray. She was found to have C.diff on PCR and since this is recurrence of C.diff she is on flagyl and vanco.   Assessment/Plan:     Principal Problem: Sepsis secondary to probable recurrent Clostridium difficile diarrhea / leukocytosis - sepsis criteria met on the admission with vital signs that included hypotension, tachycardia, tachypnea, low-grade fever. In addition patient had elevated white blood cell count of 24. Source of infection - C. Difficile colitis. - Continue Flagyl and vancomycin - C.diff by PCR is positive; GI pathogen panel positive for C.diff. - blood cultures to date are negative. - WBC count normalized  - eating better so we will stop IV fluids  Active Problems: Dehydration - likely secondary to sepsis and C. Difficile colitis - resolving with IV fluids and antibiotics for C.diff  - diarrhea slowed down  Hypokalemia - Secondary to GI losses. Supplemented. - potassium WNL Essential hypertension - BP stable; continue Norvasc daily  Hypothyroidism - Continue Synthroid 50 g daily Depression - Continue Seroquel 25 mg at bedtime and Zoloft 50 mg  daily  DVT Prophylaxis   Heparin subcutaneous ordered  Code Status: Full.  Family Communication: plan of care discussed with the patient and her family at the bedside Disposition Plan: needs PT evaluation for safe discharge    IV access:   Peripheral IV  Procedures and diagnostic studies:    Dg Chest 2 View 06/12/2014 1. Decreased inspiration with minimal bibasilar atelectasis. 2. Stable small to moderate-sized hiatal hernia.   Medical Consultants:   None  Other Consultants:   Physical therapy  IAnti-Infectives:    Flagyl 06/12/2014 -->  Vanco PO 06/12/2014 -->   Leisa Lenz, MD  [facility] Pager [phone]  If 7PM-7AM, please contact night-coverage [email or web address] Password [email or web address] 06/15/2014, 12:09 PM   LOS: 3 days    HPI/Subjective: No acute overnight events.  Objective: Filed Vitals:   06/14/14 0512 06/14/14 1306 06/14/14 2100 06/15/14 0616  BP: 118/48 131/50 124/54 114/42  Pulse: 82 82 83 80  Temp: 98.1 F (36.7 C) 98 F (36.7 C) 97.8 F (36.6 C) 98.1 F (36.7 C)  TempSrc: Oral Oral Oral Oral  Resp: 20 20 20 20   Height:      Weight:      SpO2: 93% 98% 93% 97%    Intake/Output Summary (Last 24 hours) at 06/15/14 1209 Last data filed at 06/15/14 0600  Gross per 24 hour  Intake   2460 ml  Output      0 ml  Net   2460 ml    Exam:   General:  Pt is more alert, no distress  Cardiovascular: Regular rate and rhythm, S1/S2, no murmurs  Respiratory: no wheezing, no crackles, no rhonchi  Abdomen: Soft, non tender, non distended, bowel sounds present  Extremities: No edema, pulses DP and PT palpable bilaterally   Data Reviewed: Basic Metabolic Panel:  Recent Labs Lab 06/12/14 2221 06/12/14 2222 06/13/14 0410 06/14/14 0500  NA  --  142 141 138  K  --  4.2 3.3* 3.9  CL  --  109 108 108  CO2  --  17* 19 18*  GLUCOSE  --  106* 103* 82  BUN  --  10 10 6   CREATININE  --  0.81 0.73 0.60  CALCIUM  --  8.9 8.9 8.6  MG  1.7  --   --   --    Liver Function Tests:  Recent Labs Lab 06/12/14 2222  AST 13  ALT 6  ALKPHOS 51  BILITOT 0.8  PROT 6.4  ALBUMIN 3.2*   No results for input(s): LIPASE, AMYLASE in the last 168 hours. No results for input(s): AMMONIA in the last 168 hours. CBC:  Recent Labs Lab 06/12/14 2007 06/12/14 2222 06/13/14 0410 06/14/14 0615  WBC 24.2* 21.7* 17.3* 8.6  NEUTROABS  --  19.1*  --   --   HGB 14.7 13.0 11.9* 12.2  HCT 44.5 39.6 37.9 37.9  MCV 91.1 90.6 92.0 92.7  PLT  --  279 248 204   Cardiac Enzymes: No results for input(s): CKTOTAL, CKMB, CKMBINDEX, TROPONINI in the last 168 hours. BNP: Invalid input(s): POCBNP CBG: No results for input(s): GLUCAP in the last 168 hours.  Blood culture (routine x 2)     Status: None (Preliminary result)   Collection Time: 06/12/14 10:22 PM  Result Value Ref Range Status   Specimen Description BLOOD RIGHT ANTECUBITAL  Final   Culture  Setup Time   Final   Culture   Final           BLOOD CULTURE RECEIVED NO GROWTH TO DATE    Report Status PENDING  Incomplete  Blood culture (routine x 2)     Status: None (Preliminary result)   Collection Time: 06/12/14 10:22 PM  Result Value Ref Range Status   Specimen Description BLOOD LEFT HAND  Final   Culture  Setup Time   Final   Culture   Final           BLOOD CULTURE RECEIVED NO GROWTH TO DATE    Report Status PENDING  Incomplete  Urine culture     Status: None   Collection Time: 06/13/14 12:16 AM  Result Value Ref Range Status   Specimen Description URINE, CLEAN CATCH  Final   Special Requests NONE  Final   Culture  Setup Time   Final   Colony Count   Final    >=100,000 COLONIES/ML   Report Status 06/14/2014 FINAL  Final  Clostridium Difficile by PCR     Status: Abnormal   Collection Time: 06/13/14 10:00 AM  Result Value Ref Range Status   C difficile by pcr POSITIVE (A) NEGATIVE Final     Scheduled Meds: . amLODipine  10 mg Oral Daily  . aspirin EC  81 mg Oral Daily   . atorvastatin  10 mg Oral q1800  . beta carotene w/mine  1 tablet Oral Daily  . calcium carbonate  1 tablet Oral Daily  . cholecalciferol  2,000 Units Oral Daily  . levothyroxine  50 mcg Oral QAC breakfast  . vancomycin  500 mg Oral 4 times per day   And  .  metronidazole  500 mg Intravenous Q8H  . QUEtiapine  25 mg Oral QHS  . sertraline  50 mg Oral Daily

## 2014-06-16 NOTE — Evaluation (Signed)
Physical Therapy Evaluation Patient Details Name: Brianna Reynolds D Drotar MRN: [id number] DOB: 02-03-1924 Today's Date: 06/16/2014   History of Present Illness  80 -year-old female with recent treatment of C. Difficile colitis;  [facility] with recurrence of  diarrhea started 1 day prior to this admission with low-grade fevers; PMHx: depression  Clinical Impression  Pt will benefit from PT to address deficits below; have encouraged pt to amb and be OOB again later, family has been helping her to bathroom and to chair     Follow Up Recommendations Home health PT;Supervision for mobility/OOB    Equipment Recommendations  None recommended by PT    Recommendations for Other Services       Precautions / Restrictions        Mobility  Bed Mobility Overal bed mobility: Modified Independent                Transfers Overall transfer level: Needs assistance Equipment used: None;1 person hand held assist Transfers: Sit to/from Stand Sit to Stand: Supervision         General transfer comment: for safety  Ambulation/Gait Ambulation/Gait assistance: Supervision Ambulation Distance (Feet): 80 Feet (10' more) Assistive device: None Gait Pattern/deviations: Step-through pattern;Decreased stride length     General Gait Details: cues for upright posture  Stairs            Wheelchair Mobility    Modified Rankin (Stroke Patients Only)       Balance Overall balance assessment: Needs assistance   Sitting balance-Leahy Scale: Good     Standing balance support: During functional activity;No upper extremity supported Standing balance-Leahy Scale: Good Standing balance comment: no overt LOB but pt appears unsteady intially; pt does not want to amb but does so with encouragement             High level balance activites: Direction changes;Turns               Pertinent Vitals/Pain Pain Assessment: No/denies pain    Home Living  Family/patient expects to be discharged to:: Private residence Living Arrangements: Spouse/significant other Available Help at Discharge: Family Type of Home: House Home Access: Stairs to enter   Enter[employer] CorporationEntrance Stairs-Number of Steps: 5 Home Layout: Two level;Able to live on main level with bedroom/bathroom Home Equipment: None Additional Comments: pt would not use RW if she had it per dtr    Prior Function Level of Independence: Independent               Hand Dominance        Extremity/Trunk Assessment   Upper Extremity Assessment: Overall WFL for tasks assessed           Lower Extremity Assessment: RLE deficits/detail;LLE deficits/detail         Communication   Communication: No difficulties  Cognition Arousal/Alertness: Awake/alert Behavior During Therapy: Flat affect Overall Cognitive Status: Within Functional Limits for tasks assessed                      General Comments      Exercises        Assessment/Plan    PT Assessment Patient needs continued PT services  PT Diagnosis Difficulty walking   PT Problem List Decreased balance;Decreased mobility;Decreased activity tolerance  PT Treatment Interventions DME instruction;Gait training;Stair training;Functional mobility training;Therapeutic activities;Therapeutic exercise;Patient/family education   PT Goals (Current goals can be found in the Care Plan section) Acute Rehab PT Goals Patient Stated Goal: pt states she  wants to die, RN aware PT Goal Formulation: With patient/family Time For Goal Achievement: 06/23/14 Potential to Achieve Goals: Good    Frequency Min 3X/week   Barriers to discharge        Co-evaluation               End of Session Equipment Utilized During Treatment: Gait belt Activity Tolerance: Patient tolerated treatment well Patient left: in bed;with call bell/phone within reach;with family/visitor present           Time: 2952-84130948-1012 PT Time Calculation (min)  (ACUTE ONLY): 24 min   Charges:   PT Evaluation $Initial PT Evaluation Tier I: 1 Procedure PT Treatments $Gait Training: 8-22 mins   PT G Codes:          Landra Howze 06/16/2014, 2:01 PM

## 2014-06-16 NOTE — Progress Notes (Signed)
Brianna Reynolds ID: Brianna Reynolds, female   DOB: 1923-08-06, 80 y.o.   MRN: [id number] [facility] PROGRESS NOTE  ZAKYAH YANES YBO:[id number] DOB: June 12, 1924 DOA: 06/12/2014 PCP: Jenny Reichmann, MD  Brief narrative: 79 -year-old female with recent treatment of C. Difficile colitis ( was on by mouth Flagyl and completed treatment 5 days prior to this admission). Brianna Reynolds presented to [facility] ED 06/12/2014 with recurrence of explosive diarrhea started 1 day prior to this admission with low-grade fevers. Brianna Reynolds was seen in [place] urgent care center and was noted to be severely dehydrated with leukocytosis of 24 and she was referred to emergency room for admission. In ED, blood pressure was 88/62, HR 59-118, RR 17-30, T max 99.2 F and oxygen saturation 92% on room air. White blood cell count was 24.2. No acute findings on chest x-ray. She was found to have C.diff on PCR and since this is recurrence of C.diff she is on flagyl and vanco.    Assessment/Plan:    Principal Problem: Sepsis secondary to probable recurrent Clostridium difficile diarrhea / leukocytosis - sepsis criteria met on the admission with vital signs that included hypotension, tachycardia, tachypnea, low-grade fever. In addition Brianna Reynolds had elevated white blood cell count of 24. Source of infection - C. Difficile colitis. - Continue Flagyl and vancomycin - had 3 loose stools in past 24 hours; no diarrhea  - C.diff by PCR is positive; GI pathogen panel positive for C.diff. - blood cultures to date are negative. - WBC count normalized   Active Problems: Dehydration - likely secondary to sepsis and C. Difficile colitis - resolving with IV fluids and antibiotics for C.diff  Hypokalemia - Secondary to GI losses. Supplemented. - potassium WNL Essential hypertension - Continue Norvasc daily  Hypothyroidism - Continue Synthroid 50 g daily Depression - Continue Seroquel 25 mg at bedtime and Zoloft 50 mg daily  DVT  Prophylaxis   Heparin subcutaneous ordered  Code Status: Full.  Family Communication: plan of care discussed with the Brianna Reynolds and her family at the bedside Disposition Plan: needs Brianna Reynolds evaluation for safe discharge; likely home in next 24 hours    IV access:   Peripheral IV  Procedures and diagnostic studies:   Dg Chest 2 View 06/12/2014 1. Decreased inspiration with minimal bibasilar atelectasis. 2. Stable small to moderate-sized hiatal hernia.   Medical Consultants:   None  Other Consultants:   Physical therapy  IAnti-Infectives:    Flagyl 06/12/2014 -->  Vanco PO 06/12/2014 -->  Leisa Lenz, MD  [facility] Pager [phone]  If 7PM-7AM, please contact night-coverage [email or web address] Password [email or web address] 06/16/2014, 10:02 AM   LOS: 4 days    HPI/Subjective: No acute overnight events.  Objective: Filed Vitals:   06/15/14 1500 06/15/14 2043 06/16/14 0517 06/16/14 0857  BP: 106/48 99/43 119/48 114/38  Pulse: 78 56 77   Temp: 97.9 F (36.6 C) 97.9 F (36.6 C) 97.7 F (36.5 C)   TempSrc: Oral Oral Oral   Resp: 20 20 18    Height:      Weight:      SpO2: 95% 98% 97%     Intake/Output Summary (Last 24 hours) at 06/16/14 1002 Last data filed at 06/15/14 2200  Gross per 24 hour  Intake    820 ml  Output      0 ml  Net    820 ml    Exam:   General:  Brianna Reynolds is alert, follows commands appropriately, not in acute distress  Cardiovascular: Regular rate and rhythm, S1/S2  appreciated   Respiratory: Clear to auscultation bilaterally, no wheezing, no crackles, no rhonchi  Abdomen: Soft, non tender, non distended, bowel sounds present  Extremities: No edema, pulses DP and Brianna Reynolds palpable bilaterally  Neuro: Grossly nonfocal  Data Reviewed: Basic Metabolic Panel:  Recent Labs Lab 06/12/14 2221 06/12/14 2222 06/13/14 0410 06/14/14 0500  NA  --  142 141 138  K  --  4.2 3.3* 3.9  CL  --  109 108 108  CO2  --  17* 19 18*  GLUCOSE  --   106* 103* 82  BUN  --  10 10 6   CREATININE  --  0.81 0.73 0.60  CALCIUM  --  8.9 8.9 8.6  MG 1.7  --   --   --    Liver Function Tests:  Recent Labs Lab 06/12/14 2222  AST 13  ALT 6  ALKPHOS 51  BILITOT 0.8  PROT 6.4  ALBUMIN 3.2*   No results for input(s): LIPASE, AMYLASE in the last 168 hours. No results for input(s): AMMONIA in the last 168 hours. CBC:  Recent Labs Lab 06/12/14 2007 06/12/14 2222 06/13/14 0410 06/14/14 0615  WBC 24.2* 21.7* 17.3* 8.6  NEUTROABS  --  19.1*  --   --   HGB 14.7 13.0 11.9* 12.2  HCT 44.5 39.6 37.9 37.9  MCV 91.1 90.6 92.0 92.7  PLT  --  279 248 204   Cardiac Enzymes: No results for input(s): CKTOTAL, CKMB, CKMBINDEX, TROPONINI in the last 168 hours. BNP: Invalid input(s): POCBNP CBG: No results for input(s): GLUCAP in the last 168 hours.  Recent Results (from the past 240 hour(s))  Blood culture (routine x 2)     Status: None (Preliminary result)   Collection Time: 06/12/14 10:22 PM  Result Value Ref Range Status   Specimen Description BLOOD RIGHT ANTECUBITAL  Final   Special Requests BOTTLES DRAWN AEROBIC AND ANAEROBIC 5CC  Final   Culture  Setup Time   Final    06/13/2014 03:06 Performed at [facility]    Culture   Final           BLOOD CULTURE RECEIVED NO GROWTH TO DATE CULTURE WILL BE HELD FOR 5 DAYS BEFORE ISSUING A FINAL NEGATIVE REPORT Performed at [facility]    Report Status PENDING  Incomplete  Blood culture (routine x 2)     Status: None (Preliminary result)   Collection Time: 06/12/14 10:22 PM  Result Value Ref Range Status   Specimen Description BLOOD LEFT HAND  Final   Special Requests BOTTLES DRAWN AEROBIC AND ANAEROBIC 3CC  Final   Culture  Setup Time   Final    06/13/2014 03:06 Performed at [facility]    Culture   Final           BLOOD CULTURE RECEIVED NO GROWTH TO DATE CULTURE WILL BE HELD FOR 5 DAYS BEFORE ISSUING A FINAL NEGATIVE REPORT Performed at [facility]    Report Status PENDING  Incomplete  Urine culture     Status: None   Collection Time: 06/13/14 12:16 AM  Result Value Ref Range Status   Specimen Description URINE, CLEAN CATCH  Final   Special Requests NONE  Final   Culture  Setup Time   Final    06/13/2014 03:25 Performed at [place]   Final    >=100,000 COLONIES/ML Performed at [facility]   Final  Multiple bacterial morphotypes present, none predominant. Suggest appropriate recollection if clinically indicated. Performed at [facility]    Report Status 06/14/2014 FINAL  Final  Clostridium Difficile by PCR     Status: Abnormal   Collection Time: 06/13/14 10:00 AM  Result Value Ref Range Status   C difficile by pcr POSITIVE (A) NEGATIVE Final    Comment: CRITICAL RESULT CALLED TO, READ BACK BY AND VERIFIED WITHElane Fritz RN @ 2197 06/13/14 LEONARD,A Performed at [facility]      Scheduled Meds: . amLODipine  10 mg Oral Daily  . aspirin EC  81 mg Oral Daily  . atorvastatin  10 mg Oral q1800  . beta carotene w/minerals  1 tablet Oral Daily  . calcium carbonate  1 tablet Oral Daily  . cholecalciferol  2,000 Units Oral Daily  . levothyroxine  50 mcg Oral QAC breakfast  . vancomycin  500 mg Oral 4 times per day   And  . metronidazole  500 mg Intravenous Q8H  . QUEtiapine  25 mg Oral QHS  . sertraline  50 mg Oral Daily  . sodium chloride  3 mL Intravenous Q12H   Continuous Infusions: . sodium chloride 10 mL/hr at 06/15/14 [phone]

## 2014-06-16 NOTE — Plan of Care (Signed)
Problem: Phase II Progression Outcomes Goal: Discharge plan established Outcome: Completed/Met Date Met:  06/16/14 Goal: Other Phase II Outcomes/Goals Outcome: Not Applicable Date Met:  73/54/30  Problem: Phase III Progression Outcomes Goal: Activity at appropriate level-compared to baseline (UP IN CHAIR FOR HEMODIALYSIS)  Outcome: Progressing Goal: Discharge plan remains appropriate-arrangements made Outcome: Progressing Goal: Other Phase III Outcomes/Goals Outcome: Not Applicable Date Met:  14/84/03

## 2014-06-17 DIAGNOSIS — R059 Cough, unspecified: Secondary | ICD-10-CM | POA: Insufficient documentation

## 2014-06-17 DIAGNOSIS — R05 Cough: Secondary | ICD-10-CM

## 2014-06-17 MED ORDER — METRONIDAZOLE 500 MG PO TABS: 500.0000 mg | ORAL_TABLET | Freq: Three times a day (TID) | ORAL | Status: DC

## 2014-06-17 MED ORDER — CLONAZEPAM 0.5 MG PO TABS: 0.5000 mg | ORAL_TABLET | Freq: Three times a day (TID) | ORAL | Status: DC | PRN

## 2014-06-17 MED ORDER — VANCOMYCIN HCL 125 MG PO CAPS: 125.0000 mg | ORAL_CAPSULE | Freq: Four times a day (QID) | ORAL | Status: DC

## 2014-06-17 NOTE — Plan of Care (Signed)
Problem: Phase III Progression Outcomes Goal: Activity at appropriate level-compared to baseline (UP IN CHAIR FOR HEMODIALYSIS)  Outcome: Completed/Met Date Met:  06/17/14 Goal: Discharge plan remains appropriate-arrangements made Outcome: Completed/Met Date Met:  06/17/14  Problem: Discharge Progression Outcomes Goal: Discharge plan in place and appropriate Outcome: Completed/Met Date Met:  06/17/14 Goal: Pain controlled with appropriate interventions Outcome: Completed/Met Date Met:  06/17/14 Goal: Hemodynamically stable Outcome: Completed/Met Date Met:  98/42/10 Goal: Complications resolved/controlled Outcome: Completed/Met Date Met:  06/17/14 Goal: Tolerating diet Outcome: Completed/Met Date Met:  06/17/14 Goal: Activity appropriate for discharge plan Outcome: Completed/Met Date Met:  06/17/14 Goal: Other Discharge Outcomes/Goals Outcome: Completed/Met Date Met:  06/17/14

## 2014-06-17 NOTE — Discharge Instructions (Signed)
Clostridium Difficile Infection °Clostridium difficile (C. difficile) is a germ found in the intestines. C. difficile infection can occur after taking some medicines. C. difficile infection can cause watery poop (diarrhea) or severe disease. °HOME CARE °· Drink enough fluids to keep your pee (urine) clear or pale yellow. Avoid milk, caffeine, and alcohol. °· Ask your doctor how to replace body fluid losses (rehydrate). °· Eat small meals more often rather than large meals. °· Take your medicine (antibiotics) as told. Finish it even if you start to feel better. °· Do not  use medicines to slow the watery poop. °· Wash your hands well after using the bathroom and before preparing food. °· Make sure people who live with you wash their hands often. °· Clean all surfaces. Use a product that contains chlorine bleach. °GET HELP RIGHT AWAY IF:  °· The watery poop does not stop, or it comes back after you finish your medicine. °· You feel very dry or thirsty (dehydrated). °· You have a fever. °· You have more belly (abdominal) pain or tenderness. °· There is blood in your poop (stool), or your poop is black and tar-like. °· You cannot eat food or drink liquids without throwing up (vomiting). °MAKE SURE YOU: °· Understand these instructions. °· Will watch your condition. °· Will get help right away if you are not doing well or get worse. °Document Released: 05/15/2009 Document Revised: 12/02/2013 Document Reviewed: 12/24/2010 °[employer]® Patient Information ©2015 [employer]. This information is not intended to replace advice given to you by your health care provider. Make sure you discuss any questions you have with your health care provider. ° °

## 2014-06-17 NOTE — Discharge Summary (Signed)
Physician Discharge Summary  Brianna Reynolds LGX:[id number] DOB: 03-Apr-1924 DOA: 06/12/2014  PCP: Jenny Reichmann, MD  Admit date: 06/12/2014 Discharge date: 06/17/2014  Recommendations for Outpatient Follow-up:  Vancomycin taper Take 125 mg 4 times daily for 7 days followed by 125 mg twice daily for 7 days, followed by 125 mg daily for 7 days, followed by 125 mg every other day for 7 days, followed by 125 mg every 72 hours for 14 days and then stop. Continue Flagyl 500 mg 3 times daily for total of 9 more days. This will complete total of 14 days treatment with Flagyl.  Discharge Diagnoses:  Principal Problem:   Recurrent Clostridium difficile diarrhea Active Problems:   Leukocytosis   Dehydration   Blood poisoning   Cough    Discharge Condition: stable   Diet recommendation: as tolerated   History of present illness:  80 -year-old female with recent treatment of C. Difficile colitis ( was on by mouth Flagyl and completed treatment 5 days prior to this admission). Patient presented to [facility] ED 06/12/2014 with recurrence of explosive diarrhea started 1 day prior to this admission with low-grade fevers. Patient was seen in [place] urgent care center and was noted to be severely dehydrated with leukocytosis of 24 and she was referred to emergency room for admission. In ED, blood pressure was 88/62, HR 59-118, RR 17-30, T max 99.2 F and oxygen saturation 92% on room air. White blood cell count was 24.2. No acute findings on chest x-ray. She was found to have C.diff on PCR and since this is recurrence of C.diff she is on flagyl and vanco.    Assessment/Plan:    Principal Problem: Sepsis secondary to probable recurrent Clostridium difficile diarrhea / leukocytosis - sepsis criteria met on the admission with vital signs that included hypotension, tachycardia, tachypnea, low-grade fever. In addition patient had elevated white blood cell count of 24. Source of infection - C. Difficile  colitis. - Continue Flagyl and vancomycin as prescribed (noted above) - C.diff by PCR is positive; GI pathogen panel positive for C.diff. - blood cultures to date are negative. - WBC count normalized   Active Problems: Dehydration - likely secondary to sepsis and C. Difficile colitis - resolved with IV fluids and antibiotics for C.diff  Hypokalemia - Secondary to GI losses. Supplemented. - potassium WNL Essential hypertension - Continue Norvasc daily  Hypothyroidism - Continue Synthroid 50 g daily Depression - Continue Seroquel 25 mg at bedtime and Zoloft 50 mg daily  DVT Prophylaxis   Heparin subcutaneous ordered while pt is in hospital   Code Status: Full.  Family Communication: plan of care discussed with the patient and her family at the bedside    IV access:   Peripheral IV  Procedures and diagnostic studies:   Dg Chest 2 View 06/12/2014 1. Decreased inspiration with minimal bibasilar atelectasis. 2. Stable small to moderate-sized hiatal hernia.   Medical Consultants:   None  Other Consultants:   Physical therapy  IAnti-Infectives:    Flagyl 06/12/2014 --> as prescribed  Vanco PO 06/12/2014 --> as prescribed    Signed:  Leisa Lenz, MD  [facility] 06/17/2014, 9:59 AM  Pager #: [phone]   Discharge Exam: Filed Vitals:   06/17/14 0450  BP: 119/43  Pulse: 72  Temp: 98.1 F (36.7 C)  Resp: 18   Filed Vitals:   06/16/14 0857 06/16/14 1317 06/16/14 2107 06/17/14 0450  BP: 114/38 104/42 120/88 119/43  Pulse:  73 68 72  Temp:  98.4  F (36.9 C) 99.1 F (37.3 C) 98.1 F (36.7 C)  TempSrc:  Oral Oral Oral  Resp:  _0 Height:      Weight:      SpO2:  97% 100% 95%    General: Pt is alert, follows commands appropriately, not in acute distress Cardiovascular: Regular rate and rhythm, S1/S2 +, no murmurs Respiratory: Clear to auscultation bilaterally, no wheezing, no crackles, no  rhonchi Abdominal: Soft, non tender, non distended, bowel sounds +, no guarding Extremities: no edema, no cyanosis, pulses palpable bilaterally DP and PT Neuro: Grossly nonfocal  Discharge Instructions  Discharge Instructions    Call MD for:  difficulty breathing, headache or visual disturbances    Complete by:  As directed      Call MD for:  persistant dizziness or light-headedness    Complete by:  As directed      Call MD for:  persistant nausea and vomiting    Complete by:  As directed      Call MD for:  severe uncontrolled pain    Complete by:  As directed      Diet - low sodium heart healthy    Complete by:  As directed      Discharge instructions    Complete by:  As directed   Vancomycin taper Take 125 mg 4 times daily for 7 days followed by 125 mg twice daily for 7 days, followed by 125 mg daily for 7 days, followed by 125 mg every other day for 7 days, followed by 125 mg every 72 hours for 14 days and then stop. Continue Flagyl 500 mg 3 times daily for total of 9 more days. This will complete total of 14 days treatment with Flagyl.     Increase activity slowly    Complete by:  As directed             Medication List    TAKE these medications        acetaminophen 325 MG tablet  Commonly known as:  TYLENOL  Take 650 mg by mouth every 6 (six) hours as needed for headache (headache).     amLODipine 10 MG tablet  Commonly known as:  NORVASC  Take 10 mg by mouth daily.     aspirin EC 81 MG tablet  Take 81 mg by mouth daily.     atorvastatin 10 MG tablet  Commonly known as:  LIPITOR  Take 10 mg by mouth daily.     CALTRATE 600 PO  Take 1 tablet by mouth daily.     clonazePAM 0.5 MG tablet  Commonly known as:  KLONOPIN  Take 1 tablet (0.5 mg total) by mouth 3 (three) times daily as needed for anxiety (anxiety).     levothyroxine 50 MCG tablet  Commonly known as:  SYNTHROID, LEVOTHROID  Take 50 mcg by mouth daily before breakfast.     Melatonin 5 MG Tabs   Take 5 mg by mouth at bedtime.     metroNIDAZOLE 500 MG tablet  Commonly known as:  FLAGYL  Take 1 tablet (500 mg total) by mouth 3 (three) times daily.     PRESERVISION AREDS Caps  Take 1 capsule by mouth daily.     QUEtiapine 25 MG tablet  Commonly known as:  SEROQUEL  Take one tablet at bedtime if unable to sleep after one to 2 hours may take a second dose     sertraline 50 MG tablet  Commonly known as:  ZOLOFT  Take 50 mg by mouth daily.     vancomycin 125 MG capsule  Commonly known as:  VANCOCIN HCL  Take 1 capsule (125 mg total) by mouth 4 (four) times daily.     Vitamin D 2000 UNITS Caps  Take 1 capsule by mouth daily.           Follow-up Information    Follow up with DAUB, STEVE A, MD. Schedule an appointment as soon as possible for a visit in 2 weeks.   Specialty:  Family Medicine   Why:  Follow up appt after recent hospitalization   Contact information:   [place] [address] [phone]        The results of significant diagnostics from this hospitalization (including imaging, microbiology, ancillary and laboratory) are listed below for reference.    Significant Diagnostic Studies: Dg Chest 2 View  06/12/2014   CLINICAL DATA:  Fever, cough, diarrhea.  EXAM: CHEST  2 VIEW  COMPARISON:  05/18/2014.  FINDINGS: Stable borderline enlarged cardiac silhouette. A small to moderate-sized hiatal hernia is again demonstrated. Interval minimal bibasilar atelectasis with a decreased depth of inspiration. Thoracic spine degenerative changes.  IMPRESSION: 1. Decreased inspiration with minimal bibasilar atelectasis. 2. Stable small to moderate-sized hiatal hernia.   Electronically Signed   By: Enrique Sack M.D.   On: 06/12/2014 22:29   Ct Abdomen Pelvis W Contrast  05/18/2014   CLINICAL DATA:  Diarrhea. Abdominal pain and tenderness. Clinical concern for diverticulitis.  EXAM: CT ABDOMEN AND PELVIS WITH CONTRAST  TECHNIQUE: Multidetector CT imaging of  the abdomen and pelvis was performed using the standard protocol following bolus administration of intravenous contrast.  CONTRAST:  26m OMNIPAQUE IOHEXOL 300 MG/ML SOLN, 1015mOMNIPAQUE IOHEXOL 300 MG/ML SOLN  COMPARISON:  Radiographs obtained earlier today.  FINDINGS: Small liver cysts and area of focal fat deposition. Small bilateral renal cysts. Larger upper pole renal cyst on the left measuring 3.9 cm in maximum diameter. Mild diffuse wall thickening involving the colon, most pronounced involving the rectum and sigmoid colon. Multiple colonic diverticula. Small to moderate-sized hiatal hernia with a paraesophageal component.  Unremarkable spleen, pancreas, gallbladder, adrenal glands, urinary bladder, uterus and ovaries. No enlarged lymph nodes. Lumbar and lower thoracic spine degenerative changes. Clear lung bases.  IMPRESSION: 1. Mild diffuse colitis, most pronounced involving the rectum and sigmoid colon. 2. Small to moderate-sized hiatal hernia with a paraesophageal component. 3. Colonic diverticulosis without evidence of diverticulitis.   Electronically Signed   By: StEnrique Sack.D.   On: 05/18/2014 17:08   Dg Abd Acute W/chest  05/18/2014   CLINICAL DATA:  Diarrhea 4 days.  EXAM: ACUTE ABDOMEN SERIES (ABDOMEN 2 VIEW & CHEST 1 VIEW)  COMPARISON:  Chest x-ray 04/04/2014  FINDINGS: Lungs are adequately inflated and otherwise clear. Cardiomediastinal silhouette is within normal. There is calcified plaque over the aortic arch. Findings suggesting a hiatal hernia without significant change. Remainder of the thorax is unremarkable.  Abdominal images demonstrate urine stool within the colon. There is an air-filled bowel loop over the mid abdomen measuring 8.7 cm likely loop of colon. There are a few nonspecific air-fluid levels over the right lower quadrant. There is no free peritoneal air. There is biphasic curvature of the thoracolumbar spine with degenerative changes throughout the spine. There are  degenerative changes of the hips and right SI joint.  IMPRESSION: Nonspecific air-fluid levels of the right lower quadrant, otherwise nonobstructive bowel gas pattern.  No acute cardiopulmonary disease.  Possible hiatal hernia.   Electronically Signed   By: Marin Olp M.D.   On: 05/18/2014 14:27    Microbiology: Recent Results (from the past 240 hour(s))  Blood culture (routine x 2)     Status: None (Preliminary result)   Collection Time: 06/12/14 10:22 PM  Result Value Ref Range Status   Specimen Description BLOOD RIGHT ANTECUBITAL  Final   Special Requests BOTTLES DRAWN AEROBIC AND ANAEROBIC 5CC  Final   Culture  Setup Time   Final    06/13/2014 03:06 Performed at [facility]    Culture   Final           BLOOD CULTURE RECEIVED NO GROWTH TO DATE CULTURE WILL BE HELD FOR 5 DAYS BEFORE ISSUING A FINAL NEGATIVE REPORT Performed at [facility]    Report Status PENDING  Incomplete  Blood culture (routine x 2)     Status: None (Preliminary result)   Collection Time: 06/12/14 10:22 PM  Result Value Ref Range Status   Specimen Description BLOOD LEFT HAND  Final   Special Requests BOTTLES DRAWN AEROBIC AND ANAEROBIC 3CC  Final   Culture  Setup Time   Final    06/13/2014 03:06 Performed at [facility]    Culture   Final           BLOOD CULTURE RECEIVED NO GROWTH TO DATE CULTURE WILL BE HELD FOR 5 DAYS BEFORE ISSUING A FINAL NEGATIVE REPORT Performed at [facility]    Report Status PENDING  Incomplete  Urine culture     Status: None   Collection Time: 06/13/14 12:16 AM  Result Value Ref Range Status   Specimen Description URINE, CLEAN CATCH  Final   Special Requests NONE  Final   Culture  Setup Time   Final    06/13/2014 03:25 Performed at [facility]   Final    >=100,000 COLONIES/ML Performed at [facility]    Culture   Final    Multiple bacterial morphotypes present, none predominant. Suggest appropriate  recollection if clinically indicated. Performed at [facility]    Report Status 06/14/2014 FINAL  Final  Clostridium Difficile by PCR     Status: Abnormal   Collection Time: 06/13/14 10:00 AM  Result Value Ref Range Status   C difficile by pcr POSITIVE (A) NEGATIVE Final    Comment: CRITICAL RESULT CALLED TO, READ BACK BY AND VERIFIED WITH: BRANNAN,M RN @ [phone] 06/13/14 LEONARD,A Performed at [facility]: Basic Metabolic Panel:  Recent Labs Lab 06/12/14 2221 06/12/14 2222 06/13/14 0410 06/14/14 0500  NA  --  142 141 138  K  --  4.2 3.3* 3.9  CL  --  109 108 108  CO2  --  17* 19 18*  GLUCOSE  --  106* 103* 82  BUN  --  _0 CREATININE  --  0.81 0.73 0.60  CALCIUM  --  8.9 8.9 8.6  MG 1.7  --   --   --    Liver Function Tests:  Recent Labs Lab 06/12/14 2222  AST 13  ALT 6  ALKPHOS 51  BILITOT 0.8  PROT 6.4  ALBUMIN 3.2*   No results for input(s): LIPASE, AMYLASE in the last 168 hours. No results for input(s): AMMONIA in the last 168 hours. CBC:  Recent Labs Lab 06/12/14 2007 06/12/14 2222 06/13/14 0410 06/14/14 0615  WBC 24.2*  21.7* 17.3* 8.6  NEUTROABS  --  19.1*  --   --   HGB 14.7 13.0 11.9* 12.2  HCT 44.5 39.6 37.9 37.9  MCV 91.1 90.6 92.0 92.7  PLT  --  279 248 204   Cardiac Enzymes: No results for input(s): CKTOTAL, CKMB, CKMBINDEX, TROPONINI in the last 168 hours. BNP: BNP (last 3 results) No results for input(s): PROBNP in the last 8760 hours. CBG: No results for input(s): GLUCAP in the last 168 hours.  Time coordinating discharge: Over 30 minutes

## 2014-06-18 DIAGNOSIS — E039 Hypothyroidism, unspecified: Secondary | ICD-10-CM | POA: Diagnosis not present

## 2014-06-18 DIAGNOSIS — F419 Anxiety disorder, unspecified: Secondary | ICD-10-CM | POA: Diagnosis not present

## 2014-06-18 DIAGNOSIS — E785 Hyperlipidemia, unspecified: Secondary | ICD-10-CM | POA: Diagnosis not present

## 2014-06-18 DIAGNOSIS — F329 Major depressive disorder, single episode, unspecified: Secondary | ICD-10-CM | POA: Diagnosis not present

## 2014-06-18 DIAGNOSIS — M6281 Muscle weakness (generalized): Secondary | ICD-10-CM | POA: Diagnosis not present

## 2014-06-18 DIAGNOSIS — I10 Essential (primary) hypertension: Secondary | ICD-10-CM | POA: Diagnosis not present

## 2014-06-18 DIAGNOSIS — A047 Enterocolitis due to Clostridium difficile: Secondary | ICD-10-CM | POA: Diagnosis not present

## 2014-06-19 LAB — CULTURE, BLOOD (ROUTINE X 2)
Culture: NO GROWTH
Culture: NO GROWTH

## 2014-06-19 NOTE — Telephone Encounter (Signed)
Pt needs to see you within two weeks for a hospital f/u, there are no available appts with you until 07/17/14. Pts husband Leonette Mostcharles would like to speak with you regarding this. [facility]# [phone]

## 2014-06-20 DIAGNOSIS — F329 Major depressive disorder, single episode, unspecified: Secondary | ICD-10-CM | POA: Diagnosis not present

## 2014-06-20 DIAGNOSIS — F419 Anxiety disorder, unspecified: Secondary | ICD-10-CM | POA: Diagnosis not present

## 2014-06-20 DIAGNOSIS — E785 Hyperlipidemia, unspecified: Secondary | ICD-10-CM | POA: Diagnosis not present

## 2014-06-20 DIAGNOSIS — M6281 Muscle weakness (generalized): Secondary | ICD-10-CM | POA: Diagnosis not present

## 2014-06-20 DIAGNOSIS — I10 Essential (primary) hypertension: Secondary | ICD-10-CM | POA: Diagnosis not present

## 2014-06-20 DIAGNOSIS — A047 Enterocolitis due to Clostridium difficile: Secondary | ICD-10-CM | POA: Diagnosis not present

## 2014-06-21 DIAGNOSIS — E785 Hyperlipidemia, unspecified: Secondary | ICD-10-CM | POA: Diagnosis not present

## 2014-06-21 DIAGNOSIS — F329 Major depressive disorder, single episode, unspecified: Secondary | ICD-10-CM | POA: Diagnosis not present

## 2014-06-21 DIAGNOSIS — A047 Enterocolitis due to Clostridium difficile: Secondary | ICD-10-CM | POA: Diagnosis not present

## 2014-06-21 DIAGNOSIS — I10 Essential (primary) hypertension: Secondary | ICD-10-CM | POA: Diagnosis not present

## 2014-06-21 DIAGNOSIS — M6281 Muscle weakness (generalized): Secondary | ICD-10-CM | POA: Diagnosis not present

## 2014-06-21 DIAGNOSIS — F419 Anxiety disorder, unspecified: Secondary | ICD-10-CM | POA: Diagnosis not present

## 2014-06-23 DIAGNOSIS — E785 Hyperlipidemia, unspecified: Secondary | ICD-10-CM | POA: Diagnosis not present

## 2014-06-23 DIAGNOSIS — M6281 Muscle weakness (generalized): Secondary | ICD-10-CM | POA: Diagnosis not present

## 2014-06-23 DIAGNOSIS — A047 Enterocolitis due to Clostridium difficile: Secondary | ICD-10-CM | POA: Diagnosis not present

## 2014-06-23 DIAGNOSIS — F419 Anxiety disorder, unspecified: Secondary | ICD-10-CM | POA: Diagnosis not present

## 2014-06-23 DIAGNOSIS — F329 Major depressive disorder, single episode, unspecified: Secondary | ICD-10-CM | POA: Diagnosis not present

## 2014-06-23 DIAGNOSIS — I10 Essential (primary) hypertension: Secondary | ICD-10-CM | POA: Diagnosis not present

## 2014-06-24 DIAGNOSIS — A047 Enterocolitis due to Clostridium difficile: Secondary | ICD-10-CM | POA: Diagnosis not present

## 2014-06-24 DIAGNOSIS — F329 Major depressive disorder, single episode, unspecified: Secondary | ICD-10-CM | POA: Diagnosis not present

## 2014-06-24 DIAGNOSIS — E785 Hyperlipidemia, unspecified: Secondary | ICD-10-CM | POA: Diagnosis not present

## 2014-06-24 DIAGNOSIS — M6281 Muscle weakness (generalized): Secondary | ICD-10-CM | POA: Diagnosis not present

## 2014-06-24 DIAGNOSIS — I10 Essential (primary) hypertension: Secondary | ICD-10-CM | POA: Diagnosis not present

## 2014-06-24 DIAGNOSIS — F419 Anxiety disorder, unspecified: Secondary | ICD-10-CM | POA: Diagnosis not present

## 2014-06-25 ENCOUNTER — Telehealth: Payer: Self-pay | Admitting: *Deleted

## 2014-06-25 NOTE — Telephone Encounter (Signed)
Brianna CanavanFYI Allison from [facility]  Husband has declined home care this week due to the holiday and will allow them to come into the home next week.

## 2014-06-27 DIAGNOSIS — A047 Enterocolitis due to Clostridium difficile: Secondary | ICD-10-CM | POA: Diagnosis not present

## 2014-06-27 DIAGNOSIS — F419 Anxiety disorder, unspecified: Secondary | ICD-10-CM | POA: Diagnosis not present

## 2014-06-27 DIAGNOSIS — E785 Hyperlipidemia, unspecified: Secondary | ICD-10-CM | POA: Diagnosis not present

## 2014-06-27 DIAGNOSIS — M6281 Muscle weakness (generalized): Secondary | ICD-10-CM | POA: Diagnosis not present

## 2014-06-27 DIAGNOSIS — F329 Major depressive disorder, single episode, unspecified: Secondary | ICD-10-CM | POA: Diagnosis not present

## 2014-06-27 DIAGNOSIS — I10 Essential (primary) hypertension: Secondary | ICD-10-CM | POA: Diagnosis not present

## 2014-06-28 ENCOUNTER — Other Ambulatory Visit: Payer: Self-pay | Admitting: Physician Assistant

## 2014-06-30 ENCOUNTER — Telehealth: Payer: Self-pay

## 2014-06-30 DIAGNOSIS — A047 Enterocolitis due to Clostridium difficile: Secondary | ICD-10-CM | POA: Diagnosis not present

## 2014-06-30 DIAGNOSIS — M6281 Muscle weakness (generalized): Secondary | ICD-10-CM | POA: Diagnosis not present

## 2014-06-30 DIAGNOSIS — F329 Major depressive disorder, single episode, unspecified: Secondary | ICD-10-CM | POA: Diagnosis not present

## 2014-06-30 DIAGNOSIS — F419 Anxiety disorder, unspecified: Secondary | ICD-10-CM | POA: Diagnosis not present

## 2014-06-30 DIAGNOSIS — I10 Essential (primary) hypertension: Secondary | ICD-10-CM | POA: Diagnosis not present

## 2014-06-30 DIAGNOSIS — E785 Hyperlipidemia, unspecified: Secondary | ICD-10-CM | POA: Diagnosis not present

## 2014-06-30 NOTE — Telephone Encounter (Signed)
THIS CALL IS FROM Brianna Reynolds - OCC. THERAPIST FROM [facility]: DR. DAUB HAD GIVEN HER AN ORDER TO EVALUATE Brianna Reynolds. SHE CALLED THE [facility] TODAY TO SET UP THE APPOINTMENT. PER MR. CHARLES Hoggard, HIS WIFE DOES NOT NEED OR WANT TO BE EVALUATED. HE DECLINED HER VISIT TO COME. Brianna Reynolds JUST WANTED DR. DAUB TO BE AWARE. [facility]ST PHONE [phone] (Brianna Reynolds - OCC. THERAPIST AT [facility].  MBC

## 2014-07-01 ENCOUNTER — Ambulatory Visit (INDEPENDENT_AMBULATORY_CARE_PROVIDER_SITE_OTHER): Payer: Medicare Other | Admitting: Emergency Medicine

## 2014-07-01 ENCOUNTER — Telehealth: Payer: Self-pay

## 2014-07-01 ENCOUNTER — Encounter: Payer: Self-pay | Admitting: Emergency Medicine

## 2014-07-01 VITALS — BP 102/69 | HR 99 | Temp 98.2°F | Resp 16 | Ht 58.5 in | Wt 131.0 lb

## 2014-07-01 DIAGNOSIS — F411 Generalized anxiety disorder: Secondary | ICD-10-CM

## 2014-07-01 DIAGNOSIS — E785 Hyperlipidemia, unspecified: Secondary | ICD-10-CM | POA: Diagnosis not present

## 2014-07-01 DIAGNOSIS — F419 Anxiety disorder, unspecified: Secondary | ICD-10-CM | POA: Diagnosis not present

## 2014-07-01 DIAGNOSIS — F329 Major depressive disorder, single episode, unspecified: Secondary | ICD-10-CM | POA: Diagnosis not present

## 2014-07-01 DIAGNOSIS — D72829 Elevated white blood cell count, unspecified: Secondary | ICD-10-CM | POA: Diagnosis not present

## 2014-07-01 DIAGNOSIS — M6281 Muscle weakness (generalized): Secondary | ICD-10-CM | POA: Diagnosis not present

## 2014-07-01 DIAGNOSIS — A047 Enterocolitis due to Clostridium difficile: Secondary | ICD-10-CM

## 2014-07-01 DIAGNOSIS — I10 Essential (primary) hypertension: Secondary | ICD-10-CM | POA: Diagnosis not present

## 2014-07-01 DIAGNOSIS — A0472 Enterocolitis due to Clostridium difficile, not specified as recurrent: Secondary | ICD-10-CM

## 2014-07-01 LAB — POCT CBC
Granulocyte percent: 75.9 %G (ref 37–80)
HCT, POC: 48 % — AB (ref 37.7–47.9)
Hemoglobin: 15.4 g/dL (ref 12.2–16.2)
Lymph, poc: 0.7 (ref 0.6–3.4)
MCH, POC: 29.2 pg (ref 27–31.2)
MCHC: 32 g/dL (ref 31.8–35.4)
MCV: 91 fL (ref 80–97)
MID (cbc): 0.7 (ref 0–0.9)
MPV: 8.1 fL (ref 0–99.8)
POC GRANULOCYTE: 8.8 — AB (ref 2–6.9)
POC LYMPH PERCENT: 18.1 %L (ref 10–50)
POC MID %: 6 % (ref 0–12)
Platelet Count, POC: 347 10*3/uL (ref 142–424)
RBC: 5.28 M/uL (ref 4.04–5.48)
RDW, POC: 15.5 %
WBC: 11.6 10*3/uL — AB (ref 4.6–10.2)

## 2014-07-01 MED ORDER — ATORVASTATIN CALCIUM 10 MG PO TABS: ORAL_TABLET | ORAL | Status: DC

## 2014-07-01 MED ORDER — LEVOTHYROXINE SODIUM 50 MCG PO TABS: ORAL_TABLET | ORAL | Status: DC

## 2014-07-01 MED ORDER — VANCOMYCIN HCL 125 MG PO CAPS: 125.0000 mg | ORAL_CAPSULE | Freq: Four times a day (QID) | ORAL | Status: DC

## 2014-07-01 MED ORDER — SERTRALINE HCL 50 MG PO TABS: ORAL_TABLET | ORAL | Status: DC

## 2014-07-01 NOTE — Telephone Encounter (Signed)
I clarified the prescription with [facility]. She will be on 125 4 times a day for 2 weeks. By that time we will figure out what is proper treatment plan for her

## 2014-07-01 NOTE — Progress Notes (Addendum)
Subjective:    Patient ID: Brianna Reynolds, female    DOB: August 18, 1923, 80 y.o.   MRN: [id number] This chart was scribed for Brianna ChrisSteven Brandalynn Ofallon, MD by Brianna Provostobert Reynolds, Medical Scribe. This patient was seen in Room 22 and the patient's care was started at 1:51 PM.  Chief Complaint  Patient presents with  . Follow up after hospitalization    HPI HPI Comments: Brianna Reynolds is a 80 y.o. female with a hx of HTN, hypothyroidism, and dementia-like symptoms who [facility] to [facility] for a follow up after a hospitalization for clostridia difficile. Pt's daughter dictated much of her HPI. Pt is currently on a tapering dose of vancomycin. Pt has completed her flagyl medication.  Pt states she is feeling terrible and has felt that way since she left the hospital. Pt states her body wont stop shaking. Pt denies stress or appetite changes. Pt states she just wants to die right now.     Review of Systems  Constitutional: Negative for fever and chills.       Feeling terrible  Gastrointestinal: Negative for abdominal pain.  Neurological:       Body shaking  Psychiatric/Behavioral: Positive for confusion and agitation.       Objective:   Physical Exam  Constitutional: She is oriented to person, place, and time. She appears well-developed and well-nourished.  Pt voiced that she feel terrible, and would prefer to die.  HENT:  Head: Normocephalic and atraumatic.  Eyes: Pupils are equal, round, and reactive to light.  Neck: Neck supple.  Cardiovascular: Normal rate, regular rhythm and normal heart sounds.   No murmur heard. Pulmonary/Chest: Effort normal and breath sounds normal. No respiratory distress. She has no wheezes.  Abdominal: Soft. Bowel sounds are normal. There is no tenderness.  Musculoskeletal: She exhibits no edema.  Neurological: She is alert and oriented to person, place, and time.  Skin: Skin is warm and dry.  Psychiatric:  Pt is cooperative but continues to voice that she fel terrible  and would prefer to die.  Nursing note and vitals reviewed.  Results for orders placed or performed in visit on 07/01/14  POCT CBC  Result Value Ref Range   WBC 11.6 (A) 4.6 - 10.2 K/uL   Lymph, poc 0.7 0.6 - 3.4   POC LYMPH PERCENT 18.1 10 - 50 %L   MID (cbc) 0.7 0 - 0.9   POC MID % 6.0 0 - 12 %M   POC Granulocyte 8.8 (A) 2 - 6.9   Granulocyte percent 75.9 37 - 80 %G   RBC 5.28 4.04 - 5.48 M/uL   Hemoglobin 15.4 12.2 - 16.2 g/dL   HCT, POC 40.948.0 (A) 81.137.7 - 47.9 %   MCV 91.0 80 - 97 fL   MCH, POC 29.2 27 - 31.2 pg   MCHC 32.0 31.8 - 35.4 g/dL   RDW, POC 91.415.5 %   Platelet Count, POC 347 142 - 424 K/uL   MPV 8.1 0 - 99.8 fL       Assessment & Plan:  Blood pressure is on the low and. White count today is 11,600. When she left the hospital was 8600. She was due to decrease her vancomycin dose tomorrow. I spoke with Dr. Russella DarStark and Dr. Wyonia HoughVan Reynolds. We'll continue vancomycin at 125 mg 4 times a day. Referral made to infectious disease clinic to get their help.I personally performed the services described in this documentation, which was scribed in my presence. The recorded information has been  reviewed and is accurate.Creatinine has risen to 1.6. Will pla on repeat Thursday or Friday. BP meds on hold.

## 2014-07-01 NOTE — Telephone Encounter (Signed)
[facility]. In the ordering comments you had instructions for a taper, but the sig says take 1 tablet 4 times daily. The pharmacy figured you meant to prescribe 1 tablet 4 times daily for two weeks because you put a line through the instructions and then initialed. Is this correct?

## 2014-07-02 ENCOUNTER — Telehealth: Payer: Self-pay

## 2014-07-02 LAB — COMPLETE METABOLIC PANEL WITH GFR
ALBUMIN: 4.1 g/dL (ref 3.5–5.2)
ALT: 10 U/L (ref 0–35)
AST: 19 U/L (ref 0–37)
Alkaline Phosphatase: 67 U/L (ref 39–117)
BUN: 16 mg/dL (ref 6–23)
CO2: 20 mEq/L (ref 19–32)
Calcium: 10.1 mg/dL (ref 8.4–10.5)
Chloride: 101 mEq/L (ref 96–112)
Creat: 1.41 mg/dL — ABNORMAL HIGH (ref 0.50–1.10)
GFR, Est African American: 38 mL/min — ABNORMAL LOW
GFR, Est Non African American: 33 mL/min — ABNORMAL LOW
Glucose, Bld: 100 mg/dL — ABNORMAL HIGH (ref 70–99)
POTASSIUM: 4.6 meq/L (ref 3.5–5.3)
SODIUM: 140 meq/L (ref 135–145)
TOTAL PROTEIN: 6.8 g/dL (ref 6.0–8.3)
Total Bilirubin: 0.5 mg/dL (ref 0.2–1.2)

## 2014-07-02 LAB — SEDIMENTATION RATE: Sed Rate: 15 mm/hr (ref 0–22)

## 2014-07-02 NOTE — Telephone Encounter (Signed)
Dr Cleta Albertsaub, we had OKd 1 RF of amlodipine 5 mg the day before pt came in, but pharm is requesting RF and I see that pt was put on 10 mg in the hospital. Do you want to have her continue the higher dose and send in 1 yr of RFs as you did her other meds? Pended.

## 2014-07-02 NOTE — Telephone Encounter (Signed)
Brianna Reynolds,Brianna Reynolds Daughter [phone]

## 2014-07-02 NOTE — Telephone Encounter (Signed)
Medication that was pending has been cancelled.

## 2014-07-02 NOTE — Telephone Encounter (Signed)
Spoke to Brianna Reynolds- she is aware that pt is not to take BP medications. She was aware of appt Monday with ID, they scheduled the appt earlier today.

## 2014-07-02 NOTE — Telephone Encounter (Signed)
Called and spoke with Karie KirksAileen. Informed her Dr. Cleta Albertsaub has contacted infectious disease and they will be calling her regarding her ongoing problem with c-diff.

## 2014-07-02 NOTE — Telephone Encounter (Signed)
I do not one her on any blood pressure medication at the present time. Her pressures have been running low while she has been sick. The daughter is aware of this and she should not be on blood pressure medication at the present time.

## 2014-07-03 ENCOUNTER — Other Ambulatory Visit: Payer: Medicare Other

## 2014-07-03 ENCOUNTER — Ambulatory Visit (INDEPENDENT_AMBULATORY_CARE_PROVIDER_SITE_OTHER): Payer: Medicare Other | Admitting: Emergency Medicine

## 2014-07-03 VITALS — BP 109/68 | HR 108

## 2014-07-03 DIAGNOSIS — F329 Major depressive disorder, single episode, unspecified: Secondary | ICD-10-CM | POA: Diagnosis not present

## 2014-07-03 DIAGNOSIS — F419 Anxiety disorder, unspecified: Secondary | ICD-10-CM | POA: Diagnosis not present

## 2014-07-03 DIAGNOSIS — A047 Enterocolitis due to Clostridium difficile: Secondary | ICD-10-CM | POA: Diagnosis not present

## 2014-07-03 DIAGNOSIS — I10 Essential (primary) hypertension: Secondary | ICD-10-CM | POA: Diagnosis not present

## 2014-07-03 DIAGNOSIS — E785 Hyperlipidemia, unspecified: Secondary | ICD-10-CM | POA: Diagnosis not present

## 2014-07-03 DIAGNOSIS — M6281 Muscle weakness (generalized): Secondary | ICD-10-CM | POA: Diagnosis not present

## 2014-07-03 DIAGNOSIS — N289 Disorder of kidney and ureter, unspecified: Secondary | ICD-10-CM | POA: Diagnosis not present

## 2014-07-03 LAB — CBC WITH DIFFERENTIAL/PLATELET
BASOS ABS: 0.1 10*3/uL (ref 0.0–0.1)
BASOS PCT: 1 % (ref 0–1)
EOS ABS: 0.2 10*3/uL (ref 0.0–0.7)
EOS PCT: 2 % (ref 0–5)
HCT: 44.5 % (ref 36.0–46.0)
Hemoglobin: 15.3 g/dL — ABNORMAL HIGH (ref 12.0–15.0)
Lymphocytes Relative: 22 % (ref 12–46)
Lymphs Abs: 2 10*3/uL (ref 0.7–4.0)
MCH: 29.8 pg (ref 26.0–34.0)
MCHC: 34.4 g/dL (ref 30.0–36.0)
MCV: 86.7 fL (ref 78.0–100.0)
MPV: 10.6 fL (ref 9.4–12.4)
Monocytes Absolute: 0.7 10*3/uL (ref 0.1–1.0)
Monocytes Relative: 8 % (ref 3–12)
NEUTROS PCT: 67 % (ref 43–77)
Neutro Abs: 6.2 10*3/uL (ref 1.7–7.7)
PLATELETS: 407 10*3/uL — AB (ref 150–400)
RBC: 5.13 MIL/uL — ABNORMAL HIGH (ref 3.87–5.11)
RDW: 14.4 % (ref 11.5–15.5)
WBC: 9.2 10*3/uL (ref 4.0–10.5)

## 2014-07-03 LAB — POCT URINALYSIS DIPSTICK
BILIRUBIN UA: NEGATIVE
Glucose, UA: NEGATIVE
KETONES UA: NEGATIVE
NITRITE UA: NEGATIVE
PH UA: 6
Protein, UA: NEGATIVE
Spec Grav, UA: 1.01
Urobilinogen, UA: 0.2

## 2014-07-03 LAB — BASIC METABOLIC PANEL WITH GFR
BUN: 13 mg/dL (ref 6–23)
CALCIUM: 10.6 mg/dL — AB (ref 8.4–10.5)
CO2: 23 mEq/L (ref 19–32)
CREATININE: 0.71 mg/dL (ref 0.50–1.10)
Chloride: 102 mEq/L (ref 96–112)
GFR, EST AFRICAN AMERICAN: 88 mL/min
GFR, EST NON AFRICAN AMERICAN: 76 mL/min
Glucose, Bld: 123 mg/dL — ABNORMAL HIGH (ref 70–99)
Potassium: 4.4 mEq/L (ref 3.5–5.3)
SODIUM: 139 meq/L (ref 135–145)

## 2014-07-03 NOTE — Progress Notes (Signed)
   Subjective:    Patient ID: Brianna Reynolds, female    DOB: 11-Apr-1924, 80 y.o.   MRN: [id number]  HPI she was requested to come in because of the decline in her kidney function. She is currently on vancomycin 125 4 times a day. She has an appointment scheduled to see Dr. Ilsa IhaSnyder with ID to help with her Clostridium difficile infection.    Review of Systems     Objective:   Physical Exam patient is alert and cooperative and nontoxic-appearing.  Results for orders placed or performed in visit on 07/03/14  POCT urinalysis dipstick  Result Value Ref Range   Color, UA yellow    Clarity, UA cloudy    Glucose, UA neg    Bilirubin, UA jneg    Ketones, UA neg    Spec Grav, UA 1.010    Blood, UA trace-lysed    pH, UA 6.0    Protein, UA neg    Urobilinogen, UA 0.2    Nitrite, UA neg    Leukocytes, UA small (1+)         Assessment & Plan:  Advised to increase fluids.CBC, Bmet, and culture ordered.

## 2014-07-05 LAB — URINE CULTURE: Colony Count: 75000

## 2014-07-07 ENCOUNTER — Ambulatory Visit (INDEPENDENT_AMBULATORY_CARE_PROVIDER_SITE_OTHER): Payer: Medicare Other | Admitting: Internal Medicine

## 2014-07-07 ENCOUNTER — Encounter: Payer: Self-pay | Admitting: Internal Medicine

## 2014-07-07 VITALS — BP 114/71 | HR 106 | Temp 98.5°F | Ht 59.0 in | Wt 130.0 lb

## 2014-07-07 DIAGNOSIS — A047 Enterocolitis due to Clostridium difficile: Secondary | ICD-10-CM

## 2014-07-07 DIAGNOSIS — A0471 Enterocolitis due to Clostridium difficile, recurrent: Secondary | ICD-10-CM

## 2014-07-07 NOTE — Progress Notes (Signed)
Subjective:    Patient ID: Brianna Reynolds, female    DOB: 1924/01/01, 80 y.o.   MRN: [id number]  HPI 80yo F with HTN, hypothyroidism, dementia, who suffers from recurrent c.difficile colitis. Patient reports having been hospitalized on 03/24/14 for fever, altered mental status. Etiology not completely clear. Did have 1 of 2 Bcx positive for CoNS. She was started initially on vancomycin and ceftriaxone. Discharged on ceftin oral antibiotics to possibly treat bacteremia. Repeat blood cx were negative, but likely the patient was on antibiotics on re-draw. She was doing well until mid October when she started to have diarrhea. She was diagnosed with 1st episode of Cdiff. On 10/19 and started on oral flagyl x 14 days and finished on 11/7. She was doing well until 11/12, when she had profuse diarrhea, febrile, with leukocytosis 24.2K, and dehydrated.Found to have cdifficile + again. She was discharged on 11/17 on vanco taper. She was doing well with having 1-3 BM loose stool, not watery. She was seen by her PCP, Dr. Cleta Albertsaub on 12/1 who ran blood work, found to have wbc 11, and elevated cr of 1.4 up for her baseline of 0.8. Repeat bloodwork in 48hr improved, in addition to restarting vanco oral 125mg  QID on 07/02/14.  She was referrred to our clinic for tx of recurrent cdi and possibility of FMT.  She last had colonoscopy done by Dr. Russella DarStark 6535yrs ago. No polyps found on that exam  She generally doesn't take antibiotics on a regular basis or gets hospitalized per family.  Labs: cdiff pcr + 10/19 cdiff pcr + 11/13  cdiff tx: Metronidazole x 2 wk until 11/7 Oral vanco taper plus oral metronidazole 11/17, decrease from qid x 7 d, bid x 7d, daily x 7d   Allergies  Allergen Reactions  . Penicillins Rash     Current Outpatient Prescriptions on File Prior to Visit  Medication Sig Dispense Refill  . acetaminophen (TYLENOL) 325 MG tablet Take 650 mg by mouth every 6 (six) hours as needed for headache  (headache).    Marland Kitchen. aspirin EC 81 MG tablet Take 81 mg by mouth daily.    Marland Kitchen. atorvastatin (LIPITOR) 10 MG tablet One tablet daily 30 tablet 11  . Calcium Carbonate (CALTRATE 600 PO) Take 1 tablet by mouth daily.    . Cholecalciferol (VITAMIN D) 2000 UNITS CAPS Take 1 capsule by mouth daily.     . clonazePAM (KLONOPIN) 0.5 MG tablet Take 1 tablet (0.5 mg total) by mouth 3 (three) times daily as needed for anxiety (anxiety). 30 tablet 0  . levothyroxine (SYNTHROID) 50 MCG tablet One daily on empty stomach same time of day 30 tablet 11  . Melatonin 5 MG TABS Take 5 mg by mouth at bedtime.     . metroNIDAZOLE (FLAGYL) 500 MG tablet Take 1 tablet (500 mg total) by mouth 3 (three) times daily. 27 tablet 0  . Multiple Vitamins-Minerals (PRESERVISION AREDS) CAPS Take 1 capsule by mouth daily.    . QUEtiapine (SEROQUEL) 25 MG tablet Take one tablet at bedtime if unable to sleep after one to 2 hours may take a second dose 60 tablet 11  . sertraline (ZOLOFT) 50 MG tablet Take 1-1/2 tablets daily 45 tablet 11  . vancomycin (VANCOCIN HCL) 125 MG capsule Take 1 capsule (125 mg total) by mouth 4 (four) times daily. 58 capsule 0   No current facility-administered medications on file prior to visit.   Active Ambulatory Problems    Diagnosis Date Noted  .  Abnormal EKG   . HTN (hypertension)   . Fatigue   . Abnormal serum protein electrophoresis 02/11/2013  . Insomnia 02/28/2013  . Hypothyroid   . Recurrent Clostridium difficile diarrhea 06/13/2014  . Leukocytosis 06/13/2014  . Dehydration 06/13/2014  . Blood poisoning   . Cough    Resolved Ambulatory Problems    Diagnosis Date Noted  . Sepsis 03/24/2014  . Acute encephalopathy 03/24/2014  . Lactic acidosis 03/25/2014  . Clostridium difficile diarrhea 06/13/2014   Past Medical History  Diagnosis Date  . Hyperlipidemia   . Cataract   . Anxiety    History   Social History  . Marital Status: Married    Spouse Name: N/A    Number of Children:  N/A  . Years of Education: N/A   Social History Main Topics  . Smoking status: Former Smoker    Quit date: 02/10/1991  . Smokeless tobacco: Never Used  . Alcohol Use: No     Comment: rarely  . Drug Use: No  . Sexual Activity: Not on file   Other Topics Concern  . Not on file   Social History Narrative  family history includes Arthritis in her mother.   Review of Systems + loose stool 1-3 BM per day. No weight loss. Still weak from hospitalization having PT once per week at home    Objective:   Physical Exam BP 114/71 mmHg  Pulse 106  Temp(Src) 98.5 F (36.9 C) (Oral)  Ht 4\' 11"  (1.499 m)  Wt 130 lb (58.968 kg)  BMI 26.24 kg/m2 Physical Exam  Constitutional:  oriented to person, place, and time. appears well-developed and well-nourished. No distress.  HENT:  Mouth/Throat: Oropharynx is clear and moist. No oropharyngeal exudate.  Cardiovascular: Normal rate, regular rhythm and normal heart sounds. Exam reveals no gallop and no friction rub.  No murmur heard.  Pulmonary/Chest: Effort normal and breath sounds normal. No respiratory distress.  has no wheezes.  Abdominal: Soft. Bowel sounds are normal.  exhibits no distension. There is no tenderness.  Neurological: alert and oriented to person, place, and time.  Skin: Skin is warm and dry. No rash noted. No erythema.  Psychiatric: appears engaged, but prompted "what will I be taking" x 4 times despite answering her quesitons     Assessment & Plan:  Recurrent c.difficile colitis = unclear if she had relapse while on vanco taper. We will do a prolonged taper:  vanco 125mg  QID x 14 days (currently day 6) vanco 125mg  TID x 14 days followed by vanco 125mg  BID x 14 days followed by  vanco 125mg  daily x 14 days then vanco 125mg  QOD x 14 days  Will prescribe probiotics, and have asked her to either take pills or yogurt supplement with probiotics Unable to stop taking zoloft, but not currently on any ppi  She could be a  candidate of FMT if taper does not work. i have discussed the process, informed consent and gave information regarding donor selection to the family with reading material.  In the meantime, would like to have her see Dr. Wyvonnia DuskyStark/ Gessner regarding FMT in 6-8 wks. Have appt set up but may need to cancel if patient has resolution of symptoms.  Duke Salviaynthia B. Drue SecondSnider MD MPH [facility] [phone]

## 2014-07-08 ENCOUNTER — Other Ambulatory Visit: Payer: Self-pay | Admitting: Radiology

## 2014-07-08 DIAGNOSIS — I10 Essential (primary) hypertension: Secondary | ICD-10-CM | POA: Diagnosis not present

## 2014-07-08 DIAGNOSIS — A0472 Enterocolitis due to Clostridium difficile, not specified as recurrent: Secondary | ICD-10-CM

## 2014-07-08 DIAGNOSIS — A047 Enterocolitis due to Clostridium difficile: Secondary | ICD-10-CM | POA: Diagnosis not present

## 2014-07-08 DIAGNOSIS — F329 Major depressive disorder, single episode, unspecified: Secondary | ICD-10-CM | POA: Diagnosis not present

## 2014-07-08 DIAGNOSIS — E785 Hyperlipidemia, unspecified: Secondary | ICD-10-CM | POA: Diagnosis not present

## 2014-07-08 DIAGNOSIS — N289 Disorder of kidney and ureter, unspecified: Secondary | ICD-10-CM

## 2014-07-08 DIAGNOSIS — M6281 Muscle weakness (generalized): Secondary | ICD-10-CM | POA: Diagnosis not present

## 2014-07-08 DIAGNOSIS — F419 Anxiety disorder, unspecified: Secondary | ICD-10-CM | POA: Diagnosis not present

## 2014-07-09 DIAGNOSIS — E785 Hyperlipidemia, unspecified: Secondary | ICD-10-CM | POA: Diagnosis not present

## 2014-07-09 DIAGNOSIS — I10 Essential (primary) hypertension: Secondary | ICD-10-CM | POA: Diagnosis not present

## 2014-07-09 DIAGNOSIS — F419 Anxiety disorder, unspecified: Secondary | ICD-10-CM | POA: Diagnosis not present

## 2014-07-09 DIAGNOSIS — M6281 Muscle weakness (generalized): Secondary | ICD-10-CM | POA: Diagnosis not present

## 2014-07-09 DIAGNOSIS — A047 Enterocolitis due to Clostridium difficile: Secondary | ICD-10-CM | POA: Diagnosis not present

## 2014-07-09 DIAGNOSIS — F329 Major depressive disorder, single episode, unspecified: Secondary | ICD-10-CM | POA: Diagnosis not present

## 2014-07-10 DIAGNOSIS — F329 Major depressive disorder, single episode, unspecified: Secondary | ICD-10-CM | POA: Diagnosis not present

## 2014-07-10 DIAGNOSIS — M6281 Muscle weakness (generalized): Secondary | ICD-10-CM | POA: Diagnosis not present

## 2014-07-10 DIAGNOSIS — F419 Anxiety disorder, unspecified: Secondary | ICD-10-CM | POA: Diagnosis not present

## 2014-07-10 DIAGNOSIS — A047 Enterocolitis due to Clostridium difficile: Secondary | ICD-10-CM | POA: Diagnosis not present

## 2014-07-10 DIAGNOSIS — I10 Essential (primary) hypertension: Secondary | ICD-10-CM | POA: Diagnosis not present

## 2014-07-10 DIAGNOSIS — E785 Hyperlipidemia, unspecified: Secondary | ICD-10-CM | POA: Diagnosis not present

## 2014-07-14 ENCOUNTER — Other Ambulatory Visit (INDEPENDENT_AMBULATORY_CARE_PROVIDER_SITE_OTHER): Payer: Medicare Other

## 2014-07-14 DIAGNOSIS — A0472 Enterocolitis due to Clostridium difficile, not specified as recurrent: Secondary | ICD-10-CM

## 2014-07-14 DIAGNOSIS — N289 Disorder of kidney and ureter, unspecified: Secondary | ICD-10-CM

## 2014-07-14 DIAGNOSIS — F329 Major depressive disorder, single episode, unspecified: Secondary | ICD-10-CM | POA: Diagnosis not present

## 2014-07-14 DIAGNOSIS — E785 Hyperlipidemia, unspecified: Secondary | ICD-10-CM | POA: Diagnosis not present

## 2014-07-14 DIAGNOSIS — A047 Enterocolitis due to Clostridium difficile: Secondary | ICD-10-CM | POA: Diagnosis not present

## 2014-07-14 DIAGNOSIS — M6281 Muscle weakness (generalized): Secondary | ICD-10-CM | POA: Diagnosis not present

## 2014-07-14 DIAGNOSIS — F419 Anxiety disorder, unspecified: Secondary | ICD-10-CM | POA: Diagnosis not present

## 2014-07-14 DIAGNOSIS — I10 Essential (primary) hypertension: Secondary | ICD-10-CM | POA: Diagnosis not present

## 2014-07-14 LAB — CBC WITH DIFFERENTIAL/PLATELET
BASOS ABS: 0 10*3/uL (ref 0.0–0.1)
Basophils Relative: 0 % (ref 0–1)
Eosinophils Absolute: 0.1 10*3/uL (ref 0.0–0.7)
Eosinophils Relative: 1 % (ref 0–5)
HCT: 44.1 % (ref 36.0–46.0)
Hemoglobin: 15.2 g/dL — ABNORMAL HIGH (ref 12.0–15.0)
LYMPHS PCT: 18 % (ref 12–46)
Lymphs Abs: 1.8 10*3/uL (ref 0.7–4.0)
MCH: 30.5 pg (ref 26.0–34.0)
MCHC: 34.5 g/dL (ref 30.0–36.0)
MCV: 88.6 fL (ref 78.0–100.0)
MPV: 10.7 fL (ref 9.4–12.4)
Monocytes Absolute: 0.7 10*3/uL (ref 0.1–1.0)
Monocytes Relative: 7 % (ref 3–12)
NEUTROS ABS: 7.4 10*3/uL (ref 1.7–7.7)
Neutrophils Relative %: 74 % (ref 43–77)
PLATELETS: 300 10*3/uL (ref 150–400)
RBC: 4.98 MIL/uL (ref 3.87–5.11)
RDW: 14.3 % (ref 11.5–15.5)
WBC: 10 10*3/uL (ref 4.0–10.5)

## 2014-07-14 LAB — BASIC METABOLIC PANEL WITH GFR
BUN: 14 mg/dL (ref 6–23)
CO2: 25 mEq/L (ref 19–32)
CREATININE: 0.83 mg/dL (ref 0.50–1.10)
Calcium: 10.4 mg/dL (ref 8.4–10.5)
Chloride: 103 mEq/L (ref 96–112)
GFR, EST NON AFRICAN AMERICAN: 63 mL/min
GFR, Est African American: 73 mL/min
Glucose, Bld: 119 mg/dL — ABNORMAL HIGH (ref 70–99)
POTASSIUM: 4.1 meq/L (ref 3.5–5.3)
Sodium: 138 mEq/L (ref 135–145)

## 2014-07-17 DIAGNOSIS — A047 Enterocolitis due to Clostridium difficile: Secondary | ICD-10-CM | POA: Diagnosis not present

## 2014-07-17 DIAGNOSIS — F329 Major depressive disorder, single episode, unspecified: Secondary | ICD-10-CM | POA: Diagnosis not present

## 2014-07-17 DIAGNOSIS — I10 Essential (primary) hypertension: Secondary | ICD-10-CM | POA: Diagnosis not present

## 2014-07-17 DIAGNOSIS — F419 Anxiety disorder, unspecified: Secondary | ICD-10-CM | POA: Diagnosis not present

## 2014-07-17 DIAGNOSIS — E785 Hyperlipidemia, unspecified: Secondary | ICD-10-CM | POA: Diagnosis not present

## 2014-07-17 DIAGNOSIS — M6281 Muscle weakness (generalized): Secondary | ICD-10-CM | POA: Diagnosis not present

## 2014-07-21 DIAGNOSIS — M6281 Muscle weakness (generalized): Secondary | ICD-10-CM | POA: Diagnosis not present

## 2014-07-21 DIAGNOSIS — F419 Anxiety disorder, unspecified: Secondary | ICD-10-CM | POA: Diagnosis not present

## 2014-07-21 DIAGNOSIS — E785 Hyperlipidemia, unspecified: Secondary | ICD-10-CM | POA: Diagnosis not present

## 2014-07-21 DIAGNOSIS — F329 Major depressive disorder, single episode, unspecified: Secondary | ICD-10-CM | POA: Diagnosis not present

## 2014-07-21 DIAGNOSIS — I10 Essential (primary) hypertension: Secondary | ICD-10-CM | POA: Diagnosis not present

## 2014-07-21 DIAGNOSIS — A047 Enterocolitis due to Clostridium difficile: Secondary | ICD-10-CM | POA: Diagnosis not present

## 2014-09-01 ENCOUNTER — Other Ambulatory Visit: Payer: Self-pay | Admitting: Internal Medicine

## 2014-09-01 DIAGNOSIS — A0472 Enterocolitis due to Clostridium difficile, not specified as recurrent: Secondary | ICD-10-CM

## 2014-09-01 MED ORDER — VANCOMYCIN 50 MG/ML ORAL SOLUTION: 125.0000 mg | Freq: Every day | ORAL | Status: DC

## 2014-09-02 ENCOUNTER — Other Ambulatory Visit: Payer: Medicare Other

## 2014-09-02 DIAGNOSIS — A0472 Enterocolitis due to Clostridium difficile, not specified as recurrent: Secondary | ICD-10-CM

## 2014-09-03 ENCOUNTER — Telehealth: Payer: Self-pay | Admitting: *Deleted

## 2014-09-03 LAB — CLOSTRIDIUM DIFFICILE BY PCR: Toxigenic C. Difficile by PCR: DETECTED — CR

## 2014-09-03 NOTE — Telephone Encounter (Signed)
Husband will bring pt to appt 09/11/14 @ 10:30 AM

## 2014-09-03 NOTE — Telephone Encounter (Signed)
Critical Lab value, + C.Diff PCR.  Per Dr. Drue SecondSnider, Add on to 09/11/14 MD schedule.  Will call pt when appt is made.

## 2014-09-05 ENCOUNTER — Telehealth: Payer: Self-pay

## 2014-09-05 NOTE — Telephone Encounter (Signed)
error 

## 2014-09-05 NOTE — Telephone Encounter (Signed)
-----   Message from Iva Booparl E Gessner, MD sent at 09/05/2014  3:04 PM EST ----- OK  We will see about getting her in  I checked with Claudette HeadMalcolm Stark who apparently did prior colonoscopy and he is ok with me seeing her  Hopefully will have a spot to see her in next week before I do hospital week starting 2/15 - would be ideal to do her then   She is old so will have to see about the prep and flex sig vs. colonoscopy  ----- Message -----    From: Judyann Munsonynthia Snider, MD    Sent: 09/04/2014   8:34 AM      To: Iva Booparl E Gessner, MD  Can you evaluate her for FMT. She just had relapsed after prolonged taper, and failed burst. I just restarted her on treatment course with oral vanco.

## 2014-09-05 NOTE — Telephone Encounter (Signed)
I spoke with the patient and her husband.  They declined to schedule an appointment.  They want to see Dr. Drue SecondSnider first.

## 2014-09-06 ENCOUNTER — Telehealth: Payer: Self-pay | Admitting: Internal Medicine

## 2014-09-06 NOTE — Telephone Encounter (Signed)
Started on oral vancomycin treatment doses earlier in the week due to relapse/failure with prolonged taper & pulse dosing. She is no longer having incontinence. She is having 2 semi-formed stools per day. We will see patient on Thursday with discussion on proceeding to FMT.

## 2014-09-08 ENCOUNTER — Telehealth: Payer: Self-pay | Admitting: *Deleted

## 2014-09-08 NOTE — Telephone Encounter (Signed)
Can you give refill oral vanco. She will vanoc 125mg  BID x 7 days , then 125mg  TID x 7days. Then 2nd rx should be 125mg   BID x 2 wk. She needs a long taper

## 2014-09-08 NOTE — Telephone Encounter (Addendum)
Patient's husband measures out and administers patient's medication. He has spilled her supply of oral vancomycin.  Pharmacy is requesting a refill authorization so that the patient can complete her 10 days of oral vancomycin as prescribed - is currently on day 7/10.  She has enough for today's dose.  Please advise.  Pt to see Dr. Drue SecondSnider 2/11 for evaluation. Andree CossHowell, Marsia Cino M, RN

## 2014-09-08 NOTE — Telephone Encounter (Signed)
Clarify Dr Feliz BeamSnider's Note.

## 2014-09-09 NOTE — Telephone Encounter (Signed)
[facility] Dr. Drue SecondSnider spoke with the pharmacy with orders 09/08/14.

## 2014-09-09 NOTE — Telephone Encounter (Signed)
i called pharmacy last night and verbally confirmed my original response of 125mg  qid x 7 days (for a total of 14days) then 1 wk of TID, then 2 wk of BID

## 2014-09-11 ENCOUNTER — Ambulatory Visit (INDEPENDENT_AMBULATORY_CARE_PROVIDER_SITE_OTHER): Payer: Medicare Other | Admitting: Internal Medicine

## 2014-09-11 VITALS — BP 123/70 | HR 98 | Temp 97.8°F | Wt 121.0 lb

## 2014-09-11 DIAGNOSIS — A047 Enterocolitis due to Clostridium difficile: Secondary | ICD-10-CM | POA: Diagnosis not present

## 2014-09-11 DIAGNOSIS — A0471 Enterocolitis due to Clostridium difficile, recurrent: Secondary | ICD-10-CM

## 2014-09-11 NOTE — Progress Notes (Signed)
Subjective:    Patient ID: Brianna Reynolds, female    DOB: 09-Oct-1923, 81 y.o.   MRN: [id number]  HPI  81yo F with HTN, hypothyroidism, dementia, who suffers from recurrent c.difficile colitis.She was initially referred to our clinic by her PCP Dr. Cleta Alberts in early December 2015.  Patient reports having been hospitalized on 03/24/14 for fever, altered mental status. Etiology not completely clear. Did have 1 of 2 Bcx positive for CoNS. She was started initially on vancomycin and ceftriaxone. Discharged on ceftin oral antibiotics to possibly treat bacteremia.  She was doing well until mid October when she started to have diarrhea.  -diagnosed with 1st episode of Cdiff. On 10/19 and started on oral flagyl x 14 days and finished on 11/7. - 1 week after finishing course of metronidazole, she had profuse diarrhea, febrile, with leukocytosis 24.2K, and dehydrated.Found to have cdifficile + again. She was discharged on 11/17 on vanco taper.  - In early decemer 2015, She was doing well with having 1-3 BM loose stool, not watery. She was seen by her PCP, Dr. Cleta Alberts on 12/1 who ran blood work, found to have wbc 11, and elevated cr of 1.4 up for her baseline of 0.8. Who restarted her on vanco oral  QID on 07/02/14 but did not confirm recurrence by testing. We had seen in the in the ID clinic in early December gave her a prolonged vanco taper that declined by a dose per week, and then  Q3 days. In late January, she had take every 3rd day dosing but started to have increasing watery stools, including incontinence. She was restested on Feb 2nd, which was positive again.  - Now on day 9 of 14 vanco  QID. While on treatment still having 2-3 loose, watery stools but no urgency. No weight loss.   prior positive c.diff test: Mid oct 2015 Mid nov 2015 Feb 2nd 2016  meds reviewed.  Active Ambulatory Problems    Diagnosis Date Noted  . Abnormal EKG   . HTN (hypertension)   . Fatigue   . Abnormal  serum protein electrophoresis 02/11/2013  . Insomnia 02/28/2013  . Hypothyroid   . Recurrent Clostridium difficile diarrhea 06/13/2014  . Leukocytosis 06/13/2014  . Dehydration 06/13/2014  . Blood poisoning   . Cough    Resolved Ambulatory Problems    Diagnosis Date Noted  . Sepsis 03/24/2014  . Acute encephalopathy 03/24/2014  . Lactic acidosis 03/25/2014  . Clostridium difficile diarrhea 06/13/2014   Past Medical History  Diagnosis Date  . Hyperlipidemia   . Cataract   . Anxiety      Review of Systems +2-3 looses stools after eating. No abdominal cramping, no blood in stool, no urgency. Good appetite. 10 point ros is otherwise negative    Objective:   Physical Exam BP 123/70 mmHg  Pulse 98  Temp(Src) 97.8 F (36.6 C) (Oral)  Wt 121 lb (54.885 kg) No exam during this visit     Assessment & Plan:  Recurrent c.difficile = elements of being refractory in late Fall 2015. Given it is her 3rd episode, feel that she would be a good candidate for FMT.   25 min discussion face to face regarding treatment of relapse, how the process is done with colonoscopy, how it is considered experimental by FDA but research supports high cure rate. Stool would be procured through [facility]. Gave information from pateint education [facility]. Next steps to meet Dr. Leone Payor for procedure to be done.  Dementia =  she did require at least 3 times of repetition of the information due her dementia. May need for husband to consent as well. She agreed to the procedure,but not sure what she will remember at her next visit with GI.

## 2014-09-12 ENCOUNTER — Telehealth: Payer: Self-pay

## 2014-09-12 NOTE — Telephone Encounter (Signed)
Pt's husband calling for Dr. Cleta Albertsaub. Pt has an appt Scheduled with Dr. Stan Headarl, Gessner at [facility] on 09/25/14, pt will be having Bacteria implanted into her Colon, and Pt's Husband is wantiing to here from Dr. Cleta Albertsaub about possibly having a follow up appt. Scheduled on Tuesday. Please advise

## 2014-09-12 NOTE — Telephone Encounter (Signed)
If you don't have any appts available do you want to fit her in? I called appts and you are booked until the 23rd.

## 2014-09-12 NOTE — Telephone Encounter (Signed)
Okay to work patient in

## 2014-09-12 NOTE — Telephone Encounter (Signed)
error 

## 2014-09-12 NOTE — Telephone Encounter (Signed)
Appointment center please schedule and call pt.

## 2014-09-16 ENCOUNTER — Ambulatory Visit: Payer: Medicare Other | Admitting: Emergency Medicine

## 2014-09-25 ENCOUNTER — Ambulatory Visit (INDEPENDENT_AMBULATORY_CARE_PROVIDER_SITE_OTHER): Payer: Medicare Other | Admitting: Internal Medicine

## 2014-09-25 ENCOUNTER — Encounter: Payer: Self-pay | Admitting: Internal Medicine

## 2014-09-25 DIAGNOSIS — A047 Enterocolitis due to Clostridium difficile: Secondary | ICD-10-CM | POA: Diagnosis not present

## 2014-09-25 DIAGNOSIS — A0471 Enterocolitis due to Clostridium difficile, recurrent: Secondary | ICD-10-CM

## 2014-09-25 MED ORDER — VANCOMYCIN 50 MG/ML ORAL SOLUTION: 125.0000 mg | Freq: Two times a day (BID) | ORAL | Status: DC

## 2014-09-25 NOTE — Progress Notes (Signed)
Referred by Dr. Judyann Munsonynthia Snider Subjective:    Patient ID: Brianna KirksAileen D Reynolds, female    DOB: 20-Nov-1923, 81 y.o.   MRN: [id number] Cc: C diff colitis - consider fecal transplant HPI 81 yo married white woman here with her husband - she has had recurrent C diff colitis over past year. Had fever and altered mental status in 03/2014 - treated with Abx in hospital. Diarrhea in 05/2014 then dx with C diff. Tx with metronidazole x 2 weeks with 1 week of relief then fever and WBC up and + again for C diff and dc 11/17 on vancomycin taper. December 2015 ill again with diarrhea and restarted vancomycin, no testing. Seen by ID and tried taper but could not tolerate vancomycin every 3rd day as she had diarrhea and incontinence. That was January and retested + C diff PCR and on vancomycin 125 qid. She had relief but when on qd vancomycin diarrhea again so back to bid 125 vanco. Soft stools 2-3/day. Not incontinent. Husband provides some hx. They have a family reunion to make in June and wants cure by then. Interested in fecal transplant. "The sooner the better" Allergies  Allergen Reactions  . Penicillins Rash   Outpatient Prescriptions Prior to Visit  Medication Sig Dispense Refill  . acetaminophen (TYLENOL) 325 MG tablet Take 650 mg by mouth every 6 (six) hours as needed for headache (headache).    Marland Kitchen. aspirin EC 81 MG tablet Take 81 mg by mouth daily.    Marland Kitchen. atorvastatin (LIPITOR) 10 MG tablet One tablet daily 30 tablet 11  . Calcium Carbonate (CALTRATE 600 PO) Take 1 tablet by mouth daily.    . Cholecalciferol (VITAMIN D) 2000 UNITS CAPS Take 1 capsule by mouth daily.     . clonazePAM (KLONOPIN) 0.5 MG tablet Take 1 tablet (0.5 mg total) by mouth 3 (three) times daily as needed for anxiety (anxiety). 30 tablet 0  . levothyroxine (SYNTHROID) 50 MCG tablet One daily on empty stomach same time of day 30 tablet 11  . Melatonin 5 MG TABS Take 5 mg by mouth at bedtime.     . Multiple Vitamins-Minerals  (PRESERVISION AREDS) CAPS Take 1 capsule by mouth daily.    . QUEtiapine (SEROQUEL) 25 MG tablet Take one tablet at bedtime if unable to sleep after one to 2 hours may take a second dose 60 tablet 11  . sertraline (ZOLOFT) 50 MG tablet Take 1-1/2 tablets daily 45 tablet 11  . vancomycin (VANCOCIN) 50 mg/mL oral solution Take 2.5 mLs (125 mg total) by mouth daily. 125 mL 0   No facility-administered medications prior to visit.   Past Medical History  Diagnosis Date  . Abnormal EKG   . Fatigue   . Hyperlipidemia   . Hypothyroid   . HTN (hypertension)     New  . Cataract   . Anxiety   . C. difficile colitis   . Pneumonia    Past Surgical History  Procedure Laterality Date  . Thyroidectomy, partial    . Parathyroidectomy    . Breast surgery Bilateral   . Tonsillectomy    . Colonoscopy     History   Social History  . Marital Status: Married    Spouse Name: N/A  . Number of Children: 5  . Years of Education: N/A   Occupational History  . house wife    Social History Main Topics  . Smoking status: Former Smoker    Types: Cigarettes    Quit date: 02/10/1991  .  Smokeless tobacco: Never Used  . Alcohol Use: No     Comment: rarely  . Drug Use: No  . Sexual Activity: Not on file   Other Topics Concern  . None   Social History Narrative   Family History  Problem Relation Age of Onset  . Arthritis Mother      Review of Systems Mild dementia, mild weakness, joint pain, walks slowly but without assist All other ROS negative    Objective:   Physical Exam BP 102/60 mmHg  Pulse 96  Ht 4' 9.75" (1.467 m)  Wt 131 lb 4 oz (59.535 kg)  BMI 27.66 kg/m2 General:  Well-developed, well-nourished and in no acute distress Eyes:  anicteric. Lungs: Clear to auscultation bilaterally. Heart:  S1S2, no rubs, murmurs, gallops. Abdomen:  soft, non-tender, Extremities:   no cyanosis or clubbing Skin   no rash. Neuro:  Mild dementia Psych:  appropriate mood and   Affect.   Data Reviewed: ID notes, PCP notes, labs, imaging in EMR    Assessment & Plan:  Recurrent Clostridium difficile diarrhea Given the failure of remission despite multiple treatments with Vancomycin including tapers she is appropriate for FMT. Have explained risks benefits and indications to husband and patient. IRB consent provided and they are aware this is experimental. Colonoscopy will be attempted given perceived increased efficacy but will not force complete intubation given her age.   Will have to search for a date that works for me and Dr. Drue Second.  Plan [facility] stool donation.     I appreciate the opportunity to care for this patient. ZO:[id number], Stan Head, MD Judyann Munson, MD

## 2014-09-25 NOTE — Assessment & Plan Note (Addendum)
Given the failure of remission despite multiple treatments with Vancomycin including tapers she is appropriate for FMT. Have explained risks benefits and indications to husband and patient. IRB consent provided and they are aware this is experimental. Colonoscopy will be attempted given perceived increased efficacy but will not force complete intubation given her age.   Will have to search for a date that works for me and Dr. Drue SecondSnider.  Plan [facility] stool donation.

## 2014-09-25 NOTE — Patient Instructions (Addendum)
[facility] [address] [place], [place]  [id number] Phone:  [phone]   Fax:  [phone]   Brianna Reynolds   Oct 24, 1923 [id number]   Procedure Date: _____________ Arrival Time: ______________ Procedure Time: ________________ Procedure Location: ___________________________________  The following packet contains information regarding your prep instructions, what to expect on the day of your procedure, follow up care, our cancellation policy, financial information, your rights and responsibilities as a patient, the  complaint/grievance process, advance directives, and a list of frequently asked questions.  _______________________________________________________________________________________  Brianna ColasMIRALAX DOSE INSTRUCTIONS     FOLLOW THESE INSTRUCTIONS ON THE DAY PRIOR TO AND THE MORNING OF THE EXAM.  Purchase a box of Dulcolax (Bisacodyl) 5 mg tablets, 238 gram bottle of Miralax and a 64 ounce bottle of Gatorade (no Red) at your pharmacy  THE DAY BEFORE YOUR PROCEDURE Date___________________ Day: __________________________  Bonita QuinYou may have a light breakfast the morning of prep day (the day before the procedure). You may choose from: eggs, toast, chicken noodle soup, crackers.  You should have your breakfast completed between 8:00 and 9:00 am.  Clear liquids only for the rest of the day on prep day and up until 3 hours before procedure.   In the morning, mix the entire bottle of Miralax with the Gatorade, shake well and refrigerate.   Drink clear liquids the entire day - YOU SHOULD NOT EAT ANY SOLID FOOD.  Do not drink anything colored red or purple. Avoid juices with pulp. No orange juice.  Drink at least 64 oz. (8 glasses) of fluid/clear liquids during the day to prevent dehydration and help the prep work best.  Clear liquids include: water, ice, tea/coffee (sugar is ok, but no milk or cream), juice (apple, white grape, white cranberry), clear bouillon,  consomme, broth, strained chicken noodle soup, jello, popsicles, powdered fruit flavored drinks, gatorade, lemonade, carbonated beverages, hard candy.  At 3:00 pm take 2 (two) Dulcolax (Bisacodyl) tablets with water.   At 5:00 pm complete steps A and B below:      A. Start drinking the Miralax solution, until the liquid is gone.  You will drink one 8 ounce glass every 15 minutes until 1/2 the solution is finished.       At 7:00 pm take 2 (two) Dulcolax (Bisacodyl) tablets with water  After you finish the prep solution, wait another 15 minutes and then drink 16 oz clear liquid of your choice (nothing red or purple). You may drink clear liquids until __________________(which is 3 hours prior to start of your procedure).     OTHER PROCEDURE INSTRUCTIONS  MEDICATIONS    Unless you are otherwise instructed, you should take your regular prescription medications with a small sip of water as early as possibly on the morning of your procedure.    Call office if you have a fever up to 2 days before procedure ([phone])  CARE PARTNER   You will need a responsible adult at least 79 years of age to act as your care partner on the day of your procedure. This person needs to arrive with you to the facility, stay here during your procedure and then drive you home afterwards.  We cannot start your procedure unless your care partner is present in our facility. The total time from sign in until discharge is approximately 2-3 hours.  Before you leave, your doctor will review the findings and recommendation with you (and your care partner, if you give permission).  WHAT TO WEAR/BRING   Wear  loose fitting clothing that is easily removed. Leave jewelry and other valuables at home. However, you may wish to bring a book to read or an iPod/MP3 player to listen to music as you wait for your procedure to start.  Belongings will be given to care partner to keep while you are in the procedure room.  Remove all body  piercing jewelry and leave at home.  You should not wear any red or dark colored fingernail polish.  WHAT TO EXPECT AFTER THE PROCEDURE   Some feelings of bloating in the abdomen.  Passage of more gas than usual.  Walking can help get rid of the air that was put into your GI tract during the procedure and reduce the bloating. You may notice spotting of blood in your stool or on the toilet paper. Since you completed a bowel prep for your procedure you may not have a normal bowel movement for a few days.  DIET   In general, your first meal following the procedure should be a light meal after which it is okay to return to your normal diet.  A half-sandwich or bowl of soup is an example of a good first meal.  Heavy or fried foods are harder to digest and may make you feel nauseous or bloated.  Drink plenty of fluids but you should avoid alcoholic beverages for 24 hours. These diet instructions may be modified depending on the results of your procedure.  ACTIVITY   Your care partner should take you home directly after the procedure.  You should plan to take it easy, moving slowly for the rest of the day.  You can resume normal activity the day after the procedure however you should NOT DRIVE or use heavy machinery for 24 hours (because of the sedation medicines used during the procedure).    coverage.  After hours and on weekends, the on-call physician may be reached by calling [phone].  The answering service will take a message and have the physician on call contact you.    You are to ask questions when you do not understand information or instructions.  If you cannot follow through with your treatment, you must tell your doctor or nurse.  You and your visitors are responsible for being considerate of the needs of other patients, staff and the center.  You are responsible to provide information for insurance purposes and work with our billing staff to arrange for payment when necessary.  Your  health depends on the decisions you make in your daily life.  You are responsible for recognizing the effect of your life style on your health.  You are asked to share your values, beliefs and traditions that help the staff in providing care that respects your values and dignity.      FREQUENTLY ASKED QUESTIONS  WHEN DO I START THE PREP? Please refer to your personalized prep instructions regarding the start time.  We realize, however, that you may work until later than this time. If so, you can begin taking your preparation as soon as you get home.  But realize that the later you start, the later you'll be up going to the bathroom. HOW CAN I IMPROVE THE TASTE OF THE PREP? All the solutions have a salty aftertaste. You can try any one or all of these suggestions to improve or overcome this taste: . Hold hard candy in your mouth while drinking the solution. Almeta Monas each glass with swallows of another beverage (juice, Coke, etc.). Marland Kitchen Suck  on a Popsicle or sucker while drinking the solution. Dorna Bloom flavored gum while drinking the solution. WHAT IF I GET SICK DURING THE PREP?  Stop drinking the solution and wait for 30-45 minutes. Let your system settle down. Try drinking small sips of Coke or other beverage.  Begin the solution again, using some of the suggestions above if the flavor is the problem.  WHEN WILL THE PREP START TO WORK?  Everyone is different in the amount of time that it takes the purgative (laxative) to work. Some people begin to stool in the first hour, others not until the fourth hour or later. Activity is helpful in stimulating the bowel so, if possible, do not sit and wait for the bowel to act - remain active.  DO I HAVE TO DRINK ALL OF THE PREP?  Yes.  In order to give you the best examination possible, it is important to drink all of the solution in the set amount of time.  Some of the preps are "split dose," and so are designed to work best if you drink the prep in two  sittings. Your personalized intructions above will explain that if full detail. In the event that you have tried everything suggested and still cannot complete the preparation, please call us at [phone].  If it is after hours or on the weekend, the answering service will reach the doctor on call for you. WHAT TYPES OF SEDATION ARE USED AT THE [facility]? There are two types of sedation.  Your gastroenterologist will decide which type you will need based on your personal medical issues as well as their own preference.  Moderate Sedation is achieved using a combination of an IV narcotic (fentanyl, Sublimaze) and an IV anxiolytic (midazolam, Versed). This results in a depressed state of consciousness which will allow you to be very relaxed and comfortable during the procedure but still able to respond to stimuli if needed.  You should not remember the procedure and will likely not remember the discharge instructions or discussion with the MD after the test is over.  You cannot drive or operate heavy machinery for 24 hours following the procedure.  You will not receive a separate bill for this type of sedation.  Occasionally this type of sedation is less effective than deep sedation for patients on certain chronic medications (pain medicines, antidepressants, antianxiety medicines) or in patients with a history of chronic alcohol use.   Deep Sedation (Monitored Anesthesia Care) is achieved using a short acting IV anesthetic (diprivan, Propofol) that promotes relaxation and sleep.  This medicine is administered by a CRNA (nurse anesthetist) skilled and credentialed in using diprovan.  You should not remember the procedure itself, but you should be able to remember the discharge instructions and the discussion with your physician afterwards. You cannot drive or operate heavy machinery for 24 hours following the procedure.  You will receive a separate bill for this type of sedation.  This type of sedation is generally  more reliable for patients who take certain chronic medications or with certain medical conditions and so it may be preferred.  If you have any questions about the type of sedation that will be used for your procedure, your gastroenterologist will be happy to discuss it with you. WHY DOES MY CARE PARTNER HAVE TO BE IN THE [facility] DURING MY PROCEDURE? Endoscopic procedures are generally safe, but due to the risk of possible complications associated with the procedure and anesthesia it is our policy that someone be present  during the procedure who will act as your spokesperson should the need arise for emergency intervention.  We cannot start the procedure unless your care partner is present in the facility waiting room. WHY DO I NEED A DRIVER?  The medicines used for your sedation cause delayed reflexes, impair thinking and judgment, and have some amnesic effect, therefore affecting your ability to drive safely. Even though you may feel alright, you are instructed to refrain from driving, operating any type of machinery, making any critical decisions, or signing any legal documents for 24 hours following your procedure.  WHAT SHOULD I BRING WITH ME?  Leave jewelry, purses and wallets at home. Wear loose fitting, easily removed clothing (e.g. no pantyhose or girdles).  You may wish to bring a book to read or an iPod/MP3 player to listen to music as you wait for your procedure to start.  CAN I WEAR DENTURES?  Yes, you may wear your dentures. However, you may be asked to remove them prior to your procedure.  CAN I  WEAR MY CONTACTS?  We advise that you leave your contact lenses at home and wear your glasses instead. If you do wear you lenses, you may be asked to remove them prior to your procedure so please bring a case for them and also a pair of glasses to wear after your procedure.  IS THE TEST SAFE DURING MY MENSTRUAL PERIOD?  Yes, your procedure can still be performed.  WILL THE DOCTOR TALK WITH ME  AFTERWARDS? Yes, your physician will review the findings, follow-up care instructions, and treatment recommendations with you.  This will also be reviewed with your care partner if you have explicitly given [place] permission.  It is important that your care partner realizes you may not remember much after the procedure due to the effects of the anesthesia and so they will need to be able to review this information with you later. HOW LONG WILL I BE THERE? The whole process takes 2-3 hours.  Most of that time is spent checking you in and waking you up safely after the procedure.  The colonoscopy itself takes 20-30 minutes usually.  Obviously, there may be unforseen delays and we will appreciate your patience if that occurs. WHAT IF THE WEATHER IS TERRIBLE?  We will make every effort to contact you if the [facility] is going to close or have a delayed opening due to weather.  If you do not hear from [place], then you can assume the center is open.  If you choose to cancel due to severe weather, you will not be expected to pay the cancellation fee described above.   SIGNATURES/CONFIDENTIALITY  You have signed paperwork which will be entered into your electronic medical record.  This attests to the fact that that the information above on your After Visit Summary has been reviewed and is understood.  Full responsibility of the confidentiality of the printed copy of this After Visit Summary lies with you and/or your care-partner.  We will be in touch with date/time of the fecal matter transplant once Dr. Leone Payor and Dr. Drue Second talk.    I appreciate the opportunity to care for you.

## 2014-09-26 ENCOUNTER — Other Ambulatory Visit: Payer: Self-pay | Admitting: Licensed Clinical Social Worker

## 2014-09-26 MED ORDER — VANCOMYCIN 50 MG/ML ORAL SOLUTION: 125.0000 mg | Freq: Two times a day (BID) | ORAL | Status: DC

## 2014-09-29 ENCOUNTER — Telehealth: Payer: Self-pay

## 2014-09-29 NOTE — Telephone Encounter (Signed)
Patient's husband notified that we will keep in touch one day next week about setting up the colonoscopy

## 2014-09-29 NOTE — Telephone Encounter (Signed)
-----   Message from Iva Booparl E Gessner, MD sent at 09/27/2014  5:44 PM EST ----- Please explain to husband that I will need to find a date for colonoscopy when I return week of 3/7 At this point I think its going to have to be my hospital week later in month but will see if can do prior to that if possible.  Have cced Dr. Drue SecondSnider to keep her in loop

## 2014-10-06 ENCOUNTER — Other Ambulatory Visit: Payer: Self-pay | Admitting: Emergency Medicine

## 2014-10-07 NOTE — Telephone Encounter (Signed)
Faxed

## 2014-10-09 ENCOUNTER — Telehealth: Payer: Self-pay | Admitting: Internal Medicine

## 2014-10-09 ENCOUNTER — Other Ambulatory Visit: Payer: Self-pay

## 2014-10-09 DIAGNOSIS — A0472 Enterocolitis due to Clostridium difficile, not specified as recurrent: Secondary | ICD-10-CM

## 2014-10-09 NOTE — Telephone Encounter (Signed)
Patient is scheduled for [facility]14 [facility]00 [facility].  Her husband is notified of all the instructions.  We went over the new prep instructions.  He verbalized understanding of stopping Vanc on 3/26 and loperamide at 9:00 as well as the prep instructions.  He will call back for any additional questions or concerns

## 2014-10-09 NOTE — Telephone Encounter (Signed)
Dr. Leone PayorGessner have you had a chance to look when we will do the FMT

## 2014-10-09 NOTE — Telephone Encounter (Signed)
Week of Mar 28 (hosiptal week) any day except wed or Thurs  1PM at [facility] ok  Stop vanco 2 days before Take 2 loperamide pills 9 AM

## 2014-10-27 ENCOUNTER — Ambulatory Visit (HOSPITAL_COMMUNITY)
Admission: RE | Admit: 2014-10-27 | Discharge: 2014-10-27 | Disposition: A | Discharge status: Home / Self Care | Payer: Medicare Other | Source: Ambulatory Visit | Attending: Internal Medicine | Admitting: Internal Medicine

## 2014-10-27 ENCOUNTER — Ambulatory Visit (HOSPITAL_COMMUNITY): Payer: Medicare Other | Admitting: Anesthesiology

## 2014-10-27 ENCOUNTER — Encounter (HOSPITAL_COMMUNITY)
Admission: RE | Disposition: A | Discharge status: Home / Self Care | Payer: Self-pay | Source: Ambulatory Visit | Attending: Internal Medicine

## 2014-10-27 ENCOUNTER — Encounter (HOSPITAL_COMMUNITY): Payer: Self-pay | Admitting: *Deleted

## 2014-10-27 DIAGNOSIS — I10 Essential (primary) hypertension: Secondary | ICD-10-CM | POA: Insufficient documentation

## 2014-10-27 DIAGNOSIS — A047 Enterocolitis due to Clostridium difficile: Secondary | ICD-10-CM | POA: Diagnosis not present

## 2014-10-27 DIAGNOSIS — E890 Postprocedural hypothyroidism: Secondary | ICD-10-CM | POA: Insufficient documentation

## 2014-10-27 DIAGNOSIS — F0390 Unspecified dementia without behavioral disturbance: Secondary | ICD-10-CM | POA: Insufficient documentation

## 2014-10-27 DIAGNOSIS — K5730 Diverticulosis of large intestine without perforation or abscess without bleeding: Secondary | ICD-10-CM | POA: Diagnosis not present

## 2014-10-27 DIAGNOSIS — F419 Anxiety disorder, unspecified: Secondary | ICD-10-CM | POA: Insufficient documentation

## 2014-10-27 DIAGNOSIS — Z7982 Long term (current) use of aspirin: Secondary | ICD-10-CM | POA: Insufficient documentation

## 2014-10-27 DIAGNOSIS — Z87891 Personal history of nicotine dependence: Secondary | ICD-10-CM | POA: Diagnosis not present

## 2014-10-27 DIAGNOSIS — Z792 Long term (current) use of antibiotics: Secondary | ICD-10-CM | POA: Insufficient documentation

## 2014-10-27 DIAGNOSIS — Z79899 Other long term (current) drug therapy: Secondary | ICD-10-CM | POA: Diagnosis not present

## 2014-10-27 DIAGNOSIS — A0472 Enterocolitis due to Clostridium difficile, not specified as recurrent: Secondary | ICD-10-CM | POA: Insufficient documentation

## 2014-10-27 DIAGNOSIS — E785 Hyperlipidemia, unspecified: Secondary | ICD-10-CM | POA: Insufficient documentation

## 2014-10-27 HISTORY — PX: FECAL TRANSPLANT: SHX6383

## 2014-10-27 HISTORY — PX: COLONOSCOPY: SHX5424

## 2014-10-27 SURGERY — COLONOSCOPY
Anesthesia: Monitor Anesthesia Care

## 2014-10-27 MED ORDER — LACTATED RINGERS IV SOLN
INTRAVENOUS | Status: DC
  Administered 2014-10-27: 1000 mL via INTRAVENOUS

## 2014-10-27 MED ORDER — PROPOFOL 10 MG/ML IV BOLUS
INTRAVENOUS | Status: DC | PRN
  Administered 2014-10-27: 10 mg via INTRAVENOUS
  Administered 2014-10-27: 20 mg via INTRAVENOUS
  Administered 2014-10-27: 10 mg via INTRAVENOUS

## 2014-10-27 MED ORDER — LACTATED RINGERS IV SOLN
INTRAVENOUS | Status: DC | PRN
  Administered 2014-10-27: 13:00:00 via INTRAVENOUS

## 2014-10-27 MED ORDER — FENTANYL CITRATE 0.05 MG/ML IJ SOLN
INTRAMUSCULAR | Status: DC | PRN
  Administered 2014-10-27: 50 ug via INTRAVENOUS

## 2014-10-27 MED ORDER — ONDANSETRON HCL 4 MG/2ML IJ SOLN
INTRAMUSCULAR | Status: DC | PRN
  Administered 2014-10-27: 4 mg via INTRAVENOUS

## 2014-10-27 MED ORDER — SODIUM CHLORIDE 0.9 % IV SOLN: INTRAVENOUS | Status: DC

## 2014-10-27 MED ORDER — LIDOCAINE HCL (CARDIAC) 20 MG/ML IV SOLN
INTRAVENOUS | Status: DC | PRN
  Administered 2014-10-27: 50 mg via INTRAVENOUS

## 2014-10-27 SURGICAL SUPPLY — 1 items: PREP MICROBIOTA FECAL (Tissue) ×3 IMPLANT

## 2014-10-27 NOTE — H&P (Signed)
[facility] History and Physical   Primary Care Physician:  Lucilla Edin, MD   Reason for Procedure:   fecal microbiotica transplant   Plan:    Colonoscopy with fecal microbiotica transplant      HPI: Brianna Reynolds is a 81 y.o. female with recurrent C diff colitis. She is here for colonoscopy-administered fecal microbiotica transplant.    Past Medical History  Diagnosis Date  . Abnormal EKG   . Fatigue   . Hyperlipidemia   . Hypothyroid   . HTN (hypertension)     New  . Cataract   . Anxiety   . C. difficile colitis   . Pneumonia     Past Surgical History  Procedure Laterality Date  . Thyroidectomy, partial    . Parathyroidectomy    . Breast surgery Bilateral   . Tonsillectomy    . Colonoscopy      Prior to Admission medications   Medication Sig Start Date End Date Taking? Authorizing Provider  aspirin EC 81 MG tablet Take 81 mg by mouth daily.   Yes Historical Provider, MD  atorvastatin (LIPITOR) 10 MG tablet One tablet daily 07/01/14  Yes Collene Gobble, MD  Calcium Carbonate (CALTRATE 600 PO) Take 1 tablet by mouth daily.   Yes Historical Provider, MD  Cholecalciferol (VITAMIN D) 2000 UNITS CAPS Take 1 capsule by mouth daily.    Yes Historical Provider, MD  clonazePAM (KLONOPIN) 0.5 MG tablet TAKE 1 TABLET THREE TIMES DAILY AS NEEDED FOR ANXIETY. 10/06/14  Yes Collene Gobble, MD  levothyroxine (SYNTHROID) 50 MCG tablet One daily on empty stomach same time of day 07/01/14  Yes Collene Gobble, MD  Melatonin 5 MG TABS Take 5 mg by mouth at bedtime.    Yes Historical Provider, MD  Multiple Vitamins-Minerals (PRESERVISION AREDS) CAPS Take 1 capsule by mouth daily.   Yes Historical Provider, MD  QUEtiapine (SEROQUEL) 25 MG tablet Take one tablet at bedtime if unable to sleep after one to 2 hours may take a second dose 05/13/14  Yes Collene Gobble, MD  sertraline (ZOLOFT) 50 MG tablet Take 1-1/2 tablets daily 07/01/14  Yes Collene Gobble, MD  vancomycin  (VANCOCIN) 50 mg/mL oral solution Take 2.5 mLs (125 mg total) by mouth 2 (two) times daily. 09/26/14  Yes Judyann Munson, MD  acetaminophen (TYLENOL) 325 MG tablet Take 650 mg by mouth every 6 (six) hours as needed for headache (headache).    Historical Provider, MD    Current Facility-Administered Medications  Medication Dose Route Frequency Provider Last Rate Last Dose  . 0.9 %  sodium chloride infusion   Intravenous Continuous Iva Boop, MD      . lactated ringers infusion   Intravenous Continuous Iva Boop, MD 10 mL/hr at 10/27/14 1228 1,000 mL at 10/27/14 1228    Allergies as of 10/09/2014 - Review Complete 09/25/2014  Allergen Reaction Noted  . Penicillins Rash 02/10/2011    Family History  Problem Relation Age of Onset  . Arthritis Mother     History   Social History  . Marital Status: Married    Spouse Name: N/A  . Number of Children: 5  . Years of Education: N/A   Occupational History  . house wife    Social History Main Topics  . Smoking status: Former Smoker    Types: Cigarettes    Quit date: 02/10/1991  . Smokeless tobacco: Never Used  . Alcohol Use: No     Comment:  rarely  . Drug Use: No  . Sexual Activity: Not on file   Other Topics Concern  . Not on file   Social History Narrative    Review of Systems: Positive for mild dementia All other review of systems negative except as mentioned in the HPI.  Physical Exam: Vital signs in last 24 hours: Temp:  [98.1 F (36.7 C)] 98.1 F (36.7 C) (03/28 1210) Resp:  [18] 18 (03/28 1210) BP: (103)/(48) 103/48 mmHg (03/28 1210) SpO2:  [98 %] 98 % (03/28 1210) Weight:  [137 lb (62.143 kg)] 137 lb (62.143 kg) (03/28 1210)   General:   Alert,  Well-developed, well-nourished, pleasant and cooperative in NAD Lungs:  Clear throughout to auscultation.   Heart:  Regular rate and rhythm; no murmurs, clicks, rubs,  or gallops. Abdomen:  Soft, nontender and nondistended. Normal bowel sounds.    Neuro/Psych:  Alert and cooperative. Normal mood and affect. A and O x 3   @Darron Stuck  Sena SlateE. Maelle Sheaffer, MD, [facility] [phone] (pager) 10/27/2014 1:06 PM@

## 2014-10-27 NOTE — Anesthesia Preprocedure Evaluation (Addendum)
Anesthesia Evaluation  Patient identified by MRN, date of birth, ID band Patient awake    Reviewed: Allergy & Precautions, NPO status , Patient's Chart, lab work & pertinent test results  Airway Mallampati: II  TM Distance: >3 FB Neck ROM: Full    Dental no notable dental hx.    Pulmonary former smoker,  breath sounds clear to auscultation  Pulmonary exam normal       Cardiovascular hypertension, Pt. on medications Rhythm:Regular Rate:Normal     Neuro/Psych negative neurological ROS  negative psych ROS   GI/Hepatic negative GI ROS, Neg liver ROS,   Endo/Other  Hypothyroidism   Renal/GU negative Renal ROS  negative genitourinary   Musculoskeletal negative musculoskeletal ROS (+)   Abdominal   Peds negative pediatric ROS (+)  Hematology negative hematology ROS (+)   Anesthesia Other Findings   Reproductive/Obstetrics negative OB ROS                            Anesthesia Physical Anesthesia Plan  ASA: II  Anesthesia Plan: MAC   Post-op Pain Management:    Induction: Intravenous  Airway Management Planned: Nasal Cannula and Simple Face Mask  Additional Equipment:   Intra-op Plan:   Post-operative Plan:   Informed Consent: I have reviewed the patients History and Physical, chart, labs and discussed the procedure including the risks, benefits and alternatives for the proposed anesthesia with the patient or authorized representative who has indicated his/her understanding and acceptance.   Dental advisory given  Plan Discussed with: CRNA and Surgeon  Anesthesia Plan Comments:         Anesthesia Quick Evaluation

## 2014-10-27 NOTE — Transfer of Care (Signed)
Immediate Anesthesia Transfer of Care Note  Patient: Brianna Reynolds  Procedure(s) Performed: Procedure(s): COLONOSCOPY (N/A)  Patient Location: PACU and Endoscopy Unit  Anesthesia Type:MAC  Level of Consciousness: awake, alert , oriented and patient cooperative  Airway & Oxygen Therapy: Patient Spontanous Breathing and Patient connected to nasal cannula oxygen  Post-op Assessment: Report given to RN, Post -op Vital signs reviewed and stable and Patient moving all extremities  Post vital signs: Reviewed and stable  Last Vitals:  Filed Vitals:   10/27/14 1210  BP: 103/48  Temp: 36.7 C  Resp: 18    Complications: No apparent anesthesia complications

## 2014-10-27 NOTE — Discharge Instructions (Signed)
The fecal transplant went as planned. I saw diverticulosis but no other problems - and diverticulosis is not usually a problem for people.  Please take 2 Imodium AD (loperamide) when you get home.  If the diarrhea returns please let Dr. Drue Second or me know. It may be typical for you to have softer stools than you used to but should not have bad diarrhea like you have had with the C diff in past.  I appreciate the opportunity to care for you. Iva Boop, MD, FACG   YOU HAD AN ENDOSCOPIC PROCEDURE TODAY: Refer to the procedure report and other information in the discharge instructions given to you for any specific questions about what was found during the examination. If this information does not answer your questions, please call Dr. Marvell Fuller office at [phone] to clarify.   YOU SHOULD EXPECT: Some feelings of bloating in the abdomen. Passage of more gas than usual. Walking can help get rid of the air that was put into your GI tract during the procedure and reduce the bloating. If you had a lower endoscopy (such as a colonoscopy or flexible sigmoidoscopy) you may notice spotting of blood in your stool or on the toilet paper. Some abdominal soreness may be present for a day or two, also.  DIET: Your first meal following the procedure should be a light meal and then it is ok to progress to your normal diet. A half-sandwich or bowl of soup is an example of a good first meal. Heavy or fried foods are harder to digest and may make you feel nauseous or bloated. Drink plenty of fluids but you should avoid alcoholic beverages for 24 hours.   ACTIVITY: Your care partner should take you home directly after the procedure. You should plan to take it easy, moving slowly for the rest of the day. You can resume normal activity the day after the procedure however YOU SHOULD NOT DRIVE, use power tools, machinery or perform tasks that involve climbing or major physical exertion for 24 hours (because  of the sedation medicines used during the test).   SYMPTOMS TO REPORT IMMEDIATELY: A gastroenterologist can be reached at any hour. Please call [phone]  for any of the following symptoms:  Following lower endoscopy (colonoscopy, flexible sigmoidoscopy) Excessive amounts of blood in the stool  Significant tenderness, worsening of abdominal pains  Swelling of the abdomen that is new, acute  Fever of 100 or higher  Black, tarry-looking or red, bloody stools     Clostridium Difficile Infection Clostridium difficile (C. difficile) is a germ found in the intestines. C. difficile infection can occur after taking some medicines. C. difficile infection can cause watery poop (diarrhea) or severe disease. HOME CARE  Drink enough fluids to keep your pee (urine) clear or pale yellow. Avoid milk, caffeine, and alcohol.  Ask your doctor how to replace body fluid losses (rehydrate).  Eat small meals more often rather than large meals.  Take your medicine (antibiotics) as told. Finish it even if you start to feel better.  Do not  use medicines to slow the watery poop.  Wash your hands well after using the bathroom and before preparing food.  Make sure people who live with you wash their hands often.  Clean all surfaces. Use a product that contains chlorine bleach. GET HELP RIGHT AWAY IF:   The watery poop does not stop, or it comes back after you finish your medicine.  You feel very dry or thirsty (dehydrated).  You  have a fever.  You have more belly (abdominal) pain or tenderness.  There is blood in your poop (stool), or your poop is black and tar-like.  You cannot eat food or drink liquids without throwing up (vomiting). MAKE SURE YOU:  Understand these instructions.  Will watch your condition.  Will get help right away if you are not doing well or get worse. Document Released: 05/15/2009 Document Revised: 12/02/2013 Document Reviewed: 12/24/2010 [facility] Patient  Information 2015 [place], [place]. This information is not intended to replace advice given to you by your health care provider. Make sure you discuss any questions you have with your health care provider.

## 2014-10-27 NOTE — Op Note (Signed)
[facility] [address] [place], [id number]   COLONOSCOPY PROCEDURE REPORT  PATIENT: Brianna Reynolds, Brianna Reynolds  MR#: [id number] BIRTHDATE: Dec 09, 1923 , 89  yrs. old GENDER: female ENDOSCOPIST: Iva Booparl E Richar Dunklee, MD, [facility] PROCEDURE DATE:  10/27/2014 PROCEDURE:   Colonoscopy, diagnostic First Screening Colonoscopy - Avg.  risk and is 50 yrs.  old or older - No.  Prior Negative Screening - Now for repeat screening. N/A  History of Adenoma - Now for follow-up colonoscopy & has been > or = to 3 yrs.  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 MEDICATIONS: Monitored anesthesia care and Per Anesthesia  DESCRIPTION OF PROCEDURE:   After the risks benefits and alternatives of the procedure were thoroughly explained, informed consent was obtained.  The digital rectal exam revealed no abnormalities of the rectum.   The Pentax Ped Colon [id number] endoscope was introduced through the anus and advanced to the cecum, which was identified by both the appendix and ileocecal valve. No adverse events experienced.   The quality of the prep was good.  (MiraLax was used)  The instrument was then quickly withdrawn without focus on exam given theraputic intentions..      COLON FINDINGS: There was severe diverticulosis noted in the left colon.   The examination was otherwise normal  - limited exam. After reaching the cecum 250 cc Open Biome donor stool was infused into the right colon. Retroflexed views revealed no abnormalities. The time to cecum = 7.1 Withdrawal time = 2.7   The scope was withdrawn and the procedure completed. COMPLICATIONS: There were no immediate complications.  ENDOSCOPIC IMPRESSION: 1.   Severe diverticulosis was noted in the left colon 2.   The examination was otherwise normal - fecal microbiotica transplant  performed - exam not detailed given this procedure and quick withdrawal of colonoscope.  RECOMMENDATIONS: Standard post fecal transplant guidelines - loperamide 4 mg when home. Observe for recurrent diarrhea.  eSigned:  Iva Booparl E Mannie Ohlin, MD, [facility] 10/27/2014 2:04 PM   cc: Earl LitesSteve Daub, MD

## 2014-10-28 ENCOUNTER — Encounter (HOSPITAL_COMMUNITY): Payer: Self-pay | Admitting: Internal Medicine

## 2014-10-28 NOTE — Anesthesia Postprocedure Evaluation (Signed)
  Anesthesia Post-op Note  Patient: Brianna Reynolds  Procedure(s) Performed: Procedure(s) (LRB): COLONOSCOPY (N/A)  Patient Location: PACU  Anesthesia Type: MAC  Level of Consciousness: awake and alert   Airway and Oxygen Therapy: Patient Spontanous Breathing  Post-op Pain: mild  Post-op Assessment: Post-op Vital signs reviewed, Patient's Cardiovascular Status Stable, Respiratory Function Stable, Patent Airway and No signs of Nausea or vomiting  Last Vitals:  Filed Vitals:   10/27/14 1405  BP: 140/43  Pulse: 63  Temp:   Resp: 14    Post-op Vital Signs: stable   Complications: No apparent anesthesia complications

## 2014-12-10 ENCOUNTER — Ambulatory Visit (INDEPENDENT_AMBULATORY_CARE_PROVIDER_SITE_OTHER): Payer: Medicare Other | Admitting: Family Medicine

## 2014-12-10 VITALS — BP 120/60 | HR 94 | Temp 97.9°F | Resp 18 | Ht 58.5 in | Wt 129.6 lb

## 2014-12-10 DIAGNOSIS — H539 Unspecified visual disturbance: Secondary | ICD-10-CM

## 2014-12-10 DIAGNOSIS — R51 Headache: Secondary | ICD-10-CM | POA: Diagnosis not present

## 2014-12-10 DIAGNOSIS — R519 Headache, unspecified: Secondary | ICD-10-CM

## 2014-12-10 NOTE — Progress Notes (Signed)
Brianna Reynolds - 81 y.o. female MRN [id number]  Date of birth: [birth year]  SUBJECTIVE:  Including CC & ROS.  She is a pleasant 81 year old female past medical history significant for hyperlipidemia, hypothyroidism, previous hypertension currently not on medications, history of C. difficile infection this winter 2016 finally resolved after fecal transplant in March 2016. History of thyroid and parathyroidectomy, history of anxiety and insomnia.  Patient presented today with acute onset of visual symptoms. Patient's husband provided majority of her history today. He reports that they at a local doctor's office for an appointment he had when he got back to the car where she was sitting she was complaining of blurry vision. She was describing the sensation that a shower curtain was over her eyes. She was afraid to drive because this visual change. They drove home which was close to the house. When she arrived home she developed some anxious symptoms of shaking and continued blurred vision. The blurred vision lasting approximately 15 minutes. Following this she developed a headache typically behind her right eye. She reports her visual symptoms have completely resolved. She was treated with some Klonopin this afternoon which resolved her shaking feeling anxious. She continues have a headache which she rates between a 5 to 10/10. She denies that it's a worse headache. She is a history of migraines but she has not had one several years. Her headaches usually resolve with Tylenol. Daughter at bedside also reports a history of visual floaters in the past. Husband reports that she has no chronic eye abnormalities but is due for a repeat eye doctor exam with their ophthalmologist. Patient's husband denies any symptoms of confusion at the house or now. She does have some underlying dementia which is unchanged. Patient denies any difficulty speaking or finding her words, family denies any slurred speech, no dizziness,  no lightheadedness, no difficulty swallowing, no upper or lower extremity weakness, or balance or gait dysfunction, denies any shortness of breath, chest pain or dyspnea on exertion. Reports a distant history of TIA in the past is not classified with any imaging. Sometimes at that time and diagnosed with a TIA which was over 30 years ago was feeling "weird".   ROS:  Constitutional:  No fever, chills, or fatigue.  Respiratory:  No shortness of breath, cough, or wheezing Cardiovascular:  No palpitations, chest pain or syncope Gastrointestinal:  No nausea, no abdominal pain Review of systems otherwise negative except for what is stated in HPI  HISTORY: Past Medical, Surgical, Social, and Family History Reviewed & Updated per EMR. Pertinent Historical Findings include: medical history significant for hyperlipidemia, hypothyroidism, previous hypertension currently not on medications, history of C. difficile infection this winter 2016 finally resolved after fecal transplant in March 2016. History of thyroid and parathyroidectomy, history of anxiety and insomnia.   PHYSICAL EXAM:  VS: BP:120/60 mmHg  HR:94bpm  TEMP:97.9 F (36.6 C)(Oral)  RESP:94 %  HT:4' 10.5" (148.6 cm)   WT:129 lb 9.6 oz (58.786 kg)  BMI:26.7 PHYSICAL EXAM: General:  Alert and oriented, No acute distress.   HENT:  Normocephalic, Oral mucosa is moist.  Funduscopic exam revealed a mild darkening possible retinal tear of the right eye on exam today, normal left eye with no changes. Respiratory:  Lungs are clear to auscultation, Respirations are non-labored, Symmetrical chest wall expansion.   Cardiovascular:  Normal rate, Regular rhythm, No murmur, Good pulses equal in all extremities, No edema.   Gastrointestinal:  Soft, Non-tender, Non-distended, Normal bowel sounds, No organomegaly.   Integumentary:  Warm, Dry, No rash.   Neurologic:  Alert, Oriented, No focal defects. Cranial nerves II through XII intact. Intact motor and  sensory function in bilateral upper and lower extremities. Normal visual exam with visual tracking, normal finger to nose. Normal heel walking and toe walking was no balance issues or gait abnormalities. Psychiatric:  Cooperative, Appropriate mood & affect.    ASSESSMENT & PLAN:  Impression: Possible retinal tear Possible visual floaters that have resolved Secondary panic attack to visual changes Possible TIA or stroke  Recommendations: -Discuss in length with patient husband and daughter at bedside based on her symptoms I suspect she had a visual abnormality possibly from a small retinal tear were visual floater in the eye that triggered a panic attack that started her shaking. Secondary headache could be related to eye abnormalities. -We discussed that there still possibility for stroke given her age and distant history of TIA in the past. We're to further classify this we would recommend proceeding with a CT. Family would desires not to go to the emergency room this evening or even have her CT done tonight. -We'll schedule CT for tomorrow morning without contrast to rule out cerebral bleeding. -Also recommended make an appointment with ophthalmology for tomorrow to have a slightly exam and further classify any retinal pathology. -Patient may take Tylenol for her headache tonight but should avoid any blood thinners NSAIDs. -Family was comfortable with this plan and was agreeable with suggested workup. Advised them to follow up sooner if she develops any further neurologic deficits.

## 2014-12-11 ENCOUNTER — Telehealth: Payer: Self-pay | Admitting: Sports Medicine

## 2014-12-11 ENCOUNTER — Encounter (HOSPITAL_COMMUNITY): Payer: Self-pay

## 2014-12-11 ENCOUNTER — Ambulatory Visit (HOSPITAL_COMMUNITY)
Admission: RE | Admit: 2014-12-11 | Discharge: 2014-12-11 | Disposition: A | Discharge status: Home / Self Care | Payer: Medicare Other | Source: Ambulatory Visit | Attending: Sports Medicine | Admitting: Sports Medicine

## 2014-12-11 DIAGNOSIS — E785 Hyperlipidemia, unspecified: Secondary | ICD-10-CM | POA: Insufficient documentation

## 2014-12-11 DIAGNOSIS — R51 Headache: Secondary | ICD-10-CM | POA: Diagnosis not present

## 2014-12-11 DIAGNOSIS — H04123 Dry eye syndrome of bilateral lacrimal glands: Secondary | ICD-10-CM | POA: Diagnosis not present

## 2014-12-11 DIAGNOSIS — H53129 Transient visual loss, unspecified eye: Secondary | ICD-10-CM | POA: Diagnosis not present

## 2014-12-11 DIAGNOSIS — Z961 Presence of intraocular lens: Secondary | ICD-10-CM | POA: Diagnosis not present

## 2014-12-11 DIAGNOSIS — H539 Unspecified visual disturbance: Secondary | ICD-10-CM

## 2014-12-11 DIAGNOSIS — I10 Essential (primary) hypertension: Secondary | ICD-10-CM | POA: Diagnosis not present

## 2014-12-11 DIAGNOSIS — H3531 Nonexudative age-related macular degeneration: Secondary | ICD-10-CM | POA: Diagnosis not present

## 2014-12-11 DIAGNOSIS — R519 Headache, unspecified: Secondary | ICD-10-CM

## 2014-12-11 DIAGNOSIS — H02834 Dermatochalasis of left upper eyelid: Secondary | ICD-10-CM | POA: Diagnosis not present

## 2014-12-11 DIAGNOSIS — H02831 Dermatochalasis of right upper eyelid: Secondary | ICD-10-CM | POA: Diagnosis not present

## 2014-12-11 NOTE — Telephone Encounter (Signed)
Spoke with patient's husband over the phone to notify him the CT results were negative with no signs of acute stroke or hemorrhage.  There was evidence of a possible small thrombus in the left MCA. Recommended patient have a MRA head and neck done at some point to assess possible vascular compromise.  -For this note to Dr. Cleta Albertsaub patient's PCP for him to discuss with the family if they would like to proceed with MRI.

## 2014-12-11 NOTE — Telephone Encounter (Signed)
Spoke with pt's husband. They will come in Sunday.

## 2014-12-11 NOTE — Telephone Encounter (Signed)
Call the patient's husband. I can see her at the walk-in clinic Sunday and we can fast tracker. That would probably be the best. She can also have an appointment next Thursday if that would be better around 12:30.

## 2014-12-12 NOTE — Telephone Encounter (Signed)
That will be great. Be sure they also have an appointment to follow-up with her neurologist.

## 2014-12-12 NOTE — Telephone Encounter (Signed)
Spoke with pt's husband and informed message from Dr. Cleta Albertsaub. Pt states she does not have a urologist and needs a referral. This can be done on Sunday when he brings her in.

## 2014-12-14 ENCOUNTER — Ambulatory Visit (INDEPENDENT_AMBULATORY_CARE_PROVIDER_SITE_OTHER): Payer: Medicare Other | Admitting: Emergency Medicine

## 2014-12-14 VITALS — BP 106/56 | HR 85 | Temp 97.5°F | Resp 16 | Ht 58.5 in | Wt 130.2 lb

## 2014-12-14 DIAGNOSIS — A0472 Enterocolitis due to Clostridium difficile, not specified as recurrent: Secondary | ICD-10-CM

## 2014-12-14 DIAGNOSIS — R930 Abnormal findings on diagnostic imaging of skull and head, not elsewhere classified: Secondary | ICD-10-CM

## 2014-12-14 DIAGNOSIS — R51 Headache: Secondary | ICD-10-CM | POA: Diagnosis not present

## 2014-12-14 DIAGNOSIS — A047 Enterocolitis due to Clostridium difficile: Secondary | ICD-10-CM

## 2014-12-14 DIAGNOSIS — F411 Generalized anxiety disorder: Secondary | ICD-10-CM

## 2014-12-14 DIAGNOSIS — H539 Unspecified visual disturbance: Secondary | ICD-10-CM

## 2014-12-14 DIAGNOSIS — R519 Headache, unspecified: Secondary | ICD-10-CM

## 2014-12-14 LAB — POCT CBC
GRANULOCYTE PERCENT: 76 % (ref 37–80)
HCT, POC: 45.4 % (ref 37.7–47.9)
HEMOGLOBIN: 14.5 g/dL (ref 12.2–16.2)
Lymph, poc: 1.8 (ref 0.6–3.4)
MCH, POC: 28.4 pg (ref 27–31.2)
MCHC: 31.9 g/dL (ref 31.8–35.4)
MCV: 89.3 fL (ref 80–97)
MID (cbc): 0.6 (ref 0–0.9)
MPV: 7.7 fL (ref 0–99.8)
POC Granulocyte: 7.4 — AB (ref 2–6.9)
POC LYMPH PERCENT: 18.2 %L (ref 10–50)
POC MID %: 5.8 %M (ref 0–12)
Platelet Count, POC: 298 10*3/uL (ref 142–424)
RBC: 5.08 M/uL (ref 4.04–5.48)
RDW, POC: 16.1 %
WBC: 9.7 10*3/uL (ref 4.6–10.2)

## 2014-12-14 LAB — BASIC METABOLIC PANEL
BUN: 13 mg/dL (ref 6–23)
CO2: 23 meq/L (ref 19–32)
CREATININE: 0.83 mg/dL (ref 0.50–1.10)
Calcium: 9.8 mg/dL (ref 8.4–10.5)
Chloride: 104 mEq/L (ref 96–112)
Glucose, Bld: 139 mg/dL — ABNORMAL HIGH (ref 70–99)
POTASSIUM: 4.5 meq/L (ref 3.5–5.3)
Sodium: 138 mEq/L (ref 135–145)

## 2014-12-14 LAB — POCT SEDIMENTATION RATE: POCT SED RATE: 28 mm/hr — AB (ref 0–22)

## 2014-12-14 NOTE — Patient Instructions (Signed)
We will call you once we get the MRI and ultrasound scheduled

## 2014-12-14 NOTE — Progress Notes (Addendum)
   Subjective:  This chart was scribed for Brianna ChrisSteven Daub, MD by Stann Oresung-Kai Tsai, Medical Scribe. This patient was seen in Room 10 and the patient's care was started 1:17 PM.    Patient ID: Brianna Reynolds, female    DOB: Feb 23, 1924, 81 y.o.   MRN: [id number]  HPI Brianna Reynolds is a 81 y.o. female who [facility] a follow up on headaches and CT scan. CT scan was to rule out stroke. Pt has hypertension and hyperlipidemia. [facility]. Pt had a CT scan of her head. Her results showed slightly dense left middle cerebral artery branch vessel new from prior exam. Small thrombus at this level cannot be excluded.   Pt saw Dr. Dione BoozeGroat this past Thursday. He said pt's eyes are fine. Dr. Dione BoozeGroat was concerned something "cut loose and went up."   Pt says she feels good today. Pt does not currently have a headache. Pt is okay with having an MRI done to make sure there is no carotid blockage or mini stroke.      Review of Systems  Constitutional: Negative.   Respiratory: Negative.   Cardiovascular: Negative.   Musculoskeletal: Negative.   Skin: Negative.   Neurological: Negative.   Psychiatric/Behavioral: Negative.        Objective:   Physical Exam CONSTITUTIONAL: Well developed/Wel nourished HEAD: Normocephalic/atraumatic EYES: EOMI/PERRL ENMT: Mucous membranes moist NECK: supple no meningeal signs SPINE/BACK: entire spine nontender CV: S1/S2 noted, no murmurs/rubs/gallops noted LUNGS: Lungs are clear to auscultation bilaterally, no apparent distress ABDOMEN: soft, non tender, no rebound or guarding, bowel sounds noted throughout abdomen GU: no cva tenderness NEURO: Pt is awake/alert/appropriate, moves all extremities x4. No facial droop. EXTREMITIES: pulses normal/equal, full ROM SKIN: warm, color normal PSYCH: no abnormalities of mood noted, alert, and oriented to situation        Assessment & Plan:  Patient looks great. Her diarrhea  is much improved. She has an abnormal area on her CT. We'll evaluate this area with an MRI with contrast and carotid Dopplers. She has had no further problems with her C. difficile Brianna Reynolds

## 2014-12-18 ENCOUNTER — Other Ambulatory Visit: Payer: Self-pay | Admitting: Emergency Medicine

## 2014-12-24 ENCOUNTER — Ambulatory Visit (HOSPITAL_COMMUNITY)
Admission: RE | Admit: 2014-12-24 | Discharge: 2014-12-24 | Disposition: A | Discharge status: Home / Self Care | Payer: Medicare Other | Source: Ambulatory Visit | Attending: Emergency Medicine | Admitting: Emergency Medicine

## 2014-12-24 DIAGNOSIS — R9402 Abnormal brain scan: Secondary | ICD-10-CM | POA: Diagnosis not present

## 2014-12-24 DIAGNOSIS — R930 Abnormal findings on diagnostic imaging of skull and head, not elsewhere classified: Secondary | ICD-10-CM | POA: Diagnosis not present

## 2014-12-24 MED ORDER — GADOBENATE DIMEGLUMINE 529 MG/ML IV SOLN
15.0000 mL | Freq: Once | INTRAVENOUS | Status: AC | PRN
  Administered 2014-12-24: 12 mL via INTRAVENOUS

## 2014-12-26 ENCOUNTER — Telehealth: Payer: Self-pay

## 2014-12-26 DIAGNOSIS — R519 Headache, unspecified: Secondary | ICD-10-CM

## 2014-12-26 DIAGNOSIS — R51 Headache: Principal | ICD-10-CM

## 2014-12-26 DIAGNOSIS — R930 Abnormal findings on diagnostic imaging of skull and head, not elsewhere classified: Secondary | ICD-10-CM

## 2014-12-26 NOTE — Telephone Encounter (Signed)
[facility] Enrigue [facility]Joseph Miller[facility] ph [phone], fax # [phone] called to ask if we can send a referral and records/notes to them. Pt would like to be seen as soon as possible. Joni ReiningNicole, I see that you reviewed the MRI results. Can we refer pt? What Dx would you want to use?

## 2014-12-26 NOTE — Telephone Encounter (Signed)
There were no acute changes seen on MRI. Thought that dense area in artery in the brain may be due to plaque build up rather than a clot.  She should follow up with her neurologist.

## 2014-12-26 NOTE — Telephone Encounter (Signed)
Patient's husband Leonette MostCharles states Dr. Cleta Albertsaub sent her for a MRI and they want to know the results. Cb# [phone].

## 2014-12-26 NOTE — Telephone Encounter (Signed)
Can someone review this MRI for them in Dr Ellis Parentsaub's absence?

## 2014-12-26 NOTE — Telephone Encounter (Signed)
Gave pt's husband results.

## 2014-12-26 NOTE — Telephone Encounter (Signed)
Dr Cleta Albertsaub-- Pts husband wants you to call him.

## 2014-12-27 NOTE — Telephone Encounter (Signed)
Will refer to [facility] headaches and abnormal CT/MRI. Please let patient know.

## 2014-12-30 ENCOUNTER — Other Ambulatory Visit: Payer: Self-pay | Admitting: Emergency Medicine

## 2014-12-30 DIAGNOSIS — R9389 Abnormal findings on diagnostic imaging of other specified body structures: Secondary | ICD-10-CM

## 2014-12-31 NOTE — Telephone Encounter (Signed)
Spoke with pt, advised husband message from Brianna Reynolds. Her husband verbalized understanding.

## 2015-01-07 ENCOUNTER — Other Ambulatory Visit: Payer: Self-pay

## 2015-01-08 DIAGNOSIS — G609 Hereditary and idiopathic neuropathy, unspecified: Secondary | ICD-10-CM | POA: Insufficient documentation

## 2015-01-08 DIAGNOSIS — I679 Cerebrovascular disease, unspecified: Secondary | ICD-10-CM | POA: Insufficient documentation

## 2015-01-08 DIAGNOSIS — I672 Cerebral atherosclerosis: Secondary | ICD-10-CM | POA: Insufficient documentation

## 2015-01-08 DIAGNOSIS — R938 Abnormal findings on diagnostic imaging of other specified body structures: Secondary | ICD-10-CM | POA: Diagnosis not present

## 2015-01-08 DIAGNOSIS — R413 Other amnesia: Secondary | ICD-10-CM | POA: Diagnosis not present

## 2015-01-14 DIAGNOSIS — R938 Abnormal findings on diagnostic imaging of other specified body structures: Secondary | ICD-10-CM | POA: Diagnosis not present

## 2015-02-13 DIAGNOSIS — I672 Cerebral atherosclerosis: Secondary | ICD-10-CM | POA: Diagnosis not present

## 2015-02-13 DIAGNOSIS — I679 Cerebrovascular disease, unspecified: Secondary | ICD-10-CM | POA: Diagnosis not present

## 2015-02-13 DIAGNOSIS — R413 Other amnesia: Secondary | ICD-10-CM | POA: Diagnosis not present

## 2015-02-13 DIAGNOSIS — R938 Abnormal findings on diagnostic imaging of other specified body structures: Secondary | ICD-10-CM | POA: Diagnosis not present

## 2015-03-25 DIAGNOSIS — Z1231 Encounter for screening mammogram for malignant neoplasm of breast: Secondary | ICD-10-CM | POA: Diagnosis not present

## 2015-04-07 ENCOUNTER — Other Ambulatory Visit: Payer: Self-pay | Admitting: Emergency Medicine

## 2015-04-08 NOTE — Telephone Encounter (Signed)
Rx faxed

## 2015-04-09 ENCOUNTER — Encounter: Payer: Self-pay | Admitting: Emergency Medicine

## 2015-04-15 DIAGNOSIS — R413 Other amnesia: Secondary | ICD-10-CM | POA: Diagnosis not present

## 2015-04-15 DIAGNOSIS — R938 Abnormal findings on diagnostic imaging of other specified body structures: Secondary | ICD-10-CM | POA: Diagnosis not present

## 2015-04-15 DIAGNOSIS — G609 Hereditary and idiopathic neuropathy, unspecified: Secondary | ICD-10-CM | POA: Diagnosis not present

## 2015-04-15 DIAGNOSIS — I679 Cerebrovascular disease, unspecified: Secondary | ICD-10-CM | POA: Diagnosis not present

## 2015-05-05 ENCOUNTER — Encounter: Payer: Self-pay | Admitting: Emergency Medicine

## 2015-05-11 ENCOUNTER — Other Ambulatory Visit: Payer: Self-pay | Admitting: Emergency Medicine

## 2015-05-12 ENCOUNTER — Other Ambulatory Visit: Payer: Self-pay | Admitting: Emergency Medicine

## 2015-05-12 NOTE — Progress Notes (Signed)
Will call and try and reschedule mammogram 

## 2015-05-12 NOTE — Telephone Encounter (Signed)
Called in.

## 2015-05-19 ENCOUNTER — Other Ambulatory Visit: Payer: Self-pay | Admitting: Emergency Medicine

## 2015-06-08 ENCOUNTER — Other Ambulatory Visit: Payer: Self-pay | Admitting: Emergency Medicine

## 2015-06-10 NOTE — Telephone Encounter (Signed)
Faxed

## 2015-06-12 ENCOUNTER — Ambulatory Visit (INDEPENDENT_AMBULATORY_CARE_PROVIDER_SITE_OTHER): Payer: Medicare Other | Admitting: Emergency Medicine

## 2015-06-12 VITALS — BP 124/68 | HR 62 | Temp 97.5°F | Resp 18 | Ht 59.0 in | Wt 135.0 lb

## 2015-06-12 DIAGNOSIS — S0011XA Contusion of right eyelid and periocular area, initial encounter: Secondary | ICD-10-CM | POA: Diagnosis not present

## 2015-06-12 DIAGNOSIS — Z23 Encounter for immunization: Secondary | ICD-10-CM | POA: Diagnosis not present

## 2015-06-12 DIAGNOSIS — S0081XA Abrasion of other part of head, initial encounter: Secondary | ICD-10-CM | POA: Diagnosis not present

## 2015-06-12 NOTE — Patient Instructions (Signed)
Eye Contusion An eye contusion is a deep bruise of the eye. This is often called a "black eye." Contusions are the result of an injury that caused bleeding under the skin. The contusion may turn blue, purple, or yellow. Minor injuries will give you a painless contusion, but more severe contusions may stay painful and swollen for a few weeks. If the eye contusion only involves the eyelids and tissues around the eye, the injured area will get better within a few days to weeks. However, eye contusions can be serious and affect the eyeball and sight. CAUSES   Blunt injury or trauma to the face or eye area.  A forehead injury that causes the blood under the skin to work its way down to the eyelids.  Rubbing the eyes due to irritation. SYMPTOMS   Swelling and redness around the eye.  Bruising around the eye.  Tenderness, soreness, or pain around the eye.  Blurry vision.  Tearing.  Eyeball redness. DIAGNOSIS  A diagnosis is usually based on a thorough exam of the eye and surrounding area. The eye must be looked at carefully to make sure it is not injured and to make sure nothing else will threaten your vision. A vision test may be done. An X-ray or computed tomography (CT) scan may be needed to determine if there are any associated injuries, such as broken bones (fractures). TREATMENT  If there is an injury to the eye, treatment will be determined by the nature of the injury. HOME CARE INSTRUCTIONS   Put ice on the injured area.  Put ice in a plastic bag.  Place a towel between your skin and the bag.  Leave the ice on for 15-20 minutes, 03-04 times a day.  If it is determined that there is no injury to the eye, you may continue normal activities.  Sunglasses may be worn to protect your eyes from bright light if light is uncomfortable.  Sleep with your head elevated. You can put an extra pillow under your head. This may help with discomfort.  Only take over-the-counter or  prescription medicines for pain, discomfort, or fever as directed by your caregiver. Do not take aspirin for the first few days. This may increase bruising. SEEK IMMEDIATE MEDICAL CARE IF:   You have any form of vision loss.  You have double vision.  You feel nauseous.  You feel dizzy, sleepy, or like you will faint.  You have any fluid discharge from the eye or your nose.  You have swelling and discoloration that does not fade. MAKE SURE YOU:   Understand these instructions.  Will watch your condition.  Will get help right away if you are not doing well or get worse.   This information is not intended to replace advice given to you by your health care provider. Make sure you discuss any questions you have with your health care provider.   Document Released: 07/15/2000 Document Revised: 10/10/2011 Document Reviewed: 03/24/2015 Elsevier Interactive Patient Education 2016 [employer]  

## 2015-06-12 NOTE — Progress Notes (Signed)
Subjective:  Patient ID: Brianna Reynolds, female    DOB: 11-22-1923  Age: 81 y.o. MRN: [id number]  CC: Eye Injury   HPI Brianna Kirksileen D Cadavid presents   Patient fell while picking up sticks on her front yard. She slid down a hill and landed face first on the driveway. She had no loss of consciousness no neurologic or visual symptoms she has an abrasion on her right forehead. She is got a very large periorbital ecchymosis. She has no neurologic or visual symptoms no neck or back pain denies any other complaints  History Karie Kirksileen has a past medical history of Abnormal EKG; Fatigue; Hyperlipidemia; Hypothyroid; HTN (hypertension); Cataract; Anxiety; C. difficile colitis; and Pneumonia.   She has past surgical history that includes Thyroidectomy, partial; Parathyroidectomy; Breast surgery (Bilateral); Tonsillectomy; Colonoscopy; and Colonoscopy (N/A, 10/27/2014).   Her  family history includes Arthritis in her mother.  She   reports that she quit smoking about 24 years ago. Her smoking use included Cigarettes. She has never used smokeless tobacco. She reports that she does not drink alcohol or use illicit drugs.  Outpatient Prescriptions Prior to Visit  Medication Sig Dispense Refill  . acetaminophen (TYLENOL) 325 MG tablet Take 650 mg by mouth every 6 (six) hours as needed for headache (headache).    Marland Kitchen. aspirin EC 81 MG tablet Take 81 mg by mouth daily.    Marland Kitchen. atorvastatin (LIPITOR) 10 MG tablet One tablet daily 30 tablet 11  . Calcium Carbonate (CALTRATE 600 PO) Take 1 tablet by mouth daily.    . Cholecalciferol (VITAMIN D) 2000 UNITS CAPS Take 1 capsule by mouth daily.     . clonazePAM (KLONOPIN) 0.5 MG tablet TAKE 1 TABLET THREE TIMES DAILY AS NEEDED FOR ANXIETY. 30 tablet 0  . levothyroxine (SYNTHROID) 50 MCG tablet One daily on empty stomach same time of day 30 tablet 11  . Melatonin 5 MG TABS Take 5 mg by mouth at bedtime.     . Multiple Vitamins-Minerals (PRESERVISION AREDS) CAPS Take  1 capsule by mouth daily.    . QUEtiapine (SEROQUEL) 25 MG tablet TAKE 1 TABLET AT BEDTIME AS NEEDED FOR INSOMNIA, MAY REPEAT IN 1-2 HOURS IF NEEDED 60 tablet 0  . sertraline (ZOLOFT) 50 MG tablet Take 1-1/2 tablets daily 45 tablet 11   No facility-administered medications prior to visit.    Social History   Social History  . Marital Status: Married    Spouse Name: N/A  . Number of Children: 5  . Years of Education: N/A   Occupational History  . house wife    Social History Main Topics  . Smoking status: Former Smoker    Types: Cigarettes    Quit date: 02/10/1991  . Smokeless tobacco: Never Used  . Alcohol Use: No     Comment: rarely  . Drug Use: No  . Sexual Activity: Not Asked   Other Topics Concern  . None   Social History Narrative     Review of Systems  Constitutional: Negative for fever, chills and appetite change.  HENT: Negative for congestion, ear pain, postnasal drip, sinus pressure and sore throat.   Eyes: Negative for photophobia, pain, redness and visual disturbance.  Respiratory: Negative for cough, shortness of breath and wheezing.   Cardiovascular: Negative for leg swelling.  Gastrointestinal: Negative for nausea, vomiting, abdominal pain, diarrhea, constipation and blood in stool.  Endocrine: Negative for polyuria.  Genitourinary: Negative for dysuria, urgency, frequency and flank pain.  Musculoskeletal: Negative for gait problem.  Skin: Positive for color change. Negative for rash.  Neurological: Negative for weakness and headaches.  Psychiatric/Behavioral: Negative for confusion and decreased concentration. The patient is not nervous/anxious.     Objective:  BP 124/68 mmHg  Pulse 62  Temp(Src) 97.5 F (36.4 C) (Oral)  Resp 18  Ht  (1.499 m)  Wt 135 lb (61.236 kg)  BMI 27.25 kg/m2  SpO2 98%  Physical Exam  Constitutional: She is oriented to person, place, and time. She appears well-developed and well-nourished.  HENT:  Head:  Normocephalic.  Eyes: Conjunctivae are normal. Pupils are equal, round, and reactive to light. Right pupil is round and reactive. Left pupil is round and reactive.  Pulmonary/Chest: Effort normal.  Musculoskeletal: She exhibits no edema.  Neurological: She is alert and oriented to person, place, and time.  Skin: Skin is dry.  Psychiatric: She has a normal mood and affect. Her behavior is normal. Thought content normal.    she has a very prominent periorbital ecchymosis. Pupils round regular and equally reactive to light and accommodation extraocular movements are intact conjunctiva and sclera clear hyphema. No injection cornea.   Assessment & Plan:   Jaianna was seen today for eye injury.  Diagnoses and all orders for this visit:  Black eye, right, initial encounter  Need for Tdap vaccination  Needs flu shot -     Flu Vaccine QUAD 36+ mos IM  Abrasion of forehead, initial encounter  Other orders -     Tdap vaccine greater than or equal to 7yo IM   I am having Ms. Schauf maintain her Vitamin D, aspirin EC, Calcium Carbonate (CALTRATE 600 PO), PRESERVISION AREDS, Melatonin, acetaminophen, sertraline, levothyroxine, atorvastatin, QUEtiapine, and clonazePAM.  No orders of the defined types were placed in this encounter.    Appropriate red flag conditions were discussed with the patient as well as actions that should be taken.  Patient expressed his understanding.  Follow-up: Return if symptoms worsen or fail to improve.  Carmelina Dane, MD

## 2015-06-26 ENCOUNTER — Encounter: Payer: Self-pay | Admitting: Emergency Medicine

## 2015-06-26 ENCOUNTER — Other Ambulatory Visit: Payer: Self-pay | Admitting: Emergency Medicine

## 2015-06-28 ENCOUNTER — Other Ambulatory Visit: Payer: Self-pay | Admitting: Emergency Medicine

## 2015-07-10 ENCOUNTER — Other Ambulatory Visit: Payer: Self-pay | Admitting: Emergency Medicine

## 2015-07-11 ENCOUNTER — Other Ambulatory Visit: Payer: Self-pay | Admitting: Emergency Medicine

## 2015-07-13 NOTE — Telephone Encounter (Signed)
Faxed clonazepam 

## 2015-07-21 ENCOUNTER — Other Ambulatory Visit: Payer: Self-pay | Admitting: Emergency Medicine

## 2015-07-21 ENCOUNTER — Ambulatory Visit (INDEPENDENT_AMBULATORY_CARE_PROVIDER_SITE_OTHER): Payer: Medicare Other | Admitting: Emergency Medicine

## 2015-07-21 ENCOUNTER — Encounter: Payer: Self-pay | Admitting: Emergency Medicine

## 2015-07-21 VITALS — BP 118/59 | HR 94 | Temp 97.8°F | Resp 16 | Ht 59.0 in | Wt 132.0 lb

## 2015-07-21 DIAGNOSIS — F0390 Unspecified dementia without behavioral disturbance: Secondary | ICD-10-CM | POA: Diagnosis not present

## 2015-07-21 DIAGNOSIS — E785 Hyperlipidemia, unspecified: Secondary | ICD-10-CM | POA: Diagnosis not present

## 2015-07-21 DIAGNOSIS — E038 Other specified hypothyroidism: Secondary | ICD-10-CM

## 2015-07-21 DIAGNOSIS — G4700 Insomnia, unspecified: Secondary | ICD-10-CM

## 2015-07-21 LAB — COMPLETE METABOLIC PANEL WITH GFR
ALK PHOS: 65 U/L (ref 33–130)
ALT: 7 U/L (ref 6–29)
AST: 14 U/L (ref 10–35)
Albumin: 3.9 g/dL (ref 3.6–5.1)
BILIRUBIN TOTAL: 0.6 mg/dL (ref 0.2–1.2)
BUN: 9 mg/dL (ref 7–25)
CALCIUM: 9.5 mg/dL (ref 8.6–10.4)
CO2: 22 mmol/L (ref 20–31)
CREATININE: 0.78 mg/dL (ref 0.60–0.88)
Chloride: 102 mmol/L (ref 98–110)
GFR, EST AFRICAN AMERICAN: 78 mL/min (ref >=60)
GFR, Est Non African American: 68 mL/min (ref >=60)
Glucose, Bld: 88 mg/dL (ref 65–99)
Potassium: 4.3 mmol/L (ref 3.5–5.3)
Sodium: 137 mmol/L (ref 135–146)
TOTAL PROTEIN: 6.8 g/dL (ref 6.1–8.1)

## 2015-07-21 LAB — CBC WITH DIFFERENTIAL/PLATELET
BASOS ABS: 0 10*3/uL (ref 0.0–0.1)
Basophils Relative: 0 % (ref 0–1)
EOS PCT: 1 % (ref 0–5)
Eosinophils Absolute: 0.1 10*3/uL (ref 0.0–0.7)
HEMATOCRIT: 42.4 % (ref 36.0–46.0)
Hemoglobin: 14.3 g/dL (ref 12.0–15.0)
LYMPHS ABS: 1.7 10*3/uL (ref 0.7–4.0)
LYMPHS PCT: 17 % (ref 12–46)
MCH: 29.8 pg (ref 26.0–34.0)
MCHC: 33.7 g/dL (ref 30.0–36.0)
MCV: 88.3 fL (ref 78.0–100.0)
MPV: 9.6 fL (ref 8.6–12.4)
Monocytes Absolute: 0.8 10*3/uL (ref 0.1–1.0)
Monocytes Relative: 8 % (ref 3–12)
Neutro Abs: 7.3 10*3/uL (ref 1.7–7.7)
Neutrophils Relative %: 74 % (ref 43–77)
Platelets: 369 10*3/uL (ref 150–400)
RBC: 4.8 MIL/uL (ref 3.87–5.11)
RDW: 13.6 % (ref 11.5–15.5)
WBC: 9.8 10*3/uL (ref 4.0–10.5)

## 2015-07-21 LAB — LIPID PANEL
CHOLESTEROL: 170 mg/dL (ref 125–200)
HDL: 61 mg/dL (ref >=46)
LDL Cholesterol: 77 mg/dL (ref <130)
TRIGLYCERIDES: 161 mg/dL — AB (ref <150)
Total CHOL/HDL Ratio: 2.8 Ratio (ref <=5.0)
VLDL: 32 mg/dL — ABNORMAL HIGH (ref <30)

## 2015-07-21 LAB — TSH: TSH: 5.566 u[IU]/mL — ABNORMAL HIGH (ref 0.350–4.500)

## 2015-07-21 MED ORDER — QUETIAPINE FUMARATE 25 MG PO TABS: ORAL_TABLET | ORAL | Status: DC

## 2015-07-21 MED ORDER — ATORVASTATIN CALCIUM 10 MG PO TABS: ORAL_TABLET | ORAL | Status: DC

## 2015-07-21 MED ORDER — LEVOTHYROXINE SODIUM 50 MCG PO TABS: 50.0000 ug | ORAL_TABLET | Freq: Every day | ORAL | Status: DC

## 2015-07-21 NOTE — Progress Notes (Addendum)
Subjective:  This chart was scribed for Collene Gobble, MD by Veverly Fells, at [facility].  This patient was seen in room 28 and the patient's care was started at 10:59 AM.    Patient ID: Brianna Reynolds, female    DOB: [birth year], 82 y.o.   MRN: [id number] Chief Complaint  Patient presents with  . Follow-up  . Hypertension   HPI  HPI Comments: Brianna Reynolds is a 81 y.o. female with a history of anxiety who presents to the [facility] for a follow up. Patient states that she feels good. Per husband, his wife has not been in distress or have any anxiety lately but has been having slight memory issues.  Patient has not had any changes in her medication recently.  She is up to date with her flu shot. She has no other complaints or concerns today.   Patient Active Problem List   Diagnosis Date Noted  . Clostridium difficile diarrhea   . Cough   . Recurrent Clostridium difficile diarrhea 06/13/2014  . Leukocytosis 06/13/2014  . Dehydration 06/13/2014  . Blood poisoning (HCC)   . Hypothyroid   . Insomnia 02/28/2013  . Abnormal serum protein electrophoresis 02/11/2013  . Abnormal EKG   . HTN (hypertension)   . Fatigue    Past Medical History  Diagnosis Date  . Abnormal EKG   . Fatigue   . Hyperlipidemia   . Hypothyroid   . HTN (hypertension)     New  . Cataract   . Anxiety   . C. difficile colitis   . Pneumonia    Past Surgical History  Procedure Laterality Date  . Thyroidectomy, partial    . Parathyroidectomy    . Breast surgery Bilateral   . Tonsillectomy    . Colonoscopy    . Colonoscopy N/A 10/27/2014    Procedure: COLONOSCOPY;  Surgeon: Iva Boop, MD;  Location: [facility] ENDOSCOPY;  Service: Endoscopy;  Laterality: N/A;   Allergies  Allergen Reactions  . Penicillins Rash   Prior to Admission medications   Medication Sig Start Date End Date Taking? Authorizing Provider  acetaminophen (TYLENOL) 325 MG tablet  Take 650 mg by mouth every 6 (six) hours as needed for headache (headache).    Historical Provider, MD  aspirin EC 81 MG tablet Take 81 mg by mouth daily.    Historical Provider, MD  atorvastatin (LIPITOR) 10 MG tablet One tablet daily 07/01/14   Collene Gobble, MD  Calcium Carbonate (CALTRATE 600 PO) Take 1 tablet by mouth daily.    Historical Provider, MD  Cholecalciferol (VITAMIN D) 2000 UNITS CAPS Take 1 capsule by mouth daily.     Historical Provider, MD  clonazePAM (KLONOPIN) 0.5 MG tablet TAKE 1 TABLET THREE TIMES DAILY AS NEEDED FOR ANXIETY. 07/11/15   Collene Gobble, MD  levothyroxine (SYNTHROID) 50 MCG tablet Take 1 tablet (50 mcg total) by mouth daily before breakfast. PATIENT NEEDS OFFICE VISIT/ TSH LAB FOR ADDITIONAL REFILLS 07/10/15   Collene Gobble, MD  Melatonin 5 MG TABS Take 5 mg by mouth at bedtime.     Historical Provider, MD  Multiple Vitamins-Minerals (PRESERVISION AREDS) CAPS Take 1 capsule by mouth daily.    Historical Provider, MD  QUEtiapine (SEROQUEL) 25 MG tablet TAKE 1 TABLET AT BEDTIME AS NEEDED FOR INSOMNIA, MAY REPEAT IN 1-2 HOURS IF NEEDED 06/29/15   Collene Gobble, MD  sertraline (ZOLOFT) 50 MG tablet TAKE 1 &  1/2 TABLETS ONCE DAILY 06/26/15   Collene GobbleSteven A Valicia Rief, MD   Social History   Social History  . Marital Status: Married    Spouse Name: N/A  . Number of Children: 5  . Years of Education: N/A   Occupational History  . house wife    Social History Main Topics  . Smoking status: Former Smoker    Types: Cigarettes    Quit date: 02/10/1991  . Smokeless tobacco: Never Used  . Alcohol Use: No     Comment: rarely  . Drug Use: No  . Sexual Activity: Not on file   Other Topics Concern  . Not on file   Social History Narrative    Review of Systems  Constitutional: Negative for fever and chills.  Eyes: Negative for pain, redness and itching.  Respiratory: Negative for cough and choking.   Gastrointestinal: Negative for nausea and vomiting.  Musculoskeletal:  Negative for neck pain and neck stiffness.  Neurological: Negative for seizures and speech difficulty.       Objective:   Physical Exam Filed Vitals:   07/21/15 1049  BP: 118/59  Pulse: 94  Temp: 97.8 F (36.6 C)  Resp: 16  Height: 4\' 11"  (1.499 m)  Weight: 132 lb (59.875 kg)     CONSTITUTIONAL: Well developed/well nourished HEAD: Normocephalic/atraumatic EYES: EOMI/PERRL SPINE/BACK:entire spine nontender CV: S1/S2 noted, no murmurs/rubs/gallops noted LUNGS: Lungs are clear to auscultation bilaterally, no apparent distress NEURO: Pt is awake/alert/appropriate, moves all extremitiesx4.  No facial droop.   EXTREMITIES: pulses normal/equal, full ROM SKIN: warm, color normal PSYCH: no abnormalities of mood noted, alert and oriented to situation     Assessment & Plan:   patient looks great meds were refilled we'll recheck in 6 months. Flu shot recently given meds were refilled. There will be no change in her medications.I personally performed the services described in this documentation, which was scribed in my presence. The recorded information has been reviewed and is accurate. Brianna DibbleSteve Aamari West M.D.

## 2015-07-22 LAB — T4, FREE: Free T4: 1.28 ng/dL (ref 0.80–1.80)

## 2015-08-13 ENCOUNTER — Other Ambulatory Visit: Payer: Self-pay | Admitting: Emergency Medicine

## 2015-08-14 NOTE — Telephone Encounter (Signed)
Pharm called because pt is out of medication. Dr Cleta Albertsaub, do you want to give RFs?

## 2015-08-14 NOTE — Telephone Encounter (Signed)
Called in Rx and asked pharm to let pt know when Rx is ready.

## 2015-08-26 ENCOUNTER — Other Ambulatory Visit: Payer: Self-pay | Admitting: Emergency Medicine

## 2015-10-14 DIAGNOSIS — R938 Abnormal findings on diagnostic imaging of other specified body structures: Secondary | ICD-10-CM | POA: Diagnosis not present

## 2015-10-14 DIAGNOSIS — I679 Cerebrovascular disease, unspecified: Secondary | ICD-10-CM | POA: Diagnosis not present

## 2015-10-14 DIAGNOSIS — R413 Other amnesia: Secondary | ICD-10-CM | POA: Diagnosis not present

## 2015-10-14 DIAGNOSIS — G609 Hereditary and idiopathic neuropathy, unspecified: Secondary | ICD-10-CM | POA: Diagnosis not present

## 2015-10-14 DIAGNOSIS — I672 Cerebral atherosclerosis: Secondary | ICD-10-CM | POA: Diagnosis not present

## 2015-10-14 DIAGNOSIS — Z6826 Body mass index (BMI) 26.0-26.9, adult: Secondary | ICD-10-CM | POA: Diagnosis not present

## 2015-10-14 DIAGNOSIS — F411 Generalized anxiety disorder: Secondary | ICD-10-CM | POA: Diagnosis not present

## 2015-12-16 DIAGNOSIS — H02834 Dermatochalasis of left upper eyelid: Secondary | ICD-10-CM | POA: Diagnosis not present

## 2015-12-16 DIAGNOSIS — H353130 Nonexudative age-related macular degeneration, bilateral, stage unspecified: Secondary | ICD-10-CM | POA: Diagnosis not present

## 2015-12-16 DIAGNOSIS — H02831 Dermatochalasis of right upper eyelid: Secondary | ICD-10-CM | POA: Diagnosis not present

## 2015-12-16 DIAGNOSIS — Z961 Presence of intraocular lens: Secondary | ICD-10-CM | POA: Diagnosis not present

## 2015-12-16 DIAGNOSIS — H04123 Dry eye syndrome of bilateral lacrimal glands: Secondary | ICD-10-CM | POA: Diagnosis not present

## 2016-01-04 ENCOUNTER — Other Ambulatory Visit: Payer: Self-pay | Admitting: Emergency Medicine

## 2016-01-05 NOTE — Telephone Encounter (Signed)
Faxed

## 2016-01-20 ENCOUNTER — Ambulatory Visit (INDEPENDENT_AMBULATORY_CARE_PROVIDER_SITE_OTHER): Payer: Medicare Other | Admitting: Physician Assistant

## 2016-01-20 VITALS — BP 130/68 | HR 96 | Temp 98.3°F | Resp 16 | Ht 59.0 in | Wt 140.2 lb

## 2016-01-20 DIAGNOSIS — D72829 Elevated white blood cell count, unspecified: Secondary | ICD-10-CM | POA: Diagnosis not present

## 2016-01-20 DIAGNOSIS — N390 Urinary tract infection, site not specified: Secondary | ICD-10-CM | POA: Diagnosis not present

## 2016-01-20 DIAGNOSIS — R251 Tremor, unspecified: Secondary | ICD-10-CM | POA: Diagnosis not present

## 2016-01-20 DIAGNOSIS — R82998 Other abnormal findings in urine: Secondary | ICD-10-CM

## 2016-01-20 LAB — POC MICROSCOPIC URINALYSIS (UMFC): MUCUS RE: ABSENT

## 2016-01-20 LAB — POCT URINALYSIS DIP (MANUAL ENTRY)
BILIRUBIN UA: NEGATIVE
GLUCOSE UA: NEGATIVE
Nitrite, UA: NEGATIVE
PROTEIN UA: NEGATIVE
SPEC GRAV UA: 1.015
Urobilinogen, UA: 0.2
pH, UA: 5.5

## 2016-01-20 LAB — COMPLETE METABOLIC PANEL WITH GFR
ALT: 9 U/L (ref 6–29)
AST: 18 U/L (ref 10–35)
Albumin: 4.1 g/dL (ref 3.6–5.1)
Alkaline Phosphatase: 72 U/L (ref 33–130)
BUN: 16 mg/dL (ref 7–25)
CHLORIDE: 104 mmol/L (ref 98–110)
CO2: 20 mmol/L (ref 20–31)
CREATININE: 0.76 mg/dL (ref 0.60–0.88)
Calcium: 10.2 mg/dL (ref 8.6–10.4)
GFR, Est African American: 80 mL/min (ref >=60)
GFR, Est Non African American: 70 mL/min (ref >=60)
GLUCOSE: 88 mg/dL (ref 65–99)
POTASSIUM: 3.9 mmol/L (ref 3.5–5.3)
SODIUM: 138 mmol/L (ref 135–146)
Total Bilirubin: 0.5 mg/dL (ref 0.2–1.2)
Total Protein: 7 g/dL (ref 6.1–8.1)

## 2016-01-20 LAB — POCT CBC
GRANULOCYTE PERCENT: 73 % (ref 37–80)
HCT, POC: 43 % (ref 37.7–47.9)
HEMOGLOBIN: 14.4 g/dL (ref 12.2–16.2)
Lymph, poc: 2.1 (ref 0.6–3.4)
MCH: 29.7 pg (ref 27–31.2)
MCHC: 33.4 g/dL (ref 31.8–35.4)
MCV: 89 fL (ref 80–97)
MID (CBC): 1.1 — AB (ref 0–0.9)
MPV: 7.5 fL (ref 0–99.8)
PLATELET COUNT, POC: 285 10*3/uL (ref 142–424)
POC Granulocyte: 8.6 — AB (ref 2–6.9)
POC LYMPH PERCENT: 17.9 %L (ref 10–50)
POC MID %: 9.1 %M (ref 0–12)
RBC: 4.83 M/uL (ref 4.04–5.48)
RDW, POC: 14.8 %
WBC: 11.8 10*3/uL — AB (ref 4.6–10.2)

## 2016-01-20 LAB — GLUCOSE, POCT (MANUAL RESULT ENTRY): POC GLUCOSE: 95 mg/dL (ref 70–99)

## 2016-01-20 MED ORDER — SULFAMETHOXAZOLE-TRIMETHOPRIM 800-160 MG PO TABS: 1.0000 | ORAL_TABLET | Freq: Two times a day (BID) | ORAL | Status: DC

## 2016-01-20 MED ORDER — ALIGN PO CAPS: 1.0000 | ORAL_CAPSULE | Freq: Every day | ORAL | Status: DC

## 2016-01-20 NOTE — Patient Instructions (Addendum)
     IF you received an x-ray today, you will receive an invoice from [facility]. Please contact [facility] at [phone] with questions or concerns regarding your invoice.   IF you received labwork today, you will receive an invoice from [facility]. Please contact [facility] at [phone] with questions or concerns regarding your invoice.   Our billing staff will not be able to assist you with questions regarding bills from these companies.  You will be contacted with the lab results as soon as they are available. The fastest way to get your results is to activate your My Chart account. Instructions are located on the last page of this paperwork. If you have not heard from us regarding the results in 2 weeks, please contact this office.      

## 2016-01-20 NOTE — Progress Notes (Signed)
Brianna Reynolds  MRN: [id number] DOB: 25-Sep-1923  Subjective:  Pt presents to clinic with shaking that started this am.  She woke up and felt normal and she was involved in her daily tasks and she started to feel shaky - her hands were shaking and so were her legs.  She did not feel anxious when she started to shake.  She is having no urinary symptom currently.  After she started to shake her husband gave her a Klonopin and it calmed the shaking down.  She is really worried about the shaking and what is causing it.  She is here with her daughter today.  Ate normal breakfast this am  Patient Active Problem List   Diagnosis Date Noted  . Clostridium difficile diarrhea   . Cough   . Recurrent Clostridium difficile diarrhea 06/13/2014  . Leukocytosis 06/13/2014  . Dehydration 06/13/2014  . Blood poisoning (HCC)   . Hypothyroid   . Insomnia 02/28/2013  . Abnormal serum protein electrophoresis 02/11/2013  . Abnormal EKG   . HTN (hypertension)   . Fatigue     Current Outpatient Prescriptions on File Prior to Visit  Medication Sig Dispense Refill  . acetaminophen (TYLENOL) 325 MG tablet Take 650 mg by mouth every 6 (six) hours as needed for headache (headache).    Marland Kitchen. aspirin EC 81 MG tablet Take 81 mg by mouth daily.    Marland Kitchen. atorvastatin (LIPITOR) 10 MG tablet One tablet daily 30 tablet 11  . Calcium Carbonate (CALTRATE 600 PO) Take 1 tablet by mouth daily.    . Cholecalciferol (VITAMIN D) 2000 UNITS CAPS Take 1 capsule by mouth daily.     . clonazePAM (KLONOPIN) 0.5 MG tablet TAKE 1 TABLET THREE TIMES DAILY AS NEEDED FOR ANXIETY. 30 tablet 0  . levothyroxine (SYNTHROID) 50 MCG tablet Take 1 tablet (50 mcg total) by mouth daily before breakfast. 30 tablet 11  . Melatonin 5 MG TABS Take 5 mg by mouth at bedtime.     . Multiple Vitamins-Minerals (PRESERVISION AREDS) CAPS Take 1 capsule by mouth daily.    . QUEtiapine (SEROQUEL) 25 MG tablet TAKE 1 TABLET AT BEDTIME AS NEEDED FOR INSOMNIA,  MAY REPEAT IN 1-2 HOURS IF NEEDED 60 tablet 11  . sertraline (ZOLOFT) 50 MG tablet TAKE 1&1/2 TABLETS ONCE DAILY. 45 tablet 1   No current facility-administered medications on file prior to visit.    Allergies  Allergen Reactions  . Penicillins Rash    Review of Systems  Constitutional: Negative for fever and chills.  HENT: Negative for congestion.   Respiratory: Negative for cough.   Neurological: Negative for dizziness and headaches.   Objective:  BP 130/68 mmHg  Pulse 96  Temp(Src) 98.3 F (36.8 C) (Oral)  Resp 16  Ht 4\' 11"  (1.499 m)  Wt 140 lb 3.2 oz (63.594 kg)  BMI 28.30 kg/m2  SpO2 94%  Physical Exam  Constitutional: She is well-developed, well-nourished, and in no distress.  HENT:  Head: Normocephalic and atraumatic.  Right Ear: Hearing and external ear normal.  Left Ear: Hearing and external ear normal.  Eyes: Conjunctivae are normal.  Neck: Normal range of motion.  Cardiovascular: Normal rate, regular rhythm and normal heart sounds.   No murmur heard. Pulmonary/Chest: Effort normal and breath sounds normal. She has no wheezes.  Musculoskeletal:  Pt has slight shaking in her hands and legs and feet, no tremor.  Good strength and sensation.  Neurological: She is alert. She has normal sensation, normal strength  and normal reflexes. Gait normal.  Skin: Skin is warm and dry.  Psychiatric: Mood, memory, affect and judgment normal.  Vitals reviewed.    Results for orders placed or performed in visit on 01/20/16  POCT glucose (manual entry)  Result Value Ref Range   POC Glucose 95 70 - 99 mg/dl  POCT CBC  Result Value Ref Range   WBC 11.8 (A) 4.6 - 10.2 K/uL   Lymph, poc 2.1 0.6 - 3.4   POC LYMPH PERCENT 17.9 10 - 50 %L   MID (cbc) 1.1 (A) 0 - 0.9   POC MID % 9.1 0 - 12 %M   POC Granulocyte 8.6 (A) 2 - 6.9   Granulocyte percent 73.0 37 - 80 %G   RBC 4.83 4.04 - 5.48 M/uL   Hemoglobin 14.4 12.2 - 16.2 g/dL   HCT, POC 16.1 09.6 - 47.9 %   MCV 89.0 80 -  97 fL   MCH, POC 29.7 27 - 31.2 pg   MCHC 33.4 31.8 - 35.4 g/dL   RDW, POC 04.5 %   Platelet Count, POC 285 142 - 424 K/uL   MPV 7.5 0 - 99.8 fL  POCT urinalysis dipstick  Result Value Ref Range   Color, UA yellow yellow   Clarity, UA clear clear   Glucose, UA negative negative   Bilirubin, UA negative negative   Ketones, POC UA small (15) (A) negative   Spec Grav, UA 1.015    Blood, UA moderate (A) negative   pH, UA 5.5    Protein Ur, POC negative negative   Urobilinogen, UA 0.2    Nitrite, UA Negative Negative   Leukocytes, UA small (1+) (A) Negative  POCT Microscopic Urinalysis ([facility])  Result Value Ref Range   WBC,UR,HPF,POC Few (A) None WBC/hpf   RBC,UR,HPF,POC Moderate (A) None RBC/hpf   Bacteria None None, Too numerous to count   Mucus Absent Absent   Epithelial Cells, UR Per Microscopy Many (A) None, Too numerous to count cells/hpf     Assessment and Plan :  Shaking - Plan: POCT glucose (manual entry), POCT CBC, COMPLETE METABOLIC PANEL WITH GFR  Leukocytosis - Plan: POCT urinalysis dipstick, POCT Microscopic Urinalysis ([facility])  Urine WBC increased - Plan: Urine culture, sulfamethoxazole-trimethoprim (BACTRIM DS,SEPTRA DS) 800-160 MG tablet, bifidobacterium infantis (ALIGN) capsule   Pt shaking is likely to be related to her anxiety/nerves.  Due to her new leukocytosis and hematuria we will start an abx while waiting on the urine culture but if the urine culture is neg she will be instructed to stop the abx.  She will start a probiotic because of her past h/o C diff.  She will continue to hydrate as she has been doing.  She will use her Klonopin as needed for her nerves.  Her daughter understands the plan and agrees.  Benny Lennert PA-C  [facility] 01/20/2016 4:22 PM

## 2016-01-22 ENCOUNTER — Telehealth: Payer: Self-pay

## 2016-01-22 LAB — URINE CULTURE

## 2016-01-22 NOTE — Telephone Encounter (Signed)
Pts husband stated Maralyn SagoSarah called and was returning the call back about his wife.  Please advise  [phone]

## 2016-01-27 ENCOUNTER — Telehealth: Payer: Self-pay

## 2016-01-27 NOTE — Telephone Encounter (Signed)
Monroe Sinkita from [facility] [employer] [employer] and is requesting that Benny LennertSarah Weber give her call back. When I asked what in regards to she just stated that she needs a call. [phone]

## 2016-01-28 NOTE — Telephone Encounter (Signed)
Please let the husband know that she had a bacteria in her urine that is in a womens vaginal flora and as long as she is not having symptoms she should stop the abx.

## 2016-02-01 NOTE — Telephone Encounter (Signed)
Pt advised.

## 2016-02-15 ENCOUNTER — Other Ambulatory Visit: Payer: Self-pay | Admitting: Emergency Medicine

## 2016-02-16 NOTE — Telephone Encounter (Signed)
Rx sent in for Clonazepam.

## 2016-02-17 NOTE — Telephone Encounter (Signed)
Faxed

## 2016-03-26 ENCOUNTER — Other Ambulatory Visit: Payer: Self-pay | Admitting: Emergency Medicine

## 2016-03-28 ENCOUNTER — Other Ambulatory Visit: Payer: Self-pay | Admitting: Emergency Medicine

## 2016-03-28 NOTE — Telephone Encounter (Signed)
Faxed

## 2016-03-30 ENCOUNTER — Encounter: Payer: Self-pay | Admitting: Family Medicine

## 2016-03-30 DIAGNOSIS — Z1231 Encounter for screening mammogram for malignant neoplasm of breast: Secondary | ICD-10-CM | POA: Diagnosis not present

## 2016-03-30 LAB — HM MAMMOGRAPHY

## 2016-04-08 ENCOUNTER — Telehealth: Payer: Self-pay

## 2016-04-08 NOTE — Telephone Encounter (Signed)
I do not have her most recent mammogram report. Will you please go ahead and get this faxed to me so I can look at it. I can then send it to Dr. Clelia CroftShaw

## 2016-04-08 NOTE — Telephone Encounter (Signed)
-----   Message from Collene GobbleSteven A Daub, MD sent at 02/18/2016  7:15 AM EDT ----- Call patient's daughter. She did not have the ultrasound of the carotids ordered about a year ago but I will leave this up to the discretion of her neurologist she sees regular. They can discuss this with him at their next office visit. [place]. ----- Message -----    From: SYSTEM    Sent: 02/18/2016  12:05 AM      To: Collene GobbleSteven A Daub, MD

## 2016-04-08 NOTE — Telephone Encounter (Signed)
Spoke with patient's husband and gave message per Dr. Cleta Albertsaub.  Husband states she is [place] [place] [facility].  Dr. Hyacinth MeekerMiller has reviewed the MRI/CT.  Husband will mention to Dr. Hyacinth MeekerMiller about the U/S of carotids.  Husband states Dr Cleta Albertsaub had referred pt to Dr. Norberto SorensonEva Shaw and he want to make sure Dr. Clelia CroftShaw is aware and sees her most recent mammogram report.

## 2016-04-12 NOTE — Telephone Encounter (Signed)
Spoke to husband who reported that the nurse he spoke to before told him that she had the MM report and was putting it in Dr Alver FisherShaw's box. [facility] [address] I checked Dr Alver FisherShaw's box and do not see it. [facility] today, to my / Dr Ellis Parentsaub's attention.

## 2016-04-13 DIAGNOSIS — R413 Other amnesia: Secondary | ICD-10-CM | POA: Diagnosis not present

## 2016-04-13 DIAGNOSIS — I679 Cerebrovascular disease, unspecified: Secondary | ICD-10-CM | POA: Diagnosis not present

## 2016-04-13 DIAGNOSIS — Z6827 Body mass index (BMI) 27.0-27.9, adult: Secondary | ICD-10-CM | POA: Diagnosis not present

## 2016-04-13 DIAGNOSIS — G609 Hereditary and idiopathic neuropathy, unspecified: Secondary | ICD-10-CM | POA: Diagnosis not present

## 2016-04-13 DIAGNOSIS — R938 Abnormal findings on diagnostic imaging of other specified body structures: Secondary | ICD-10-CM | POA: Diagnosis not present

## 2016-04-13 DIAGNOSIS — F411 Generalized anxiety disorder: Secondary | ICD-10-CM | POA: Diagnosis not present

## 2016-04-13 DIAGNOSIS — I672 Cerebral atherosclerosis: Secondary | ICD-10-CM | POA: Diagnosis not present

## 2016-05-25 ENCOUNTER — Ambulatory Visit (INDEPENDENT_AMBULATORY_CARE_PROVIDER_SITE_OTHER): Payer: Medicare Other | Admitting: Family Medicine

## 2016-05-25 DIAGNOSIS — Z23 Encounter for immunization: Secondary | ICD-10-CM | POA: Diagnosis not present

## 2016-06-17 ENCOUNTER — Other Ambulatory Visit: Payer: Self-pay | Admitting: *Deleted

## 2016-06-17 ENCOUNTER — Telehealth: Payer: Self-pay

## 2016-06-17 ENCOUNTER — Other Ambulatory Visit (INDEPENDENT_AMBULATORY_CARE_PROVIDER_SITE_OTHER): Payer: Medicare Other | Admitting: Family Medicine

## 2016-06-17 DIAGNOSIS — R32 Unspecified urinary incontinence: Secondary | ICD-10-CM

## 2016-06-17 DIAGNOSIS — N3001 Acute cystitis with hematuria: Secondary | ICD-10-CM

## 2016-06-17 LAB — POCT URINALYSIS DIP (MANUAL ENTRY)
Bilirubin, UA: NEGATIVE
Glucose, UA: NEGATIVE
Leukocytes, UA: NEGATIVE
Nitrite, UA: NEGATIVE
PROTEIN UA: NEGATIVE
RBC UA: NEGATIVE
SPEC GRAV UA: 1.01
UROBILINOGEN UA: 0.2
pH, UA: 5.5

## 2016-06-17 LAB — POC MICROSCOPIC URINALYSIS (UMFC): MUCUS RE: ABSENT

## 2016-06-17 NOTE — Telephone Encounter (Signed)
Spoke to Mr. Cawthorn.  Gave him message from Dr. Clelia CroftShaw.  He said that he would collect it and have it brought into the office.  He was very appreciative of our speedy response!

## 2016-06-17 NOTE — Telephone Encounter (Signed)
Could be uti - drop off urine sample, future orders entered.  If ua is normal, will need to be seen.

## 2016-06-17 NOTE — Telephone Encounter (Signed)
Pt husband called and is needing to speak with a provider about his wife he would not go into detail with me about this   Best number [phone]

## 2016-06-17 NOTE — Telephone Encounter (Signed)
Called patient's husband, and he states that patient has been wetting herself.  He states this has been going on approx 2-3 months.  The other night she wet the bed.  The amount varies depending on the day.  He is wanting advice on what he should do.  He states that she was originally followed by Dr. Cleta Albertsaub but now follows Dr. Clelia CroftShaw.  Further mentions that the [facility] and she had a reaction to the antibiotic she was given, however, he does not know the name of it.  Please advise.

## 2016-06-19 LAB — URINE CULTURE

## 2016-06-20 ENCOUNTER — Telehealth: Payer: Self-pay

## 2016-06-20 MED ORDER — CEPHALEXIN 500 MG PO CAPS: 500.0000 mg | ORAL_CAPSULE | Freq: Two times a day (BID) | ORAL | 0 refills | Status: DC

## 2016-06-20 NOTE — Telephone Encounter (Signed)
Received a call from pharmacy regarding cephalosporin and patient having a PCN reaction.  Dr. Clelia CroftShaw was not in office so I consulted with Dr. Neva SeatGreene.  He advised patient's reaction is only a rash so it should be fine to fill medication.  He asked me to advise pharmacist that if a rash develops for patient to discontinue medication and call our office and to route a message to Dr. Clelia CroftShaw with the plan.  I did this.

## 2016-06-20 NOTE — Telephone Encounter (Signed)
Perfect, thanks 

## 2016-06-20 NOTE — Progress Notes (Signed)
Please let pt know that she did have bacteria in her urine. However it is a common type of bacteria that is often found on the outside of the skin and so sometimes does not represent a true infection. However I think we should proceed with treatment due to the quantity of it in patient's urinary symptoms. Cephalexin, and antibiotic, has been sent to patient's pharmacy for her to take twice daily for 1 week. If she continues to have any problems, please follow-up in an office visit.

## 2016-07-04 ENCOUNTER — Ambulatory Visit (INDEPENDENT_AMBULATORY_CARE_PROVIDER_SITE_OTHER): Payer: Medicare Other | Admitting: Family Medicine

## 2016-07-04 VITALS — BP 120/80 | HR 87 | Temp 98.1°F | Resp 16 | Ht <= 58 in | Wt 136.0 lb

## 2016-07-04 DIAGNOSIS — R350 Frequency of micturition: Secondary | ICD-10-CM | POA: Diagnosis not present

## 2016-07-04 LAB — POC MICROSCOPIC URINALYSIS (UMFC): MUCUS RE: ABSENT

## 2016-07-04 LAB — POCT URINALYSIS DIP (MANUAL ENTRY)
Bilirubin, UA: NEGATIVE
Glucose, UA: NEGATIVE
Nitrite, UA: NEGATIVE
PH UA: 5.5
UROBILINOGEN UA: 0.2

## 2016-07-04 MED ORDER — PHENAZOPYRIDINE HCL 100 MG PO TABS: 100.0000 mg | ORAL_TABLET | Freq: Three times a day (TID) | ORAL | 0 refills | Status: DC | PRN

## 2016-07-04 NOTE — Progress Notes (Signed)
Chief Complaint  Patient presents with  . Follow-up    frequently urinating, x 2 weeks    HPI  Pt reports that for 2 weeks she has been urinating more frequently She reports that at night she wakes up once a night to urinate In the daytime she feels like she has to go 4-5 times a day She is not drinking increased fluids She denies any dysuria, no hesitancy  Past Medical History:  Diagnosis Date  . Abnormal EKG   . Anxiety   . C. difficile colitis   . Cataract   . Fatigue   . HTN (hypertension)    New  . Hyperlipidemia   . Hypothyroid   . Pneumonia     Current Outpatient Prescriptions  Medication Sig Dispense Refill  . acetaminophen (TYLENOL) 325 MG tablet Take 650 mg by mouth every 6 (six) hours as needed for headache (headache).    Marland Kitchen. aspirin EC 81 MG tablet Take 81 mg by mouth daily.    Marland Kitchen. atorvastatin (LIPITOR) 10 MG tablet One tablet daily 30 tablet 11  . bifidobacterium infantis (ALIGN) capsule Take 1 capsule by mouth daily. 30 capsule 12  . Calcium Carbonate (CALTRATE 600 PO) Take 1 tablet by mouth daily.    . cephALEXin (KEFLEX) 500 MG capsule Take 1 capsule (500 mg total) by mouth 2 (two) times daily. 14 capsule 0  . Cholecalciferol (VITAMIN D) 2000 UNITS CAPS Take 1 capsule by mouth daily.     . clonazePAM (KLONOPIN) 0.5 MG tablet TAKE 1 TABLET THREE TIMES DAILY AS NEEDED FOR ANXIETY. 30 tablet 0  . donepezil (ARICEPT) 10 MG tablet Take 10 mg by mouth.    . levothyroxine (SYNTHROID) 50 MCG tablet Take 1 tablet (50 mcg total) by mouth daily before breakfast. 30 tablet 11  . Melatonin 5 MG TABS Take 5 mg by mouth at bedtime.     . Multiple Vitamins-Minerals (PRESERVISION AREDS) CAPS Take 1 capsule by mouth daily.    . QUEtiapine (SEROQUEL) 25 MG tablet TAKE 1 TABLET AT BEDTIME AS NEEDED FOR INSOMNIA, MAY REPEAT IN 1-2 HOURS IF NEEDED 60 tablet 11  . sertraline (ZOLOFT) 50 MG tablet TAKE 1&1/2 TABLETS ONCE DAILY. 45 tablet 1   No current facility-administered  medications for this visit.     Allergies:  Allergies  Allergen Reactions  . Penicillins Rash    Past Surgical History:  Procedure Laterality Date  . BREAST SURGERY Bilateral   . COLONOSCOPY    . COLONOSCOPY N/A 10/27/2014   Procedure: COLONOSCOPY;  Surgeon: Iva Booparl E Gessner, MD;  [facility];  Service: Endoscopy;  Laterality: N/A;  . PARATHYROIDECTOMY    . THYROIDECTOMY, PARTIAL    . TONSILLECTOMY      Social History   Social History  . Marital status: Married    Spouse name: N/A  . Number of children: 5  . Years of education: N/A   Occupational History  . house wife    Social History Main Topics  . Smoking status: Former Smoker    Types: Cigarettes    Quit date: 02/10/1991  . Smokeless tobacco: Never Used  . Alcohol use No     Comment: rarely  . Drug use: No  . Sexual activity: Not Asked   Other Topics Concern  . None   Social History Narrative  . None    ROS See hpi  Objective: Vitals:   07/04/16 1555  BP: 120/80  Pulse: 87  Resp: 16  Temp: 98.1  F (36.7 C)  TempSrc: Oral  SpO2: 96%  Weight: 136 lb (61.7 kg)  Height: 4\' 10"  (1.473 m)    Physical Exam  Constitutional: She is oriented to person, place, and time. She appears well-developed and well-nourished.  HENT:  Head: Normocephalic and atraumatic.  Right Ear: External ear normal.  Left Ear: External ear normal.  Nose: Nose normal.  Mouth/Throat: Oropharynx is clear and moist.  Eyes: Conjunctivae and EOM are normal.  Cardiovascular: Normal rate, regular rhythm and normal heart sounds.   No murmur heard. Pulmonary/Chest: Effort normal and breath sounds normal. No respiratory distress. She has no wheezes.  Abdominal: Soft.  No suprapubic pain or flank pain  Neurological: She is alert and oriented to person, place, and time.  Skin: Skin is warm. Capillary refill takes less than 2 seconds.  Psychiatric: She has a normal mood and affect. Her behavior is normal. Judgment and  thought content normal.    Assessment and Plan Karie Kirksileen was seen today for follow-up.  Diagnoses and all orders for this visit:  Frequent urination- no uti, will give pyridium -     POCT urinalysis dipstick -     POCT Microscopic Urinalysis ([facility])     Josyah Achor A Creta LevinStallings

## 2016-07-04 NOTE — Patient Instructions (Signed)
     IF you received an x-ray today, you will receive an invoice from [facility]. Please contact [facility] at [phone] with questions or concerns regarding your invoice.   IF you received labwork today, you will receive an invoice from [facility]. Please contact [facility] at [phone] with questions or concerns regarding your invoice.   Our billing staff will not be able to assist you with questions regarding bills from these companies.  You will be contacted with the lab results as soon as they are available. The fastest way to get your results is to activate your My Chart account. Instructions are located on the last page of this paperwork. If you have not heard from us regarding the results in 2 weeks, please contact this office.      

## 2016-07-23 ENCOUNTER — Other Ambulatory Visit: Payer: Self-pay | Admitting: Emergency Medicine

## 2016-07-23 DIAGNOSIS — E038 Other specified hypothyroidism: Secondary | ICD-10-CM

## 2016-08-04 ENCOUNTER — Encounter: Payer: Self-pay | Admitting: Family Medicine

## 2016-08-04 ENCOUNTER — Ambulatory Visit (INDEPENDENT_AMBULATORY_CARE_PROVIDER_SITE_OTHER): Payer: Medicare Other | Admitting: Family Medicine

## 2016-08-04 VITALS — BP 125/71 | HR 89 | Temp 98.7°F | Resp 18 | Ht <= 58 in | Wt 136.2 lb

## 2016-08-04 DIAGNOSIS — Z136 Encounter for screening for cardiovascular disorders: Secondary | ICD-10-CM

## 2016-08-04 DIAGNOSIS — E039 Hypothyroidism, unspecified: Secondary | ICD-10-CM

## 2016-08-04 DIAGNOSIS — Z1383 Encounter for screening for respiratory disorder NEC: Secondary | ICD-10-CM | POA: Diagnosis not present

## 2016-08-04 DIAGNOSIS — R32 Unspecified urinary incontinence: Secondary | ICD-10-CM | POA: Diagnosis not present

## 2016-08-04 DIAGNOSIS — I10 Essential (primary) hypertension: Secondary | ICD-10-CM

## 2016-08-04 DIAGNOSIS — I679 Cerebrovascular disease, unspecified: Secondary | ICD-10-CM | POA: Diagnosis not present

## 2016-08-04 DIAGNOSIS — Z1389 Encounter for screening for other disorder: Secondary | ICD-10-CM

## 2016-08-04 DIAGNOSIS — G4700 Insomnia, unspecified: Secondary | ICD-10-CM | POA: Diagnosis not present

## 2016-08-04 DIAGNOSIS — Z5181 Encounter for therapeutic drug level monitoring: Secondary | ICD-10-CM | POA: Diagnosis not present

## 2016-08-04 DIAGNOSIS — R350 Frequency of micturition: Secondary | ICD-10-CM

## 2016-08-04 DIAGNOSIS — Z0000 Encounter for general adult medical examination without abnormal findings: Secondary | ICD-10-CM | POA: Diagnosis not present

## 2016-08-04 DIAGNOSIS — D72819 Decreased white blood cell count, unspecified: Secondary | ICD-10-CM | POA: Diagnosis not present

## 2016-08-04 LAB — POCT URINALYSIS DIP (MANUAL ENTRY)
BILIRUBIN UA: NEGATIVE
GLUCOSE UA: NEGATIVE
Ketones, POC UA: NEGATIVE
Nitrite, UA: NEGATIVE
Protein Ur, POC: NEGATIVE
SPEC GRAV UA: 1.02
Urobilinogen, UA: 0.2
pH, UA: 5.5

## 2016-08-04 MED ORDER — MIRABEGRON ER 25 MG PO TB24: 25.0000 mg | ORAL_TABLET | Freq: Every day | ORAL | 0 refills | Status: DC

## 2016-08-04 MED ORDER — LEVOTHYROXINE SODIUM 50 MCG PO TABS: 50.0000 ug | ORAL_TABLET | Freq: Every day | ORAL | 0 refills | Status: DC

## 2016-08-04 NOTE — Patient Instructions (Signed)
     IF you received an x-ray today, you will receive an invoice from [facility]. Please contact  Radiology at [phone] with questions or concerns regarding your invoice.   IF you received labwork today, you will receive an invoice from [facility]. Please contact [facility] at [phone] with questions or concerns regarding your invoice.   Our billing staff will not be able to assist you with questions regarding bills from these companies.  You will be contacted with the lab results as soon as they are available. The fastest way to get your results is to activate your My Chart account. Instructions are located on the last page of this paperwork. If you have not heard from us regarding the results in 2 weeks, please contact this office.     

## 2016-08-04 NOTE — Progress Notes (Signed)
Subjective:   Chief Complaint  Patient presents with  . Annual Exam    does not know/recall all the medications she is currently taking.      Brianna Reynolds is a 83 y.o. female who presents for Medicare Annual/Subsequent preventive examination. Chief Complaint  Patient presents with  . Annual Exam    does not know/recall all the medications she is currently taking.    Preventive Screening-Counseling & Management  Tobacco History  Smoking Status  . Former Smoker  . Types: Cigarettes  . Quit date: 02/10/1991  Smokeless Tobacco  . Never Used     Problems Prior to Visit 1. She has a row of day pills on her left which she takes every morning and then the 2 night pills.  Mood fine. She has always struggled with some insomnia.   [name] 2 sons here, 1 son in the [place].  Drives to store or church. Mother with severe arthritisl  Does not like w/ater w/o stretch. Urinates sev times/d, drinks sprite, good appetite   Looks like Dr. Hyacinth Meeker tried to get her of the klonopin but she wasn't able to come off.  He is rxing the aricept. She is on syntroid, zoloft, and sweroquest and states her mood is good. Apparently in Sept she was having some anx/depression  Current Problems (verified) Patient Active Problem List   Diagnosis Date Noted  . Amnesia 01/08/2015  . Cerebrovascular small vessel disease 01/08/2015  . Idiopathic peripheral neuropathy 01/08/2015  . Atherosclerotic cerebrovascular disease 01/08/2015  . Clostridium difficile diarrhea   . Cough   . Recurrent Clostridium difficile diarrhea 06/13/2014  . Leukocytosis 06/13/2014  . Dehydration 06/13/2014  . Blood poisoning (HCC)   . Hypothyroid   . Insomnia 02/28/2013  . Abnormal serum protein electrophoresis 02/11/2013  . Abnormal EKG   . HTN (hypertension)   . Fatigue     Medications Prior to Visit Current Outpatient Prescriptions on File Prior to Visit  Medication Sig Dispense  Refill  . acetaminophen (TYLENOL) 325 MG tablet Take 650 mg by mouth every 6 (six) hours as needed for headache (headache).    Marland Kitchen aspirin EC 81 MG tablet Take 81 mg by mouth daily.    Marland Kitchen atorvastatin (LIPITOR) 10 MG tablet One tablet daily 30 tablet 11  . bifidobacterium infantis (ALIGN) capsule Take 1 capsule by mouth daily. 30 capsule 12  . Calcium Carbonate (CALTRATE 600 PO) Take 1 tablet by mouth daily.    . Cholecalciferol (VITAMIN D) 2000 UNITS CAPS Take 1 capsule by mouth daily.     . clonazePAM (KLONOPIN) 0.5 MG tablet TAKE 1 TABLET THREE TIMES DAILY AS NEEDED FOR ANXIETY. 30 tablet 0  . donepezil (ARICEPT) 10 MG tablet Take 10 mg by mouth.    . Melatonin 5 MG TABS Take 5 mg by mouth at bedtime.     . Multiple Vitamins-Minerals (PRESERVISION AREDS) CAPS Take 1 capsule by mouth daily.    . QUEtiapine (SEROQUEL) 25 MG tablet TAKE 1 TABLET AT BEDTIME AS NEEDED FOR INSOMNIA, MAY REPEAT IN 1-2 HOURS IF NEEDED 60 tablet 11  . sertraline (ZOLOFT) 50 MG tablet TAKE 1&1/2 TABLETS ONCE DAILY. 45 tablet 1  . SYNTHROID 50 MCG tablet TAKE 1 TABLET IN THE MORNING BEFORE BREAKFAST. 30 tablet 0   No current facility-administered medications on file prior to visit.     Current Medications (verified) Current Outpatient Prescriptions  Medication Sig Dispense Refill  . acetaminophen (TYLENOL) 325 MG  tablet Take 650 mg by mouth every 6 (six) hours as needed for headache (headache).    Marland Kitchen aspirin EC 81 MG tablet Take 81 mg by mouth daily.    Marland Kitchen atorvastatin (LIPITOR) 10 MG tablet One tablet daily 30 tablet 11  . bifidobacterium infantis (ALIGN) capsule Take 1 capsule by mouth daily. 30 capsule 12  . Calcium Carbonate (CALTRATE 600 PO) Take 1 tablet by mouth daily.    . Cholecalciferol (VITAMIN D) 2000 UNITS CAPS Take 1 capsule by mouth daily.     . clonazePAM (KLONOPIN) 0.5 MG tablet TAKE 1 TABLET THREE TIMES DAILY AS NEEDED FOR ANXIETY. 30 tablet 0  . donepezil (ARICEPT) 10 MG tablet Take 10 mg by mouth.     . Melatonin 5 MG TABS Take 5 mg by mouth at bedtime.     . Multiple Vitamins-Minerals (PRESERVISION AREDS) CAPS Take 1 capsule by mouth daily.    . QUEtiapine (SEROQUEL) 25 MG tablet TAKE 1 TABLET AT BEDTIME AS NEEDED FOR INSOMNIA, MAY REPEAT IN 1-2 HOURS IF NEEDED 60 tablet 11  . sertraline (ZOLOFT) 50 MG tablet TAKE 1&1/2 TABLETS ONCE DAILY. 45 tablet 1  . SYNTHROID 50 MCG tablet TAKE 1 TABLET IN THE MORNING BEFORE BREAKFAST. 30 tablet 0   No current facility-administered medications for this visit.      Allergies (verified)  Penicillins   PAST HISTORY  Family History Family History  Problem Relation Age of Onset  . Arthritis Mother     Social History Social History  Substance Use Topics  . Smoking status: Former Smoker    Types: Cigarettes    Quit date: 02/10/1991  . Smokeless tobacco: Never Used  . Alcohol use No     Comment: rarely     Are there smokers in your home (other than you)? No  Pt lives with husband who is also doing well.  She doesn't have any siblings but she does have 5 children who all lives in [place] other than one in [place] but none in [place]  Risk Factors Current exercise habits: The patient does not participate in regular exercise at present.  Dietary issues discussed: Eats whatever she wants.  Cardiac risk factors: advanced age (older than 4 for men, 109 for women) and sedentary lifestyle.  Depression Screen Depression screen [facility] 2/9 08/04/2016 07/04/2016 01/20/2016 07/21/2015 06/12/2015  Decreased Interest 0 0 0 0 0  Down, Depressed, Hopeless 0 0 0 0 0  PHQ - 2 Score 0 0 0 0 0    Activities of Daily Living In your present state of health, do you have any difficulty performing the following activities?:  Driving? Yes Managing money?  Yes Feeding yourself? No Getting from bed to chair? No Climbing a flight of stairs? No Preparing food and eating?: No Bathing or showering? No - step over shower with grabbars Getting dressed: No Getting to the  toilet? No Using the toilet:No Moving around from place to place: No In the past year have you fallen or had a near fall?:No   Are you sexually active?  No  Do you have more than one partner?  No  Hearing Difficulties: No Do you often ask people to speak up or repeat themselves? No Do you experience ringing or noises in your ears? No Do you have difficulty understanding soft or whispered voices? No   Do you feel that you have a problem with memory? Yes  Do you often misplace items? No  Do you feel safe at home?  Yes  Cognitive Testing  Alert? Yes  Normal Appearance?Yes  Oriented to person? Yes  Place? No   Time? No  Recall of three objects?  No  Can perform simple calculations? No  Displays appropriate judgment?No  Can read the correct time from a watch face?No   Advanced Directives have been discussed with the patient? Yes - eldest daughter lives in [place], no   List the Names of Other Physician/Practitioners you currently use: 1.    Indicate any recent Medical Services you may have received from other than [facility] providers in the past year (date may be approximate).  Immunization History  Administered Date(s) Administered  . Influenza,inj,Quad PF,36+ Mos 05/13/2013, 05/13/2014, 06/12/2015, 05/25/2016  . Pneumococcal Conjugate-13 03/11/2014  . Tdap 06/12/2015    Screening Tests Health Maintenance  Topic Date Due  . DEXA SCAN  05/24/1991  . PNA vac Low Risk Adult (2 of 2 - PPSV23) 03/12/2015  . MAMMOGRAM  03/30/2017  . TETANUS/TDAP  06/11/2025  . INFLUENZA VACCINE  Completed  . ZOSTAVAX  Completed    All answers were reviewed with the patient and necessary referrals were made:  SHAW,EVA, MD   08/04/2016   History reviewed: allergies, current medications, past family history, past medical history, past social history, past surgical history and problem list  Review of Systems A comprehensive review of systems was negative.    Objective:    Visual Acuity  Screening   Right eye Left eye Both eyes  Without correction: 20/20 20/15-1 20/15-2  With correction:       Body mass index is 28.47 kg/m. BP 125/71   Pulse 89   Temp 98.7 F (37.1 C) (Oral)   Resp 18   Ht 4\' 10"  (1.473 m)   Wt 136 lb 3.2 oz (61.8 kg)   SpO2 95%   BMI 28.47 kg/m   BP 125/71   Pulse 89   Temp 98.7 F (37.1 C) (Oral)   Resp 18   Ht 4\' 10"  (1.473 m)   Wt 136 lb 3.2 oz (61.8 kg)   SpO2 95%   BMI 28.47 kg/m   General Appearance:    Alert, cooperative, no distress, appears stated age  Head:    Normocephalic, without obvious abnormality, atraumatic  Eyes:    PERRL, conjunctiva/corneas clear, EOM's intact, both eyes  Ears:    Normal TM's and external ear canals, both ears  Nose:   Nares normal, septum midline, mucosa normal, no drainage    or sinus tenderness  Throat:   Lips, mucosa, and tongue normal; teeth and gums normal  Neck:   Supple, symmetrical, trachea midline, no adenopathy;    thyroid:  no enlargement/tenderness/nodules; no carotid   bruit or JVD  Back:     Symmetric, no curvature, ROM normal, no CVA tenderness  Lungs:     Clear to auscultation bilaterally, respirations unlabored  Chest Wall:    No tenderness or deformity   Heart:    Regular rate and rhythm, S1 and S2 normal, no murmur, rub   or gallop              Extremities:   Extremities normal, atraumatic, no cyanosis or edema  Pulses:   2+ and symmetric all extremities  Skin:   Skin color, texture, turgor normal, no rashes or lesions  Lymph nodes:   Cervical, supraclavicular, and axillary nodes normal  Neurologic:   CNII-XII intact, normal strength, sensation and reflexes    throughout  Assessment:     Urinary incontinence, unspecified type - Plan: Urinalysis, microscopic only  Medicare annual wellness visit, subsequent - Plan: POCT urinalysis dipstick  Screening for cardiovascular, respiratory, and genitourinary diseases - Plan: POCT urinalysis dipstick  Essential  hypertension - Plan: Comprehensive metabolic panel  Cerebrovascular small vessel disease  Hypothyroidism, unspecified type - Plan: TSH  Insomnia, unspecified type  Urinary frequency - Plan: CBC, Urine culture  Leukopenia, unspecified type - Plan: CBC  Medication monitoring encounter - Plan: Comprehensive metabolic panel, TSH       Plan:     During the course of the visit the patient was educated and counseled about appropriate screening and preventive services including:    See health maintanence  Diet review for nutrition referral? Yes ____  Not Indicated __x__   Patient Instructions (the written plan) was given to the patient.  Medicare Attestation I have personally reviewed: The patient's medical and social history Their use of alcohol, tobacco or illicit drugs Their current medications and supplements The patient's functional ability including ADLs,fall risks, home safety risks, cognitive, and hearing and visual impairment Diet and physical activities Evidence for depression or mood disorders  The patient's weight, height, BMI, and visual acuity have been recorded in the chart.  I have made referrals, counseling, and provided education to the patient based on review of the above and I have provided the patient with a written personalized care plan for preventive services.     Norberto Sorenson, MD   08/04/2016    Orders Placed This Encounter  Procedures  . Urine culture  . Comprehensive metabolic panel  . TSH  . CBC  . Urinalysis, microscopic only  . POCT urinalysis dipstick    Meds ordered this encounter  Medications  . mirabegron ER (MYRBETRIQ) 25 MG TB24 tablet    Sig: Take 1 tablet (25 mg total) by mouth daily.    Dispense:  30 tablet    Refill:  0  . levothyroxine (SYNTHROID, LEVOTHROID) 50 MCG tablet    Sig: Take 1 tablet (50 mcg total) by mouth daily before breakfast.    Dispense:  30 tablet    Refill:  0    Wants to try changing to generic     Norberto Sorenson, M.D.  [facility] [address] [place], [place] [address] [phone] phone [phone] fax  08/09/16 3:37 PM

## 2016-08-05 LAB — URINALYSIS, MICROSCOPIC ONLY: Casts: NONE SEEN /lpf

## 2016-08-05 LAB — COMPREHENSIVE METABOLIC PANEL
ALK PHOS: 89 IU/L (ref 39–117)
ALT: 11 IU/L (ref 0–32)
AST: 16 IU/L (ref 0–40)
Albumin/Globulin Ratio: 1.6 (ref 1.2–2.2)
Albumin: 4.4 g/dL (ref 3.2–4.6)
BILIRUBIN TOTAL: 0.5 mg/dL (ref 0.0–1.2)
BUN/Creatinine Ratio: 15 (ref 12–28)
BUN: 12 mg/dL (ref 10–36)
CHLORIDE: 97 mmol/L (ref 96–106)
CO2: 21 mmol/L (ref 18–29)
CREATININE: 0.8 mg/dL (ref 0.57–1.00)
Calcium: 10.1 mg/dL (ref 8.7–10.3)
GFR calc Af Amer: 75 mL/min/{1.73_m2} (ref >59)
GFR calc non Af Amer: 65 mL/min/{1.73_m2} (ref >59)
GLUCOSE: 93 mg/dL (ref 65–99)
Globulin, Total: 2.7 g/dL (ref 1.5–4.5)
Potassium: 4.3 mmol/L (ref 3.5–5.2)
Sodium: 138 mmol/L (ref 134–144)
Total Protein: 7.1 g/dL (ref 6.0–8.5)

## 2016-08-05 LAB — CBC
HEMATOCRIT: 44 % (ref 34.0–46.6)
HEMOGLOBIN: 14.6 g/dL (ref 11.1–15.9)
MCH: 29.4 pg (ref 26.6–33.0)
MCHC: 33.2 g/dL (ref 31.5–35.7)
MCV: 89 fL (ref 79–97)
Platelets: 323 10*3/uL (ref 150–379)
RBC: 4.96 x10E6/uL (ref 3.77–5.28)
RDW: 13.9 % (ref 12.3–15.4)
WBC: 11.7 10*3/uL — AB (ref 3.4–10.8)

## 2016-08-05 LAB — TSH: TSH: 6.07 u[IU]/mL — AB (ref 0.450–4.500)

## 2016-08-06 ENCOUNTER — Telehealth: Payer: Self-pay

## 2016-08-06 LAB — URINE CULTURE

## 2016-08-06 NOTE — Telephone Encounter (Signed)
Pharmacy called myrbetriq not covered  167.00  detrol la or ditropan xl generics are, please change and send if appropriate .

## 2016-08-09 MED ORDER — LEVOTHYROXINE SODIUM 75 MCG PO TABS: 75.0000 ug | ORAL_TABLET | Freq: Every day | ORAL | 2 refills | Status: DC

## 2016-08-10 NOTE — Telephone Encounter (Signed)
Called in Detrol ER 2mg  QD #30 in place of Myrbetriq which patient states was to expensive per Dr. Tyson Reynolds's order. [facility]. Pt's husband Brianna Reynolds has been informed of new prescription.

## 2016-08-16 NOTE — Telephone Encounter (Signed)
Husband states she is the same mentally, but urinating less 2-3 times per day instead of 4-5 times.  Here appointment with neurologist is postponed til march

## 2016-08-16 NOTE — Telephone Encounter (Signed)
Pt's dementia had severely worsened at her last visit.  I spoke with her neurologist's office who has going to work her in for quicker follow-up so was intentionally holding off on starting a new med for urinary frequency and incontinence such as Detrol as it can cause more confusion and agitation in dementia patients. This is a VERY low dose and so will likely be fine but my intent was to NOT start a new med that may make things worse until after her neurology eval as I did not want to have a med impairing her baseline.    Please call husband Leonette MostCharles and see if it has helped. If it is helping and he is not noting any increased confusion or agitation ok to continue but let him know that it is a potential side effect though I think at this low dose it is safe to try Detrol but we will want neurology input and follow-up prior to continuing.  Also, please add Detrol rx to the med list so neurology knows she's on it - I do not see it on there.  Thanks.

## 2016-08-16 NOTE — Telephone Encounter (Signed)
Wonderful. Thank you so much.  Will continue on same med and ok to refill Detrol ER 2 mg x a 3 mo supply when requested

## 2016-08-23 ENCOUNTER — Other Ambulatory Visit: Payer: Self-pay | Admitting: Emergency Medicine

## 2016-08-24 NOTE — Telephone Encounter (Signed)
Called pharmacy and refilled for additional 5, called so can be delivered today at 11am.

## 2016-09-06 ENCOUNTER — Other Ambulatory Visit: Payer: Self-pay | Admitting: Family Medicine

## 2016-09-08 ENCOUNTER — Other Ambulatory Visit: Payer: Self-pay | Admitting: Family Medicine

## 2016-09-19 ENCOUNTER — Other Ambulatory Visit: Payer: Self-pay | Admitting: Emergency Medicine

## 2016-09-19 NOTE — Telephone Encounter (Signed)
03/2016 last rx

## 2016-09-20 NOTE — Telephone Encounter (Signed)
Called to pharmacy 

## 2016-10-05 ENCOUNTER — Other Ambulatory Visit: Payer: Self-pay | Admitting: Family Medicine

## 2016-10-12 DIAGNOSIS — R938 Abnormal findings on diagnostic imaging of other specified body structures: Secondary | ICD-10-CM | POA: Diagnosis not present

## 2016-10-12 DIAGNOSIS — I672 Cerebral atherosclerosis: Secondary | ICD-10-CM | POA: Diagnosis not present

## 2016-10-12 DIAGNOSIS — R413 Other amnesia: Secondary | ICD-10-CM | POA: Diagnosis not present

## 2016-10-12 DIAGNOSIS — I679 Cerebrovascular disease, unspecified: Secondary | ICD-10-CM | POA: Diagnosis not present

## 2016-10-12 DIAGNOSIS — F411 Generalized anxiety disorder: Secondary | ICD-10-CM | POA: Diagnosis not present

## 2016-10-12 DIAGNOSIS — G609 Hereditary and idiopathic neuropathy, unspecified: Secondary | ICD-10-CM | POA: Diagnosis not present

## 2016-11-08 ENCOUNTER — Other Ambulatory Visit: Payer: Self-pay | Admitting: Family Medicine

## 2016-12-12 ENCOUNTER — Other Ambulatory Visit: Payer: Self-pay | Admitting: Family Medicine

## 2016-12-13 ENCOUNTER — Telehealth: Payer: Self-pay | Admitting: Family Medicine

## 2016-12-13 ENCOUNTER — Other Ambulatory Visit: Payer: Self-pay | Admitting: *Deleted

## 2016-12-13 DIAGNOSIS — E039 Hypothyroidism, unspecified: Secondary | ICD-10-CM

## 2016-12-13 MED ORDER — LEVOTHYROXINE SODIUM 75 MCG PO TABS: 75.0000 ug | ORAL_TABLET | Freq: Every day | ORAL | 0 refills | Status: DC

## 2016-12-13 NOTE — Telephone Encounter (Signed)
LMOM TO CALL AND SCHEDULE AN OV WITH SHAW FOR LABS AND MED REFILL LONG OVER DUE FOR AN OV TO SEE HOW MEDICINE IS DOING FOR HER

## 2016-12-13 NOTE — Telephone Encounter (Signed)
We changed pt's levothyroxine dose after her last visit 4 mos ago so she needs an OV for recheck before any further refills. (had initially offered lab only visit for this 4 mos ago but as Ms. Brianna Reynolds is a relatively new pt to myself I would like to see her in an OV).

## 2016-12-15 ENCOUNTER — Ambulatory Visit (INDEPENDENT_AMBULATORY_CARE_PROVIDER_SITE_OTHER): Payer: Medicare Other | Admitting: Family Medicine

## 2016-12-15 ENCOUNTER — Encounter: Payer: Self-pay | Admitting: Family Medicine

## 2016-12-15 VITALS — BP 120/71 | HR 103 | Temp 98.2°F | Resp 16 | Ht 58.07 in | Wt 128.0 lb

## 2016-12-15 DIAGNOSIS — E039 Hypothyroidism, unspecified: Secondary | ICD-10-CM

## 2016-12-15 DIAGNOSIS — R3121 Asymptomatic microscopic hematuria: Secondary | ICD-10-CM

## 2016-12-15 DIAGNOSIS — R350 Frequency of micturition: Secondary | ICD-10-CM | POA: Diagnosis not present

## 2016-12-15 LAB — POCT URINALYSIS DIP (MANUAL ENTRY)
BILIRUBIN UA: NEGATIVE
Glucose, UA: NEGATIVE mg/dL
Leukocytes, UA: NEGATIVE
Nitrite, UA: NEGATIVE
PH UA: 5.5 (ref 5.0–8.0)
PROTEIN UA: NEGATIVE mg/dL
SPEC GRAV UA: 1.025 (ref 1.010–1.025)
Urobilinogen, UA: 0.2 E.U./dL

## 2016-12-15 MED ORDER — TOLTERODINE TARTRATE ER 4 MG PO CP24: ORAL_CAPSULE | ORAL | 1 refills | Status: DC

## 2016-12-15 NOTE — Patient Instructions (Addendum)
I have increased the dose of your Detrol to 4 mg a day. You may take 2 tabs of the current until you run out. Lets see if that will help even more.  It looks like you may have some kidney stones - as long as they are not bothering you, we do not need to do anything about them other than trying to drink more water so that you do not form them (we do not want one to get big enough that it would cause you pain.) Your urine today does look like you are a little dehydrated.   I will let you know if we have to change your thyroid dose after the test comes back - if it looks good, we will sent you a letter in the mail which can take up to 2 weeks.    IF you received an x-ray today, you will receive an invoice from [facility]. Please contact [facility] at [phone] with questions or concerns regarding your invoice.   IF you received labwork today, you will receive an invoice from [place]. Please contact [facility] at [phone] with questions or concerns regarding your invoice.   Our billing staff will not be able to assist you with questions regarding bills from these companies.  You will be contacted with the lab results as soon as they are available. The fastest way to get your results is to activate your My Chart account. Instructions are located on the last page of this paperwork. If you have not heard from [place] regarding the results in 2 weeks, please contact this office.     Dietary Guidelines to Help Prevent Kidney Stones Kidney stones are deposits of minerals and salts that form inside your kidneys. Your risk of developing kidney stones may be greater depending on your diet, your lifestyle, the medicines you take, and whether you have certain medical conditions. Most people can reduce their chances of developing kidney stones by following the instructions below. Depending on your overall health and the type of kidney stones you tend to develop, your dietitian may give you  more specific instructions. What are tips for following this plan? Reading food labels   Choose foods with "no salt added" or "low-salt" labels. Limit your sodium intake to less than 1500 mg per day.  Choose foods with calcium for each meal and snack. Try to eat about 300 mg of calcium at each meal. Foods that contain 200-500 mg of calcium per serving include:  8 oz (237 ml) of milk, fortified nondairy milk, and fortified fruit juice.  8 oz (237 ml) of kefir, yogurt, and soy yogurt.  4 oz (118 ml) of tofu.  1 oz of cheese.  1 cup (300 g) of dried figs.  1 cup (91 g) of cooked broccoli.  1-3 oz can of sardines or mackerel.  Most people need 1000 to 1500 mg of calcium each day. Talk to your dietitian about how much calcium is recommended for you. Shopping   Buy plenty of fresh fruits and vegetables. Most people do not need to avoid fruits and vegetables, even if they contain nutrients that may contribute to kidney stones.  When shopping for convenience foods, choose:  Whole pieces of fruit.  Premade salads with dressing on the side.  Low-fat fruit and yogurt smoothies.  Avoid buying frozen meals or prepared deli foods.  Look for foods with live cultures, such as yogurt and kefir. Cooking   Do not add salt to food when cooking. Place  a salt shaker on the table and allow each person to add his or her own salt to taste.  Use vegetable protein, such as beans, textured vegetable protein (TVP), or tofu instead of meat in pasta, casseroles, and soups. Meal planning   Eat less salt, if told by your dietitian. To do this:  Avoid eating processed or premade food.  Avoid eating fast food.  Eat less animal protein, including cheese, meat, poultry, or fish, if told by your dietitian. To do this:  Limit the number of times you have meat, poultry, fish, or cheese each week. Eat a diet free of meat at least 2 days a week.  Eat only one serving each day of meat, poultry, fish, or  seafood.  When you prepare animal protein, cut pieces into small portion sizes. For most meat and fish, one serving is about the size of one deck of cards.  Eat at least 5 servings of fresh fruits and vegetables each day. To do this:  Keep fruits and vegetables on hand for snacks.  Eat 1 piece of fruit or a handful of berries with breakfast.  Have a salad and fruit at lunch.  Have two kinds of vegetables at dinner.  Limit foods that are high in a substance called oxalate. These include:  Spinach.  Rhubarb.  Beets.  Potato chips and french fries.  Nuts.  If you regularly take a diuretic medicine, make sure to eat at least 1-2 fruits or vegetables high in potassium each day. These include:  Avocado.  Banana.  Orange, prune, carrot, or tomato juice.  Baked potato.  Cabbage.  Beans and split peas. General instructions   Drink enough fluid to keep your urine clear or pale yellow. This is the most important thing you can do.  Talk to your health care provider and dietitian about taking daily supplements. Depending on your health and the cause of your kidney stones, you may be advised:  Not to take supplements with vitamin C.  To take a calcium supplement.  To take a daily probiotic supplement.  To take other supplements such as magnesium, fish oil, or vitamin B6.  Take all medicines and supplements as told by your health care provider.  Limit alcohol intake to no more than 1 drink a day for nonpregnant women and 2 drinks a day for men. One drink equals 12 oz of beer, 5 oz of wine, or 1 oz of hard liquor.  Lose weight if told by your health care provider. Work with your dietitian to find strategies and an eating plan that works best for you. What foods are not recommended? Limit your intake of the following foods, or as told by your dietitian. Talk to your dietitian about specific foods you should avoid based on the type of kidney stones and your overall  health. Grains  Breads. Bagels. Rolls. Baked goods. Salted crackers. Cereal. Pasta. Vegetables  Spinach. Rhubarb. Beets. Canned vegetables. Rosita FirePickles. Olives. Meats and other protein foods  Nuts. Nut butters. Large portions of meat, poultry, or fish. Salted or cured meats. Deli meats. Hot dogs. Sausages. Dairy  Cheese. Beverages  Regular soft drinks. Regular vegetable juice. Seasonings and other foods  Seasoning blends with salt. Salad dressings. Canned soups. Soy sauce. Ketchup. Barbecue sauce. Canned pasta sauce. Casseroles. Pizza. Lasagna. Frozen meals. Potato chips. [place] fries. Summary  You can reduce your risk of kidney stones by making changes to your diet.  The most important thing you can do is drink enough fluid.  You should drink enough fluid to keep your urine clear or pale yellow.  Ask your health care provider or dietitian how much protein from animal sources you should eat each day, and also how much salt and calcium you should have each day. This information is not intended to replace advice given to you by your health care provider. Make sure you discuss any questions you have with your health care provider. Document Released: 11/12/2010 Document Revised: 06/28/2016 Document Reviewed: 06/28/2016 Elsevier Interactive Patient Education  2017 [employer] Inc.   Hematuria, Adult Hematuria is blood in your urine. It can be caused by a bladder infection, kidney infection, prostate infection, kidney stone, or cancer of your urinary tract. Infections can usually be treated with medicine, and a kidney stone usually will pass through your urine. If neither of these is the cause of your hematuria, further workup to find out the reason may be needed. It is very important that you tell your health care provider about any blood you see in your urine, even if the blood stops without treatment or happens without causing pain. Blood in your urine that happens and then stops and then happens  again can be a symptom of a very serious condition. Also, pain is not a symptom in the initial stages of many urinary cancers. Follow these instructions at home:  Drink lots of fluid, 3-4 quarts a day. If you have been diagnosed with an infection, cranberry juice is especially recommended, in addition to large amounts of water.  Avoid caffeine, tea, and carbonated beverages because they tend to irritate the bladder.  Avoid alcohol because it may irritate the prostate.  Take all medicines as directed by your health care provider.  If you were prescribed an antibiotic medicine, finish it all even if you start to feel better.  If you have been diagnosed with a kidney stone, follow your health care provider's instructions regarding straining your urine to catch the stone.  Empty your bladder often. Avoid holding urine for long periods of time.  After a bowel movement, women should cleanse front to back. Use each tissue only once.  Empty your bladder before and after sexual intercourse if you are a female. Contact a health care provider if:  You develop back pain.  You have a fever.  You have a feeling of sickness in your stomach (nausea) or vomiting.  Your symptoms are not better in 3 days. Return sooner if you are getting worse. Get help right away if:  You develop severe vomiting and are unable to keep the medicine down.  You develop severe back or abdominal pain despite taking your medicines.  You begin passing a large amount of blood or clots in your urine.  You feel extremely weak or faint, or you pass out. This information is not intended to replace advice given to you by your health care provider. Make sure you discuss any questions you have with your health care provider. Document Released: 07/18/2005 Document Revised: 12/24/2015 Document Reviewed: 03/18/2013 Elsevier Interactive Patient Education  2017 [employer].

## 2016-12-15 NOTE — Progress Notes (Signed)
Subjective:    Patient ID: Brianna Reynolds, female    DOB: 02/10/1924, 83 y.o.   MRN: [id number]  Chief Complaint  Patient presents with  . Follow-up    labs,med refill    HPI  Mr. Brianna Reynolds is a 83 year old woman here for a recheck of her hypothyroidism. 4 months prior her TSH returned at 6.07 so her levothyroxine was increased from 50-75 g and she is here for recheck to ensure she is on the correct dose.  Hematuria: She had calcium oxalate crystals in her urine and her last check.  Urinary urgency/urge incontinence:  At visit 4 months prior patient was started on Detrol ER 2 mg which did decrease her urinary frequency by one half initially but over the last several months it has worsened again. Pt admits to urinary frequency and urgency but denies incontinence "because I"m fast."  Dementia: Patient had follow-up with her neurologist Dr. Hyacinth Reynolds 2 months ago. She reported that she was no longer using clonazepam at that time. She is maintained on Seroquel, sertraline, and Aricept. No significant overall change was seen and medications were unchanged. She is seen every 6 months.  Past Medical History:  Diagnosis Date  . Abnormal EKG   . Anxiety   . C. difficile colitis   . Cataract   . Fatigue   . HTN (hypertension)    New  . Hyperlipidemia   . Hypothyroid   . Pneumonia    Past Surgical History:  Procedure Laterality Date  . BREAST SURGERY Bilateral   . COLONOSCOPY    . COLONOSCOPY N/A 10/27/2014   Procedure: COLONOSCOPY;  Surgeon: Brianna Booparl E Gessner, MD;  [facility];  Service: Endoscopy;  Laterality: N/A;  . PARATHYROIDECTOMY    . THYROIDECTOMY, PARTIAL    . TONSILLECTOMY     Current Outpatient Prescriptions on File Prior to Visit  Medication Sig Dispense Refill  . acetaminophen (TYLENOL) 325 MG tablet Take 650 mg by mouth every 6 (six) hours as needed for headache (headache).    Marland Kitchen. aspirin EC 81 MG tablet Take 81 mg by mouth daily.    Marland Kitchen. atorvastatin (LIPITOR)  10 MG tablet TAKE 1 TABLET EACH DAY. 30 tablet 5  . bifidobacterium infantis (ALIGN) capsule Take 1 capsule by mouth daily. 30 capsule 12  . Calcium Carbonate (CALTRATE 600 PO) Take 1 tablet by mouth daily.    . Cholecalciferol (VITAMIN D) 2000 UNITS CAPS Take 1 capsule by mouth daily.     . clonazePAM (KLONOPIN) 0.5 MG tablet TAKE 1 TABLET THREE TIMES DAILY AS NEEDED FOR ANXIETY. 30 tablet 0  . donepezil (ARICEPT) 10 MG tablet Take 10 mg by mouth.    . levothyroxine (SYNTHROID, LEVOTHROID) 75 MCG tablet Take 1 tablet (75 mcg total) by mouth daily before breakfast. Need office visit for any further refills. 30 tablet 0  . Melatonin 5 MG TABS Take 5 mg by mouth at bedtime.     . Multiple Vitamins-Minerals (PRESERVISION AREDS) CAPS Take 1 capsule by mouth daily.    . QUEtiapine (SEROQUEL) 25 MG tablet TAKE 1 TABLET AT BEDTIME AS NEEDED FOR INSOMNIA, MAY REPEAT IN 1-2 HOURS IF NEEDED 60 tablet 11  . sertraline (ZOLOFT) 50 MG tablet TAKE 1&1/2 TABLETS ONCE DAILY. 45 tablet 1  . tolterodine (DETROL LA) 2 MG 24 hr capsule TAKE (1) CAPSULE DAILY. 90 capsule 0   No current facility-administered medications on file prior to visit.    Allergies  Allergen Reactions  . Penicillins Rash  Family History  Problem Relation Age of Onset  . Arthritis Mother    Social History   Social History  . Marital status: Married    Spouse name: N/A  . Number of children: 5  . Years of education: N/A   Occupational History  . house wife    Social History Main Topics  . Smoking status: Former Smoker    Types: Cigarettes    Quit date: 02/10/1991  . Smokeless tobacco: Never Used  . Alcohol use No     Comment: rarely  . Drug use: No  . Sexual activity: Not Asked   Other Topics Concern  . None   Social History Narrative  . None   Depression screen [facility] 2/9 12/15/2016 08/04/2016 07/04/2016 01/20/2016 07/21/2015  Decreased Interest 0 0 0 0 0  Down, Depressed, Hopeless 0 0 0 0 0  PHQ - 2 Score 0 0 0 0 0     Review of Systems  Constitutional: Negative for activity change, appetite change, chills, fever and unexpected weight change.  Endocrine: Positive for polyuria. Negative for polydipsia and polyphagia.  Genitourinary: Positive for enuresis, frequency and urgency. Negative for decreased urine volume, difficulty urinating, dysuria, flank pain, hematuria and pelvic pain.  Allergic/Immunologic: Negative for immunocompromised state.  Psychiatric/Behavioral: Positive for agitation, behavioral problems, confusion, decreased concentration and sleep disturbance. Negative for dysphoric mood, hallucinations, self-injury and suicidal ideas. The patient is not nervous/anxious and is not hyperactive.        Objective:   Physical Exam  Constitutional: She appears well-developed and well-nourished. No distress.  HENT:  Head: Normocephalic and atraumatic.  Right Ear: External ear normal.  Left Ear: External ear normal.  Eyes: Conjunctivae are normal. No scleral icterus.  Neck: Normal range of motion. Neck supple. No thyromegaly present.  Cardiovascular: Normal rate, regular rhythm, normal heart sounds and intact distal pulses.   Pulmonary/Chest: Effort normal and breath sounds normal. No respiratory distress.  Musculoskeletal: She exhibits no edema.  Lymphadenopathy:    She has no cervical adenopathy.  Neurological: She is alert. She is disoriented.  Slowed gait  Skin: Skin is warm and dry. She is not diaphoretic. No erythema.  Psychiatric: She has a normal mood and affect. Her behavior is normal. Thought content is not paranoid. Cognition and memory are impaired. She expresses impulsivity. She exhibits abnormal recent memory and abnormal remote memory.      BP 120/71   Pulse (!) 103   Temp 98.2 F (36.8 C) (Oral)   Resp 16   Ht 4' 10.07" (1.475 m)   Wt 128 lb (58.1 kg)   SpO2 95%   BMI 26.69 kg/m      Results for orders placed or performed in visit on 12/15/16  POCT urinalysis  dipstick  Result Value Ref Range   Color, UA straw (A) yellow   Clarity, UA cloudy (A) clear   Glucose, UA negative negative mg/dL   Bilirubin, UA negative negative   Ketones, POC UA small (15) (A) negative mg/dL   Spec Grav, UA 1.610 9.604 - 1.025   Blood, UA moderate (A) negative   pH, UA 5.5 5.0 - 8.0   Protein Ur, POC negative negative mg/dL   Urobilinogen, UA 0.2 0.2 or 1.0 E.U./dL   Nitrite, UA Negative Negative   Leukocytes, UA Negative Negative    Assessment & Plan:    Not enough urine to send for clx.   1. Hypothyroidism, unspecified type - tsh at goal, cont levothyroxine 75 mcg. Recheck  in 6 mos  2. Asymptomatic microscopic hematuria - again has calcium oxalate crystals in her urine - I suspect she is getting dehydrated from decreased intake (poss in effort to stem the urinary incontinence) but may need urology eval.  3. Urinary frequency - initially helped ut now sxs recured so try increasing detrol LA from 2 to 4 mg    Orders Placed This Encounter  Procedures  . Urine culture    Standing Status:   Future    Number of Occurrences:   1    Standing Expiration Date:   12/15/2017  . TSH  . Urinalysis, microscopic only    Standing Status:   Future    Number of Occurrences:   1    Standing Expiration Date:   12/15/2017  . POCT urinalysis dipstick  . POCT urinalysis dipstick    Standing Status:   Future    Number of Occurrences:   1    Standing Expiration Date:   12/15/2017    Meds ordered this encounter  Medications  . tolterodine (DETROL LA) 4 MG 24 hr capsule    Sig: TAKE (1) CAPSULE DAILY.    Dispense:  90 capsule    Refill:  1    Norberto Sorenson, M.D.  Primary Care at [facility] [address] [place], [place] [address] [phone] phone [phone] fax  12/17/16 9:36 PM

## 2016-12-16 DIAGNOSIS — R3121 Asymptomatic microscopic hematuria: Secondary | ICD-10-CM | POA: Diagnosis not present

## 2016-12-16 DIAGNOSIS — R350 Frequency of micturition: Secondary | ICD-10-CM | POA: Diagnosis not present

## 2016-12-16 LAB — TSH: TSH: 1.04 u[IU]/mL (ref 0.450–4.500)

## 2016-12-16 LAB — POCT URINALYSIS DIP (MANUAL ENTRY)
Bilirubin, UA: NEGATIVE
Glucose, UA: NEGATIVE mg/dL
Leukocytes, UA: NEGATIVE
Nitrite, UA: NEGATIVE
PROTEIN UA: NEGATIVE mg/dL
RBC UA: NEGATIVE
SPEC GRAV UA: 1.025 (ref 1.010–1.025)
UROBILINOGEN UA: 0.2 U/dL
pH, UA: 5.5 (ref 5.0–8.0)

## 2016-12-17 LAB — URINALYSIS, MICROSCOPIC ONLY: CASTS: NONE SEEN /LPF

## 2016-12-17 LAB — URINE CULTURE

## 2016-12-17 MED ORDER — LEVOTHYROXINE SODIUM 75 MCG PO TABS: 75.0000 ug | ORAL_TABLET | Freq: Every day | ORAL | 1 refills | Status: DC

## 2016-12-21 ENCOUNTER — Emergency Department (HOSPITAL_COMMUNITY): Payer: Medicare Other

## 2016-12-21 ENCOUNTER — Encounter (HOSPITAL_COMMUNITY): Payer: Self-pay | Admitting: Family Medicine

## 2016-12-21 ENCOUNTER — Inpatient Hospital Stay (HOSPITAL_COMMUNITY)
Admission: EM | Admit: 2016-12-21 | Discharge: 2016-12-27 | DRG: 481 | Disposition: A | Discharge status: Skilled Nursing Facility | Payer: Medicare Other | Attending: Family Medicine | Admitting: Family Medicine

## 2016-12-21 DIAGNOSIS — F419 Anxiety disorder, unspecified: Secondary | ICD-10-CM | POA: Diagnosis present

## 2016-12-21 DIAGNOSIS — M25551 Pain in right hip: Secondary | ICD-10-CM | POA: Diagnosis not present

## 2016-12-21 DIAGNOSIS — S72141A Displaced intertrochanteric fracture of right femur, initial encounter for closed fracture: Secondary | ICD-10-CM | POA: Diagnosis present

## 2016-12-21 DIAGNOSIS — K219 Gastro-esophageal reflux disease without esophagitis: Secondary | ICD-10-CM | POA: Diagnosis not present

## 2016-12-21 DIAGNOSIS — D72829 Elevated white blood cell count, unspecified: Secondary | ICD-10-CM | POA: Diagnosis present

## 2016-12-21 DIAGNOSIS — T148XXA Other injury of unspecified body region, initial encounter: Secondary | ICD-10-CM | POA: Diagnosis not present

## 2016-12-21 DIAGNOSIS — D62 Acute posthemorrhagic anemia: Secondary | ICD-10-CM | POA: Diagnosis not present

## 2016-12-21 DIAGNOSIS — S79911A Unspecified injury of right hip, initial encounter: Secondary | ICD-10-CM | POA: Diagnosis not present

## 2016-12-21 DIAGNOSIS — Z87891 Personal history of nicotine dependence: Secondary | ICD-10-CM

## 2016-12-21 DIAGNOSIS — S72001A Fracture of unspecified part of neck of right femur, initial encounter for closed fracture: Secondary | ICD-10-CM

## 2016-12-21 DIAGNOSIS — W06XXXA Fall from bed, initial encounter: Secondary | ICD-10-CM | POA: Diagnosis present

## 2016-12-21 DIAGNOSIS — Z7982 Long term (current) use of aspirin: Secondary | ICD-10-CM

## 2016-12-21 DIAGNOSIS — M79604 Pain in right leg: Secondary | ICD-10-CM | POA: Diagnosis not present

## 2016-12-21 DIAGNOSIS — W19XXXA Unspecified fall, initial encounter: Secondary | ICD-10-CM

## 2016-12-21 DIAGNOSIS — Z880 Allergy status to penicillin: Secondary | ICD-10-CM

## 2016-12-21 DIAGNOSIS — R1310 Dysphagia, unspecified: Secondary | ICD-10-CM | POA: Diagnosis not present

## 2016-12-21 DIAGNOSIS — E785 Hyperlipidemia, unspecified: Secondary | ICD-10-CM | POA: Diagnosis present

## 2016-12-21 DIAGNOSIS — Z79899 Other long term (current) drug therapy: Secondary | ICD-10-CM

## 2016-12-21 DIAGNOSIS — F0390 Unspecified dementia without behavioral disturbance: Secondary | ICD-10-CM | POA: Diagnosis present

## 2016-12-21 DIAGNOSIS — R9431 Abnormal electrocardiogram [ECG] [EKG]: Secondary | ICD-10-CM | POA: Diagnosis not present

## 2016-12-21 DIAGNOSIS — Y92009 Unspecified place in unspecified non-institutional (private) residence as the place of occurrence of the external cause: Secondary | ICD-10-CM

## 2016-12-21 DIAGNOSIS — Z09 Encounter for follow-up examination after completed treatment for conditions other than malignant neoplasm: Secondary | ICD-10-CM

## 2016-12-21 DIAGNOSIS — E039 Hypothyroidism, unspecified: Secondary | ICD-10-CM | POA: Diagnosis present

## 2016-12-21 DIAGNOSIS — I10 Essential (primary) hypertension: Secondary | ICD-10-CM | POA: Diagnosis present

## 2016-12-21 DIAGNOSIS — I517 Cardiomegaly: Secondary | ICD-10-CM | POA: Diagnosis not present

## 2016-12-21 DIAGNOSIS — S72401A Unspecified fracture of lower end of right femur, initial encounter for closed fracture: Secondary | ICD-10-CM | POA: Diagnosis not present

## 2016-12-21 DIAGNOSIS — Z419 Encounter for procedure for purposes other than remedying health state, unspecified: Secondary | ICD-10-CM

## 2016-12-21 DIAGNOSIS — Y92003 Bedroom of unspecified non-institutional (private) residence as the place of occurrence of the external cause: Secondary | ICD-10-CM

## 2016-12-21 HISTORY — DX: Other amnesia: R41.3

## 2016-12-21 MED ORDER — ONDANSETRON HCL 4 MG/2ML IJ SOLN
4.0000 mg | Freq: Four times a day (QID) | INTRAMUSCULAR | Status: DC | PRN
  Administered 2016-12-22: 4 mg via INTRAVENOUS
  Filled 2016-12-21: qty 2

## 2016-12-21 MED ORDER — MORPHINE SULFATE (PF) 2 MG/ML IV SOLN
4.0000 mg | INTRAVENOUS | Status: DC | PRN
  Administered 2016-12-22: 4 mg via INTRAVENOUS
  Filled 2016-12-21: qty 2

## 2016-12-21 NOTE — ED Triage Notes (Signed)
Patient is from home and [facility]. Patient reports she tripped while walking in to her bedroom. Pt is complaining of right femur pain. Pt is unable to straighten her right leg out completely.

## 2016-12-21 NOTE — ED Provider Notes (Signed)
[facility]-EMERGENCY DEPT Provider Note   CSN: [id number] Arrival date & time: 12/21/16  2246  By signing my name below, I, Karren Cobble, attest that this documentation has been prepared under the direction and in the presence of Azalia Bilis, MD. Electronically Signed: Karren Cobble, ED Scribe. 12/21/16. 11:13 PM.  History   Chief Complaint Chief Complaint  Patient presents with  . Fall   The history is provided by the patient. No language interpreter was used.  Fall     HPI Comments: Brianna Reynolds is a 83 y.o. female with a  PMHx of HLD and HTN,  brought in by ambulance, who presents to the Emergency Department complaining of right upper leg pain s/p mechanical fall that occurred PTA. Pt notes she was getting ready bed and after turning off her light she fell. Pt notes majority of her pain is present to the anterior right thigh. She is currently in pain. Denies loss of consciousness or head injury. Not currently on anticoagulants. Denies cardiac hx, COPD, or asthma. Denies neck pain, chest pain, or abdominal pain.   Past Medical History:  Diagnosis Date  . Abnormal EKG   . Anxiety   . C. difficile colitis   . Cataract   . Fatigue   . HTN (hypertension)    New  . Hyperlipidemia   . Hypothyroid   . Memory loss   . Pneumonia     Patient Active Problem List   Diagnosis Date Noted  . Amnesia 01/08/2015  . Cerebrovascular small vessel disease 01/08/2015  . Idiopathic peripheral neuropathy 01/08/2015  . Atherosclerotic cerebrovascular disease 01/08/2015  . Clostridium difficile diarrhea   . Cough   . Recurrent Clostridium difficile diarrhea 06/13/2014  . Leukocytosis 06/13/2014  . Dehydration 06/13/2014  . Blood poisoning   . Hypothyroid   . Insomnia 02/28/2013  . Abnormal serum protein electrophoresis 02/11/2013  . Abnormal EKG   . HTN (hypertension)   . Fatigue     Past Surgical History:  Procedure Laterality Date  . BREAST SURGERY Bilateral   . COLONOSCOPY    .  COLONOSCOPY N/A 10/27/2014   Procedure: COLONOSCOPY;  Surgeon: Iva Boop, MD;  Location: [facility] ENDOSCOPY;  Service: Endoscopy;  Laterality: N/A;  . PARATHYROIDECTOMY    . THYROIDECTOMY, PARTIAL    . TONSILLECTOMY      OB History    No data available       Home Medications    Prior to Admission medications   Medication Sig Start Date End Date Taking? Authorizing Provider  acetaminophen (TYLENOL) 325 MG tablet Take 650 mg by mouth every 6 (six) hours as needed for headache (headache).    [provider]  aspirin EC 81 MG tablet Take 81 mg by mouth daily.    [provider]  atorvastatin (LIPITOR) 10 MG tablet TAKE 1 TABLET EACH DAY. 08/24/16   Trena Platt D, PA  bifidobacterium infantis (ALIGN) capsule Take 1 capsule by mouth daily. 01/20/16   Weber, Dema Severin, PA-C  Calcium Carbonate (CALTRATE 600 PO) Take 1 tablet by mouth daily.    [provider]  Cholecalciferol (VITAMIN D) 2000 UNITS CAPS Take 1 capsule by mouth daily.     [provider]  clonazePAM (KLONOPIN) 0.5 MG tablet TAKE 1 TABLET THREE TIMES DAILY AS NEEDED FOR ANXIETY. 09/20/16   Sherren Mocha, MD  donepezil (ARICEPT) 10 MG tablet Take 10 mg by mouth. 12/04/15   [provider]  levothyroxine (SYNTHROID, LEVOTHROID) 75 MCG tablet  Take 1 tablet (75 mcg total) by mouth daily before breakfast. 12/17/16   Sherren Mocha, MD  Melatonin 5 MG TABS Take 5 mg by mouth at bedtime.     [provider]  Multiple Vitamins-Minerals (PRESERVISION AREDS) CAPS Take 1 capsule by mouth daily.    [provider]  QUEtiapine (SEROQUEL) 25 MG tablet TAKE 1 TABLET AT BEDTIME AS NEEDED FOR INSOMNIA, MAY REPEAT IN 1-2 HOURS IF NEEDED 07/21/15   Collene Gobble, MD  sertraline (ZOLOFT) 50 MG tablet TAKE 1&1/2 TABLETS ONCE DAILY. 08/28/15   Collene Gobble, MD  tolterodine (DETROL LA) 4 MG 24 hr capsule TAKE (1) CAPSULE DAILY. 12/15/16   Sherren Mocha, MD    Family History Family History    Problem Relation Age of Onset  . Arthritis Mother     Social History Social History  Substance Use Topics  . Smoking status: Former Smoker    Types: Cigarettes    Quit date: 02/10/1991  . Smokeless tobacco: Never Used  . Alcohol use No     Comment: rarely     Allergies   Penicillins   Review of Systems Review of Systems  Musculoskeletal: Negative for neck pain.    A complete 10 system review of systems was obtained and all systems are negative except as noted in the HPI and PMH.    Physical Exam Updated Vital Signs BP 121/72 (BP Location: Right Arm)   Pulse 85   Temp 98.1 F (36.7 C) (Oral)   Resp 18   SpO2 95% Comment: RA  Physical Exam  Constitutional: She is oriented to person, place, and time. She appears well-developed and well-nourished. No distress.  HENT:  Head: Normocephalic and atraumatic.  Eyes: EOM are normal.  Neck: Normal range of motion. Neck supple.  No c-spine tenderness.   Cardiovascular: Normal rate, regular rhythm and normal heart sounds.   Pulmonary/Chest: Effort normal and breath sounds normal. She exhibits no tenderness.  Abdominal: Soft. She exhibits no distension. There is no tenderness.  Musculoskeletal: Normal range of motion.  Pain ROM of right hip. Normal dp/pt  pulses in the right foot. Full ROM of left hip, knee, and ankle. Full ROM of bilateral shoulders, elbows, and wrists.   Neurological: She is alert and oriented to person, place, and time.  Skin: Skin is warm and dry.  Psychiatric: She has a normal mood and affect. Judgment normal.  Nursing note and vitals reviewed.    ED Treatments / Results  DIAGNOSTIC STUDIES: Oxygen Saturation is 95% on RA, adequate by my interpretation.   COORDINATION OF CARE: 11:48 PM-Discussed next steps with pt. Pt verbalized understanding and is agreeable with the plan.   Labs (all labs ordered are listed, but only abnormal results are displayed) Labs Reviewed  CBC - Abnormal; Notable  for the following:       Result Value   WBC 15.0 (*)    All other components within normal limits  COMPREHENSIVE METABOLIC PANEL - Abnormal; Notable for the following:    Glucose, Bld 126 (*)    Calcium 8.7 (*)    Total Protein 6.1 (*)    Albumin 3.4 (*)    ALT 9 (*)    GFR calc non Af Amer 57 (*)    All other components within normal limits  PROTIME-INR  TYPE AND SCREEN  ABO/RH    EKG  EKG Interpretation  Date/Time:  Thursday Dec 22 2016 00:01:56 EDT Ventricular Rate:  82 PR Interval:  QRS Duration: 76 QT Interval:  409 QTC Calculation: 478 R Axis:   -7 Text Interpretation:  Sinus rhythm Atrial premature complex Probable left atrial enlargement No significant change was found Confirmed by Dodie Parisi  MD, [place] ([id number]) on 12/22/2016 1:17:35 AM       Radiology Dg Chest 1 View  Result Date: 12/22/2016 CLINICAL DATA:  Fall with hip fracture EXAM: CHEST 1 VIEW COMPARISON:  06/12/2014 FINDINGS: Mild cardiomegaly without edema. Aortic atherosclerosis. No pneumothorax. Minimal basilar atelectasis. IMPRESSION: 1. Minimal basilar atelectasis.  No acute infiltrate or edema 2. Cardiomegaly without overt failure. Electronically Signed   By: Jasmine PangKim  Fujinaga M.D.   On: 12/22/2016 00:08   Dg Hip Unilat  With Pelvis 2-3 Views Right  Result Date: 12/22/2016 CLINICAL DATA:  Fall while walking to the bathroom EXAM: DG HIP (WITH OR WITHOUT PELVIS) 2-3V RIGHT COMPARISON:  CT 05/18/2014 FINDINGS: Right greater than left SI joint arthritis. The pubic symphysis appears intact. Comminuted intertrochanteric fracture with varus angulation and superior migration of the distal femoral fragment. Close to 1/2 shaft diameter of anterior displacement of distal fracture fragment on the lateral view. Displaced lesser trochanteric fracture fragment. IMPRESSION: Comminuted displaced and slightly angulated right intertrochanteric fracture. Electronically Signed   By: Jasmine PangKim  Fujinaga M.D.   On: 12/22/2016 00:06   Dg  Femur Min 2 Views Right  Result Date: 12/22/2016 CLINICAL DATA:  Fall with pain EXAM: RIGHT FEMUR 2 VIEWS COMPARISON:  None. FINDINGS: Pubic symphysis appears intact. Right femoral head projects in joint. Comminuted and displaced proximal right femur fracture. Distal femur is intact. Degenerative changes at the right knee. Vascular calcifications. IMPRESSION: Comminuted and displaced proximal femoral fracture. Distal femur appears intact. Electronically Signed   By: Jasmine PangKim  Fujinaga M.D.   On: 12/22/2016 00:07    Procedures Procedures (including critical care time)  Medications Ordered in ED Medications  morphine 2 MG/ML injection 4 mg (4 mg Intravenous Given 12/22/16 0018)  ondansetron (ZOFRAN) injection 4 mg (4 mg Intravenous Given 12/22/16 0017)     Initial Impression / Assessment and Plan / ED Course  I have reviewed the triage vital signs and the nursing notes.  Pertinent labs & imaging results that were available during my care of the patient were reviewed by me and considered in my medical decision making (see chart for details).    1:30 AM Mechanical fall. Right intertrochanteric fracture. Patient will be admitted to the hospital. I discussed the case with orthopedics. Hospitalist admission. Pain control now. Nothing by mouth.  Ortho: Dr Linna CapriceSwinteck [facility]   Final Clinical Impressions(s) / ED Diagnoses   Final diagnoses:  Closed right hip fracture, initial encounter [facility](HCC)  Fall, initial encounter    New Prescriptions New Prescriptions   No medications on file   I personally performed the services described in this documentation, which was scribed in my presence. The recorded information has been reviewed and is accurate.        Azalia Bilisampos, Nadalyn Deringer, MD 12/22/16 [phone]

## 2016-12-21 NOTE — ED Notes (Signed)
Pt is radiology.

## 2016-12-21 NOTE — ED Notes (Signed)
Bed: [id number] Expected date:  Expected time:  Means of arrival:  Comments: EMS 83 yo female fall ground level-right femur pain-no outward rotation noted

## 2016-12-21 NOTE — ED Notes (Signed)
Unable to collect labs at this time patient is gone to xray 

## 2016-12-22 DIAGNOSIS — E785 Hyperlipidemia, unspecified: Secondary | ICD-10-CM | POA: Diagnosis not present

## 2016-12-22 DIAGNOSIS — W06XXXA Fall from bed, initial encounter: Secondary | ICD-10-CM | POA: Diagnosis present

## 2016-12-22 DIAGNOSIS — Z79899 Other long term (current) drug therapy: Secondary | ICD-10-CM | POA: Diagnosis not present

## 2016-12-22 DIAGNOSIS — Y92009 Unspecified place in unspecified non-institutional (private) residence as the place of occurrence of the external cause: Secondary | ICD-10-CM | POA: Diagnosis not present

## 2016-12-22 DIAGNOSIS — Z472 Encounter for removal of internal fixation device: Secondary | ICD-10-CM | POA: Diagnosis not present

## 2016-12-22 DIAGNOSIS — S72001A Fracture of unspecified part of neck of right femur, initial encounter for closed fracture: Secondary | ICD-10-CM | POA: Diagnosis present

## 2016-12-22 DIAGNOSIS — S72141D Displaced intertrochanteric fracture of right femur, subsequent encounter for closed fracture with routine healing: Secondary | ICD-10-CM | POA: Diagnosis not present

## 2016-12-22 DIAGNOSIS — G8911 Acute pain due to trauma: Secondary | ICD-10-CM | POA: Diagnosis not present

## 2016-12-22 DIAGNOSIS — F419 Anxiety disorder, unspecified: Secondary | ICD-10-CM | POA: Diagnosis not present

## 2016-12-22 DIAGNOSIS — G609 Hereditary and idiopathic neuropathy, unspecified: Secondary | ICD-10-CM | POA: Diagnosis not present

## 2016-12-22 DIAGNOSIS — D649 Anemia, unspecified: Secondary | ICD-10-CM | POA: Diagnosis not present

## 2016-12-22 DIAGNOSIS — Z01818 Encounter for other preprocedural examination: Secondary | ICD-10-CM | POA: Diagnosis not present

## 2016-12-22 DIAGNOSIS — E039 Hypothyroidism, unspecified: Secondary | ICD-10-CM | POA: Diagnosis not present

## 2016-12-22 DIAGNOSIS — K219 Gastro-esophageal reflux disease without esophagitis: Secondary | ICD-10-CM | POA: Diagnosis not present

## 2016-12-22 DIAGNOSIS — R4182 Altered mental status, unspecified: Secondary | ICD-10-CM | POA: Diagnosis not present

## 2016-12-22 DIAGNOSIS — G4700 Insomnia, unspecified: Secondary | ICD-10-CM | POA: Diagnosis not present

## 2016-12-22 DIAGNOSIS — R1310 Dysphagia, unspecified: Secondary | ICD-10-CM | POA: Diagnosis present

## 2016-12-22 DIAGNOSIS — Y92003 Bedroom of unspecified non-institutional (private) residence as the place of occurrence of the external cause: Secondary | ICD-10-CM | POA: Diagnosis not present

## 2016-12-22 DIAGNOSIS — S72001S Fracture of unspecified part of neck of right femur, sequela: Secondary | ICD-10-CM | POA: Diagnosis not present

## 2016-12-22 DIAGNOSIS — W19XXXA Unspecified fall, initial encounter: Secondary | ICD-10-CM

## 2016-12-22 DIAGNOSIS — D72829 Elevated white blood cell count, unspecified: Secondary | ICD-10-CM

## 2016-12-22 DIAGNOSIS — S72141A Displaced intertrochanteric fracture of right femur, initial encounter for closed fracture: Secondary | ICD-10-CM | POA: Diagnosis not present

## 2016-12-22 DIAGNOSIS — M25551 Pain in right hip: Secondary | ICD-10-CM | POA: Diagnosis not present

## 2016-12-22 DIAGNOSIS — F0390 Unspecified dementia without behavioral disturbance: Secondary | ICD-10-CM

## 2016-12-22 DIAGNOSIS — Z7982 Long term (current) use of aspirin: Secondary | ICD-10-CM | POA: Diagnosis not present

## 2016-12-22 DIAGNOSIS — D62 Acute posthemorrhagic anemia: Secondary | ICD-10-CM | POA: Diagnosis not present

## 2016-12-22 DIAGNOSIS — Z880 Allergy status to penicillin: Secondary | ICD-10-CM | POA: Diagnosis not present

## 2016-12-22 DIAGNOSIS — I10 Essential (primary) hypertension: Secondary | ICD-10-CM | POA: Diagnosis not present

## 2016-12-22 DIAGNOSIS — Z87891 Personal history of nicotine dependence: Secondary | ICD-10-CM | POA: Diagnosis not present

## 2016-12-22 DIAGNOSIS — Z9181 History of falling: Secondary | ICD-10-CM | POA: Diagnosis not present

## 2016-12-22 LAB — COMPREHENSIVE METABOLIC PANEL
ALBUMIN: 3.4 g/dL — AB (ref 3.5–5.0)
ALT: 9 U/L — ABNORMAL LOW (ref 14–54)
AST: 16 U/L (ref 15–41)
Alkaline Phosphatase: 67 U/L (ref 38–126)
Anion gap: 8 (ref 5–15)
BUN: 16 mg/dL (ref 6–20)
CHLORIDE: 107 mmol/L (ref 101–111)
CO2: 23 mmol/L (ref 22–32)
Calcium: 8.7 mg/dL — ABNORMAL LOW (ref 8.9–10.3)
Creatinine, Ser: 0.87 mg/dL (ref 0.44–1.00)
GFR, EST NON AFRICAN AMERICAN: 57 mL/min — AB (ref >60)
Glucose, Bld: 126 mg/dL — ABNORMAL HIGH (ref 65–99)
POTASSIUM: 3.8 mmol/L (ref 3.5–5.1)
Sodium: 138 mmol/L (ref 135–145)
Total Bilirubin: 0.6 mg/dL (ref 0.3–1.2)
Total Protein: 6.1 g/dL — ABNORMAL LOW (ref 6.5–8.1)

## 2016-12-22 LAB — CBC
HEMATOCRIT: 39.9 % (ref 36.0–46.0)
HEMOGLOBIN: 12.8 g/dL (ref 12.0–15.0)
MCH: 29.6 pg (ref 26.0–34.0)
MCHC: 32.1 g/dL (ref 30.0–36.0)
MCV: 92.1 fL (ref 78.0–100.0)
Platelets: 244 10*3/uL (ref 150–400)
RBC: 4.33 MIL/uL (ref 3.87–5.11)
RDW: 13.7 % (ref 11.5–15.5)
WBC: 15 10*3/uL — ABNORMAL HIGH (ref 4.0–10.5)

## 2016-12-22 LAB — TYPE AND SCREEN
ABO/RH(D): AB POS
ANTIBODY SCREEN: NEGATIVE

## 2016-12-22 LAB — PROTIME-INR
INR: 0.91
PROTHROMBIN TIME: 12.2 s (ref 11.4–15.2)

## 2016-12-22 LAB — SURGICAL PCR SCREEN
MRSA, PCR: NEGATIVE
STAPHYLOCOCCUS AUREUS: NEGATIVE

## 2016-12-22 LAB — ABO/RH
ABO/RH(D): AB POS
ABO/RH(D): AB POS

## 2016-12-22 MED ORDER — LEVOTHYROXINE SODIUM 75 MCG PO TABS
75.0000 ug | ORAL_TABLET | Freq: Every day | ORAL | Status: DC
  Administered 2016-12-24 – 2016-12-27 (×4): 75 ug via ORAL
  Filled 2016-12-22 (×3): qty 1

## 2016-12-22 MED ORDER — METHOCARBAMOL 500 MG PO TABS
500.0000 mg | ORAL_TABLET | Freq: Four times a day (QID) | ORAL | Status: DC | PRN
  Administered 2016-12-23 – 2016-12-25 (×3): 500 mg via ORAL
  Filled 2016-12-22 (×3): qty 1

## 2016-12-22 MED ORDER — METHOCARBAMOL 1000 MG/10ML IJ SOLN: 500.0000 mg | Freq: Four times a day (QID) | INTRAVENOUS | Status: DC | PRN

## 2016-12-22 MED ORDER — SODIUM CHLORIDE 0.9 % IV SOLN
INTRAVENOUS | Status: DC
  Administered 2016-12-22: 03:00:00 via INTRAVENOUS

## 2016-12-22 MED ORDER — CEFAZOLIN SODIUM-DEXTROSE 2-4 GM/100ML-% IV SOLN
2.0000 g | INTRAVENOUS | Status: AC
  Administered 2016-12-23: 2 g via INTRAVENOUS
  Filled 2016-12-22: qty 100

## 2016-12-22 MED ORDER — ATORVASTATIN CALCIUM 10 MG PO TABS
10.0000 mg | ORAL_TABLET | Freq: Every day | ORAL | Status: DC
  Administered 2016-12-22 – 2016-12-25 (×4): 10 mg via ORAL
  Filled 2016-12-22 (×5): qty 1

## 2016-12-22 MED ORDER — HYDROCODONE-ACETAMINOPHEN 5-325 MG PO TABS
1.0000 | ORAL_TABLET | Freq: Four times a day (QID) | ORAL | Status: DC | PRN
  Administered 2016-12-23 – 2016-12-24 (×2): 2 via ORAL
  Filled 2016-12-22 (×2): qty 2

## 2016-12-22 MED ORDER — MELATONIN 3 MG PO TABS
4.5000 mg | ORAL_TABLET | Freq: Every day | ORAL | Status: DC
  Administered 2016-12-22 – 2016-12-26 (×5): 4.5 mg via ORAL
  Filled 2016-12-22 (×7): qty 1.5

## 2016-12-22 MED ORDER — CLONAZEPAM 0.5 MG PO TABS
0.5000 mg | ORAL_TABLET | Freq: Three times a day (TID) | ORAL | Status: DC | PRN
  Administered 2016-12-22 – 2016-12-26 (×2): 0.5 mg via ORAL
  Filled 2016-12-22 (×2): qty 1

## 2016-12-22 MED ORDER — DONEPEZIL HCL 10 MG PO TABS
10.0000 mg | ORAL_TABLET | Freq: Every day | ORAL | Status: DC
  Administered 2016-12-22 – 2016-12-26 (×5): 10 mg via ORAL
  Filled 2016-12-22: qty 2
  Filled 2016-12-22 (×4): qty 1

## 2016-12-22 MED ORDER — ENOXAPARIN SODIUM 40 MG/0.4ML ~~LOC~~ SOLN: 40.0000 mg | SUBCUTANEOUS | Status: DC

## 2016-12-22 MED ORDER — POVIDONE-IODINE 10 % EX SWAB
2.0000 "application " | Freq: Once | CUTANEOUS | Status: AC
  Administered 2016-12-23: 2 via TOPICAL

## 2016-12-22 MED ORDER — MORPHINE SULFATE (PF) 4 MG/ML IV SOLN
0.5000 mg | INTRAVENOUS | Status: DC | PRN
  Administered 2016-12-22: 0.52 mg via INTRAVENOUS
  Filled 2016-12-22: qty 1

## 2016-12-22 MED ORDER — RISAQUAD PO CAPS
1.0000 | ORAL_CAPSULE | Freq: Every day | ORAL | Status: DC
  Administered 2016-12-24 – 2016-12-27 (×4): 1 via ORAL
  Filled 2016-12-22 (×7): qty 1

## 2016-12-22 MED ORDER — MORPHINE SULFATE (PF) 2 MG/ML IV SOLN: 0.5000 mg | INTRAVENOUS | Status: DC | PRN

## 2016-12-22 MED ORDER — QUETIAPINE FUMARATE 25 MG PO TABS
25.0000 mg | ORAL_TABLET | Freq: Every evening | ORAL | Status: DC | PRN
  Filled 2016-12-22: qty 1

## 2016-12-22 MED ORDER — CHLORHEXIDINE GLUCONATE 4 % EX LIQD
60.0000 mL | Freq: Once | CUTANEOUS | Status: AC
  Administered 2016-12-23: 4 via TOPICAL

## 2016-12-22 MED ORDER — SERTRALINE HCL 50 MG PO TABS
75.0000 mg | ORAL_TABLET | Freq: Every day | ORAL | Status: DC
  Administered 2016-12-24 – 2016-12-27 (×4): 75 mg via ORAL
  Filled 2016-12-22 (×4): qty 1

## 2016-12-22 NOTE — ED Notes (Signed)
Gave report to Cloyd Stagersoni Sneiderman RN at [facility] for [phone].

## 2016-12-22 NOTE — H&P (Addendum)
History and Physical    KIT BRUBACHER ZOX:[id number] DOB: 1924/06/24 DOA: 12/21/2016  Referring MD/NP/PA: Dr. Patria Mane PCP: Sherren Mocha, MD  Patient coming from: Home via EMS  Chief Complaint: Fall  HPI: Brianna Reynolds is a 83 y.o. female with medical history significant of HTN, HLD, recurrent C. difficile, dementia, and hypothyroidism; who presents with having having a fall at home while preparing for bed. History is obtained from the patient and from review of report. She reports significant pain out of the right proximal leg with inability to ambulate or bear weight on the affected leg. Denies hitting her head or loss of consciousness. Prior to tonight patient notes that she lives at home with her husband and is ambulatory without need of assistance. Denies any chest pain, shortness of breath, cough, nausea, vomiting, fever, chills, dysuria.  ED Course: Upon admission into the emergency department patient was seen to be afebrile with vital signs relatively within normal limits. Labs revealed WBC 15. Imaging studies revealed a right comminuted displaced and slightly angulated right intertrochanteric intertrochanteric fracture. Dr. Leroy Libman of orthopedics was consulted and recommended transfer to [facility] and keep patient npo.  Review of Systems: As per HPI otherwise 10 point review of systems negative.   Past Medical History:  Diagnosis Date  . Abnormal EKG   . Anxiety   . C. difficile colitis   . Cataract   . Fatigue   . HTN (hypertension)    New  . Hyperlipidemia   . Hypothyroid   . Memory loss   . Pneumonia     Past Surgical History:  Procedure Laterality Date  . BREAST SURGERY Bilateral   . COLONOSCOPY    . COLONOSCOPY N/A 10/27/2014   Procedure: COLONOSCOPY;  Surgeon: Iva Boop, MD;  Location: [facility] ENDOSCOPY;  Service: Endoscopy;  Laterality: N/A;  . PARATHYROIDECTOMY    . THYROIDECTOMY, PARTIAL    . TONSILLECTOMY       reports that she quit smoking about 25  years ago. Her smoking use included Cigarettes. She has never used smokeless tobacco. She reports that she does not drink alcohol or use drugs.  Allergies  Allergen Reactions  . Penicillins Rash    Family History  Problem Relation Age of Onset  . Arthritis Mother     Prior to Admission medications   Medication Sig Start Date End Date Taking? Authorizing Provider  clonazePAM (KLONOPIN) 0.5 MG tablet TAKE 1 TABLET THREE TIMES DAILY AS NEEDED FOR ANXIETY. 09/20/16  Yes Sherren Mocha, MD  levothyroxine (SYNTHROID, LEVOTHROID) 75 MCG tablet Take 1 tablet (75 mcg total) by mouth daily before breakfast. 12/17/16  Yes Sherren Mocha, MD  acetaminophen (TYLENOL) 325 MG tablet Take 650 mg by mouth every 6 (six) hours as needed for headache (headache).    [provider]  aspirin EC 81 MG tablet Take 81 mg by mouth daily.    [provider]  atorvastatin (LIPITOR) 10 MG tablet TAKE 1 TABLET EACH DAY. 08/24/16   Trena Platt D, PA  bifidobacterium infantis (ALIGN) capsule Take 1 capsule by mouth daily. 01/20/16   Weber, Dema Severin, PA-C  Calcium Carbonate (CALTRATE 600 PO) Take 1 tablet by mouth daily.    [provider]  Cholecalciferol (VITAMIN D) 2000 UNITS CAPS Take 1 capsule by mouth daily.     [provider]  donepezil (ARICEPT) 10 MG tablet Take 10 mg by mouth. 12/04/15   [provider]  Melatonin 5 MG TABS Take  5 mg by mouth at bedtime.     [provider]  Multiple Vitamins-Minerals (PRESERVISION AREDS) CAPS Take 1 capsule by mouth daily.    [provider]  QUEtiapine (SEROQUEL) 25 MG tablet TAKE 1 TABLET AT BEDTIME AS NEEDED FOR INSOMNIA, MAY REPEAT IN 1-2 HOURS IF NEEDED 07/21/15   Collene Gobble, MD  sertraline (ZOLOFT) 50 MG tablet TAKE 1&1/2 TABLETS ONCE DAILY. 08/28/15   Collene Gobble, MD  tolterodine (DETROL LA) 4 MG 24 hr capsule TAKE (1) CAPSULE DAILY. 12/15/16   Sherren Mocha, MD    Physical  Exam:    Constitutional:Elderly female who is currently in no acute distress.  Vitals:   12/21/16 2248 12/21/16 2251 12/22/16 0020  BP: 121/72  (!) 148/137  Pulse: 85  85  Resp: 18  20  Temp: 98.1 F (36.7 C)  98 F (36.7 C)  TempSrc: Oral  Oral  SpO2: 94% 95% 97%   Eyes: PERRL, lids and conjunctivae normal ENMT: Mucous membranes are moist. Posterior pharynx clear of any exudate or lesions.Normal dentition.  Neck: normal, supple, no masses, no thyromegaly Respiratory: clear to auscultation bilaterally, no wheezing, no crackles. Normal respiratory effort. No accessory muscle use.  Cardiovascular: Regular rate and rhythm, no murmurs / rubs / gallops. No extremity edema. 2+ pedal pulses. No carotid bruits.  Abdomen: no tenderness, no masses palpated. No hepatosplenomegaly. Bowel sounds positive.  Musculoskeletal: no clubbing / cyanosis. Deformity noted of the proximal thigh with decreased range of motion secondary to pain. skin: no rashes, lesions, ulcers. No induration Neurologic: CN 2-12 grossly intact. Sensation intact, DTR normal. Strength 5/5 in all 4.  Psychiatric: Normal judgment and insight. mild memory impairment.  Alert and oriented x 3. Normal mood.     Labs on Admission: I have personally reviewed following labs and imaging studies  CBC:  Recent Labs Lab 12/21/16 2348  WBC 15.0*  HGB 12.8  HCT 39.9  MCV 92.1  PLT 244   Basic Metabolic Panel:  Recent Labs Lab 12/21/16 2348  NA 138  K 3.8  CL 107  CO2 23  GLUCOSE 126*  BUN 16  CREATININE 0.87  CALCIUM 8.7*   GFR: Estimated Creatinine Clearance: 32.5 mL/min (by C-G formula based on SCr of 0.87 mg/dL). Liver Function Tests:  Recent Labs Lab 12/21/16 2348  AST 16  ALT 9*  ALKPHOS 67  BILITOT 0.6  PROT 6.1*  ALBUMIN 3.4*   No results for input(s): LIPASE, AMYLASE in the last 168 hours. No results for input(s): AMMONIA in the last 168 hours. Coagulation Profile:  Recent Labs Lab  12/21/16 2348  INR 0.91   Cardiac Enzymes: No results for input(s): CKTOTAL, CKMB, CKMBINDEX, TROPONINI in the last 168 hours. BNP (last 3 results) No results for input(s): PROBNP in the last 8760 hours. HbA1C: No results for input(s): HGBA1C in the last 72 hours. CBG: No results for input(s): GLUCAP in the last 168 hours. Lipid Profile: No results for input(s): CHOL, HDL, LDLCALC, TRIG, CHOLHDL, LDLDIRECT in the last 72 hours. Thyroid Function Tests: No results for input(s): TSH, T4TOTAL, FREET4, T3FREE, THYROIDAB in the last 72 hours. Anemia Panel: No results for input(s): VITAMINB12, FOLATE, FERRITIN, TIBC, IRON, RETICCTPCT in the last 72 hours. Urine analysis:    Component Value Date/Time   COLORURINE YELLOW 03/24/2014 2135   APPEARANCEUR CLEAR 03/24/2014 2135   LABSPEC 1.012 03/24/2014 2135   PHURINE 8.0 03/24/2014 2135   GLUCOSEU NEGATIVE 03/24/2014 2135   HGBUR SMALL (A)  03/24/2014 2135   BILIRUBINUR negative 12/16/2016 1417   BILIRUBINUR jneg 07/03/2014 1237   KETONESUR trace (5) (A) 12/16/2016 1417   KETONESUR NEGATIVE 03/24/2014 2135   PROTEINUR negative 12/16/2016 1417   PROTEINUR neg 07/03/2014 1237   PROTEINUR NEGATIVE 03/24/2014 2135   UROBILINOGEN 0.2 12/16/2016 1417   UROBILINOGEN 0.2 03/24/2014 2135   NITRITE Negative 12/16/2016 1417   NITRITE neg 07/03/2014 1237   NITRITE NEGATIVE 03/24/2014 2135   LEUKOCYTESUR Negative 12/16/2016 1417   Sepsis Labs: Recent Results (from the past 240 hour(s))  Urine culture     Status: None   Collection Time: 12/16/16  2:17 PM  Result Value Ref Range Status   Urine Culture, Routine Final report  Final   Urine Culture result 1 Comment  Final    Comment: Mixed urogenital flora Less than 10,000 colonies/mL      Radiological Exams on Admission: Dg Chest 1 View  Result Date: 12/22/2016 CLINICAL DATA:  Fall with hip fracture EXAM: CHEST 1 VIEW COMPARISON:  06/12/2014 FINDINGS: Mild cardiomegaly without edema. Aortic  atherosclerosis. No pneumothorax. Minimal basilar atelectasis. IMPRESSION: 1. Minimal basilar atelectasis.  No acute infiltrate or edema 2. Cardiomegaly without overt failure. Electronically Signed   By: Jasmine PangKim  Fujinaga M.D.   On: 12/22/2016 00:08   Dg Hip Unilat  With Pelvis 2-3 Views Right  Result Date: 12/22/2016 CLINICAL DATA:  Fall while walking to the bathroom EXAM: DG HIP (WITH OR WITHOUT PELVIS) 2-3V RIGHT COMPARISON:  CT 05/18/2014 FINDINGS: Right greater than left SI joint arthritis. The pubic symphysis appears intact. Comminuted intertrochanteric fracture with varus angulation and superior migration of the distal femoral fragment. Close to 1/2 shaft diameter of anterior displacement of distal fracture fragment on the lateral view. Displaced lesser trochanteric fracture fragment. IMPRESSION: Comminuted displaced and slightly angulated right intertrochanteric fracture. Electronically Signed   By: Jasmine PangKim  Fujinaga M.D.   On: 12/22/2016 00:06   Dg Femur Min 2 Views Right  Result Date: 12/22/2016 CLINICAL DATA:  Fall with pain EXAM: RIGHT FEMUR 2 VIEWS COMPARISON:  None. FINDINGS: Pubic symphysis appears intact. Right femoral head projects in joint. Comminuted and displaced proximal right femur fracture. Distal femur is intact. Degenerative changes at the right knee. Vascular calcifications. IMPRESSION: Comminuted and displaced proximal femoral fracture. Distal femur appears intact. Electronically Signed   By: Jasmine PangKim  Fujinaga M.D.   On: 12/22/2016 00:07    EKG: Independently reviewed. Sinus rhythm with premature atrial complex  Assessment/Plan Right communicated displaced femur fracture 2/2 fall: Acute. Patient sustained a fall at home and presents with right hip pain. Found to have acute fracture of the right femur. Orthopedics consulted and will see in a.m. - [facility] - Hip fracture order set initiated - Morphine IV prn pain - IV fluids NS at 75 ml/hr  Leukocytosis:  Acute. WBC elevated to 15. Chest x-ray without acute infiltrate and recent UA 5/18 negative for signs of infection. Thought likely to be reactive in nature. - Continue to monitor  Anxiety - Continue clonazepam prn anxiety  Hypothyroidism: TSH 1.04 on 12/15/2016.  - Continue levothyroxine    Dementia - Continue Aricept DVT prophylaxis: lovenox   Code Status: Full  Family Communication:  no family present at bedside  Disposition Plan: Likely will need rehabilitation following surgical correction   Consults called:  orthopedic surgery  Admission status: Inpatient   Clydie Braunondell A Blanca Thornton MD [facility] Pager [phone]  If 7PM-7AM, please contact night-coverage [email or web address] Password [email or web address]  12/22/2016, 1:38 AM

## 2016-12-22 NOTE — Progress Notes (Signed)
PROGRESS NOTE    Brianna Reynolds  ZOX:[id number] DOB: [birth year] DOA: 12/21/2016 PCP: Sherren Mocha, MD    Brief Narrative:  83 yo female presented with the chief complain of a fall. Patient known to have HTN, dementia, recurrent C. Diff and hypothyroidism. Sustained mechanical fall at home. Post trauma complained of sever pain at the right proximal leg, unable to ambulate or bear weight. No head trauma or loss of consciousness. On the initial physical examination, blood pressure 121/72 with heart rate 85 with respiratory rate 18 and oxygen saturation 94%. Mucous membranes were moist, lungs clear to auscultation, rhythmic S1 and S2, abdomen soft, no lower extremity edema. Positive deformity on the proximal thigh with decreases range of motion due to pain. Imaging studies, with right comminuted displaced femoral fracture. Admitted to the medical unit, placed on analgesic therapy and consulted orthopedic service.    Assessment & Plan:   Principal Problem:   Closed right hip fracture [facility]) Active Problems:   Hypothyroid   Leukocytosis   Fall at home, initial encounter   Dementia   1. Right comminuted displaced femur fracture. Patient with advanced age, abel to do more than 4 METs, per patient;s husband at the bedside. Patient medically fit for orthopedic procedure, moderate risk for cardiovascular complications. Continue pain control with hydrocodone and morphine. DVT prophylaxis, follow on orthopedic recommendations.   2. Leukocytosis. Suspected to be reactive, UA negative for infection on 5/18, chest film personally reviewed, noted rotation to the right side, no infiltrates.   3. Anxiety. No signs of confusion or agitation, will continue pain control, continue clonazepam, melatonin, quetiapine and sertraline.   4. Hypothyroidism. Continue levothyroxine per home regimen.   5. Dementia. Continue neuro checks per unit protocol, continue donepezil.   6. Dislipidemia. Continue  atorvastatin.   DVT prophylaxis: enoxaparin  Code Status: Full  Family Communication:  Disposition Plan:    Consultants:   Orthopedics  Procedures:     Antimicrobials:    Subjective: Patient feeling stable, pain controlled, but worse with movement, improved with pain medications. Patient at home physically active, able to climb steps and do light physical work. No chest pain, no dyspnea, no nausea or vomiting.   Objective: Vitals:   12/22/16 0240 12/22/16 0300 12/22/16 0330 12/22/16 0430  BP: 90/64 (!) 116/56 (!) 119/49 (!) 141/46  Pulse: 87 85 89 78  Resp: 19 (!) 24 18 18   Temp:    99.1 F (37.3 C)  TempSrc:    Oral  SpO2: 94% 95% 94% 94%   No intake or output data in the 24 hours ending 12/22/16 1223 There were no vitals filed for this visit.  Examination:  General exam: deconditioned E ENT. Mild pallor, no icterus, oral mucosa moist.  Respiratory system: Clear to auscultation. Respiratory effort normal. No wheezing, rales or rhonchi, mild decreased breath sounds at bases.  Cardiovascular system: S1 & S2 heard, RRR. No JVD, murmurs, rubs, gallops or clicks. No pedal edema. Gastrointestinal system: Abdomen is nondistended, soft and nontender. No organomegaly or masses felt. Normal bowel sounds heard. Central nervous system: Alert and oriented. No focal neurological deficits. Extremities: Shortening and externally rotated right lower extremity. Decreased range of motion due to pain.  Skin: No rashes, lesions or ulcers     Data Reviewed: I have personally reviewed following labs and imaging studies  CBC:  Recent Labs Lab 12/21/16 2348  WBC 15.0*  HGB 12.8  HCT 39.9  MCV 92.1  PLT 244   Basic Metabolic Panel:  Recent Labs Lab 12/21/16 2348  NA 138  K 3.8  CL 107  CO2 23  GLUCOSE 126*  BUN 16  CREATININE 0.87  CALCIUM 8.7*   GFR: Estimated Creatinine Clearance: 32.5 mL/min (by C-G formula based on SCr of 0.87 mg/dL). Liver Function  Tests:  Recent Labs Lab 12/21/16 2348  AST 16  ALT 9*  ALKPHOS 67  BILITOT 0.6  PROT 6.1*  ALBUMIN 3.4*   No results for input(s): LIPASE, AMYLASE in the last 168 hours. No results for input(s): AMMONIA in the last 168 hours. Coagulation Profile:  Recent Labs Lab 12/21/16 2348  INR 0.91   Cardiac Enzymes: No results for input(s): CKTOTAL, CKMB, CKMBINDEX, TROPONINI in the last 168 hours. BNP (last 3 results) No results for input(s): PROBNP in the last 8760 hours. HbA1C: No results for input(s): HGBA1C in the last 72 hours. CBG: No results for input(s): GLUCAP in the last 168 hours. Lipid Profile: No results for input(s): CHOL, HDL, LDLCALC, TRIG, CHOLHDL, LDLDIRECT in the last 72 hours. Thyroid Function Tests: No results for input(s): TSH, T4TOTAL, FREET4, T3FREE, THYROIDAB in the last 72 hours. Anemia Panel: No results for input(s): VITAMINB12, FOLATE, FERRITIN, TIBC, IRON, RETICCTPCT in the last 72 hours. Sepsis Labs: No results for input(s): PROCALCITON, LATICACIDVEN in the last 168 hours.  Recent Results (from the past 240 hour(s))  Urine culture     Status: None   Collection Time: 12/16/16  2:17 PM  Result Value Ref Range Status   Urine Culture, Routine Final report  Final   Urine Culture result 1 Comment  Final    Comment: Mixed urogenital flora Less than 10,000 colonies/mL   Surgical pcr screen     Status: None   Collection Time: 12/22/16  4:36 AM  Result Value Ref Range Status   MRSA, PCR NEGATIVE NEGATIVE Final   Staphylococcus aureus NEGATIVE NEGATIVE Final    Comment:        The Xpert SA Assay (FDA approved for NASAL specimens in patients over 81 years of age), is one component of a comprehensive surveillance program.  Test performance has been validated by [facility] for patients greater than or equal to 81 year old. It is not intended to diagnose infection nor to guide or monitor treatment.          Radiology Studies: Dg Chest 1  View  Result Date: 12/22/2016 CLINICAL DATA:  Fall with hip fracture EXAM: CHEST 1 VIEW COMPARISON:  06/12/2014 FINDINGS: Mild cardiomegaly without edema. Aortic atherosclerosis. No pneumothorax. Minimal basilar atelectasis. IMPRESSION: 1. Minimal basilar atelectasis.  No acute infiltrate or edema 2. Cardiomegaly without overt failure. Electronically Signed   By: Jasmine PangKim  Fujinaga M.D.   On: 12/22/2016 00:08   Dg Hip Unilat  With Pelvis 2-3 Views Right  Result Date: 12/22/2016 CLINICAL DATA:  Fall while walking to the bathroom EXAM: DG HIP (WITH OR WITHOUT PELVIS) 2-3V RIGHT COMPARISON:  CT 05/18/2014 FINDINGS: Right greater than left SI joint arthritis. The pubic symphysis appears intact. Comminuted intertrochanteric fracture with varus angulation and superior migration of the distal femoral fragment. Close to 1/2 shaft diameter of anterior displacement of distal fracture fragment on the lateral view. Displaced lesser trochanteric fracture fragment. IMPRESSION: Comminuted displaced and slightly angulated right intertrochanteric fracture. Electronically Signed   By: Jasmine PangKim  Fujinaga M.D.   On: 12/22/2016 00:06   Dg Femur Min 2 Views Right  Result Date: 12/22/2016 CLINICAL DATA:  Fall with pain EXAM: RIGHT FEMUR 2 VIEWS COMPARISON:  None. FINDINGS: Pubic symphysis appears intact. Right femoral head projects in joint. Comminuted and displaced proximal right femur fracture. Distal femur is intact. Degenerative changes at the right knee. Vascular calcifications. IMPRESSION: Comminuted and displaced proximal femoral fracture. Distal femur appears intact. Electronically Signed   By: Jasmine Pang M.D.   On: 12/22/2016 00:07        Scheduled Meds: . acidophilus  1 capsule Oral Daily  . atorvastatin  10 mg Oral q1800  . donepezil  10 mg Oral QHS  . [START ON 12/23/2016] enoxaparin (LOVENOX) injection  40 mg Subcutaneous Q24H  . levothyroxine  75 mcg Oral QAC breakfast  . Melatonin  4.5 mg Oral QHS  .  sertraline  75 mg Oral Daily   Continuous Infusions: . sodium chloride 75 mL/hr at 12/22/16 0238  . methocarbamol (ROBAXIN)  IV       LOS: 0 days        Coralie Keens, MD [facility] Pager [phone]  If 7PM-7AM, please contact night-coverage [email or web address] Password [facility] 12/22/2016, 12:23 PM

## 2016-12-22 NOTE — ED Notes (Signed)
[facility] and primary nurse will have to call back.

## 2016-12-22 NOTE — ED Notes (Signed)
[facility].

## 2016-12-22 NOTE — Consult Note (Signed)
Reason for Consult:Right intertroch hip fx Referring Physician: Peterson Ao is an 83 y.o. female.  HPI: Brianna Reynolds was home last night getting ready for bed. She turned the lights off and then slipped on something and fell. She had immediate pain in her right hip. A neighbor brought them to [facility] where x-rays showed a right intertroch hip fx. Orthopedic surgery was consulted and requested she be transferred to [facility] to undergo surgery on Friday.  Past Medical History:  Diagnosis Date  . Abnormal EKG   . Anxiety   . C. difficile colitis   . Cataract   . Fatigue   . HTN (hypertension)    New  . Hyperlipidemia   . Hypothyroid   . Memory loss   . Pneumonia     Past Surgical History:  Procedure Laterality Date  . BREAST SURGERY Bilateral   . COLONOSCOPY    . COLONOSCOPY N/A 10/27/2014   Procedure: COLONOSCOPY;  Surgeon: Gatha Mayer, MD;  Location: [place];  Service: Endoscopy;  Laterality: N/A;  . PARATHYROIDECTOMY    . THYROIDECTOMY, PARTIAL    . TONSILLECTOMY      Family History  Problem Relation Age of Onset  . Arthritis Mother     Social History:  reports that she quit smoking about 25 years ago. Her smoking use included Cigarettes. She has never used smokeless tobacco. She reports that she does not drink alcohol or use drugs.  Allergies:  Allergies  Allergen Reactions  . Penicillins Rash    Medications: I have reviewed the patient's current medications.  Results for orders placed or performed during the hospital encounter of 12/21/16 (from the past 48 hour(s))  CBC     Status: Abnormal   Collection Time: 12/21/16 11:48 PM  Result Value Ref Range   WBC 15.0 (H) 4.0 - 10.5 K/uL   RBC 4.33 3.87 - 5.11 MIL/uL   Hemoglobin 12.8 12.0 - 15.0 g/dL   HCT 39.9 36.0 - 46.0 %   MCV 92.1 78.0 - 100.0 fL   MCH 29.6 26.0 - 34.0 pg   MCHC 32.1 30.0 - 36.0 g/dL   RDW 13.7 11.5 - 15.5 %   Platelets 244 150 - 400 K/uL  Comprehensive metabolic panel     Status:  Abnormal   Collection Time: 12/21/16 11:48 PM  Result Value Ref Range   Sodium 138 135 - 145 mmol/L   Potassium 3.8 3.5 - 5.1 mmol/L   Chloride 107 101 - 111 mmol/L   CO2 23 22 - 32 mmol/L   Glucose, Bld 126 (H) 65 - 99 mg/dL   BUN 16 6 - 20 mg/dL   Creatinine, Ser 0.87 0.44 - 1.00 mg/dL   Calcium 8.7 (L) 8.9 - 10.3 mg/dL   Total Protein 6.1 (L) 6.5 - 8.1 g/dL   Albumin 3.4 (L) 3.5 - 5.0 g/dL   AST 16 15 - 41 U/L   ALT 9 (L) 14 - 54 U/L   Alkaline Phosphatase 67 38 - 126 U/L   Total Bilirubin 0.6 0.3 - 1.2 mg/dL   GFR calc non Af Amer 57 (L) >60 mL/min   GFR calc Af Amer >60 >60 mL/min    Comment: (NOTE) The eGFR has been calculated using the CKD EPI equation. This calculation has not been validated in all clinical situations. eGFR's persistently <60 mL/min signify possible Chronic Kidney Disease.    Anion gap 8 5 - 15  Protime-INR     Status: None  Collection Time: 12/21/16 11:48 PM  Result Value Ref Range   Prothrombin Time 12.2 11.4 - 15.2 seconds   INR 0.91   Type and screen [facility]     Status: None   Collection Time: 12/21/16 11:48 PM  Result Value Ref Range   ABO/RH(D) AB POS    Antibody Screen NEG    Sample Expiration 12/24/2016   ABO/Rh     Status: None   Collection Time: 12/22/16 12:01 AM  Result Value Ref Range   ABO/RH(D) AB POS   Surgical pcr screen     Status: None   Collection Time: 12/22/16  4:36 AM  Result Value Ref Range   MRSA, PCR NEGATIVE NEGATIVE   Staphylococcus aureus NEGATIVE NEGATIVE    Comment:        The Xpert SA Assay (FDA approved for NASAL specimens in patients over 21 years of age), is one component of a comprehensive surveillance program.  Test performance has been validated by [facility] for patients greater than or equal to 1 year old. It is not intended to diagnose infection nor to guide or monitor treatment.   Type and screen [facility]     Status: None   Collection Time:  12/22/16  6:34 AM  Result Value Ref Range   ABO/RH(D) AB POS    Antibody Screen NEG    Sample Expiration 12/25/2016   ABO/Rh     Status: None   Collection Time: 12/22/16  6:34 AM  Result Value Ref Range   ABO/RH(D) AB POS     Dg Chest 1 View  Result Date: 12/22/2016 CLINICAL DATA:  Fall with hip fracture EXAM: CHEST 1 VIEW COMPARISON:  06/12/2014 FINDINGS: Mild cardiomegaly without edema. Aortic atherosclerosis. No pneumothorax. Minimal basilar atelectasis. IMPRESSION: 1. Minimal basilar atelectasis.  No acute infiltrate or edema 2. Cardiomegaly without overt failure. Electronically Signed   By: Kim  Fujinaga M.D.   On: 12/22/2016 00:08   Dg Hip Unilat  With Pelvis 2-3 Views Right  Result Date: 12/22/2016 CLINICAL DATA:  Fall while walking to the bathroom EXAM: DG HIP (WITH OR WITHOUT PELVIS) 2-3V RIGHT COMPARISON:  CT 05/18/2014 FINDINGS: Right greater than left SI joint arthritis. The pubic symphysis appears intact. Comminuted intertrochanteric fracture with varus angulation and superior migration of the distal femoral fragment. Close to 1/2 shaft diameter of anterior displacement of distal fracture fragment on the lateral view. Displaced lesser trochanteric fracture fragment. IMPRESSION: Comminuted displaced and slightly angulated right intertrochanteric fracture. Electronically Signed   By: Kim  Fujinaga M.D.   On: 12/22/2016 00:06   Dg Femur Min 2 Views Right  Result Date: 12/22/2016 CLINICAL DATA:  Fall with pain EXAM: RIGHT FEMUR 2 VIEWS COMPARISON:  None. FINDINGS: Pubic symphysis appears intact. Right femoral head projects in joint. Comminuted and displaced proximal right femur fracture. Distal femur is intact. Degenerative changes at the right knee. Vascular calcifications. IMPRESSION: Comminuted and displaced proximal femoral fracture. Distal femur appears intact. Electronically Signed   By: Kim  Fujinaga M.D.   On: 12/22/2016 00:07    Review of Systems  Constitutional: Negative  for weight loss.  HENT: Negative for ear discharge, ear pain, hearing loss and tinnitus.   Eyes: Negative for blurred vision, double vision, photophobia and pain.  Respiratory: Negative for cough, sputum production and shortness of breath.   Cardiovascular: Negative for chest pain.  Gastrointestinal: Negative for abdominal pain, nausea and vomiting.  Genitourinary: Negative for dysuria, flank pain, frequency and urgency.    Musculoskeletal: Positive for joint pain (Right hip'). Negative for back pain, falls, myalgias and neck pain.  Neurological: Negative for dizziness, tingling, sensory change, focal weakness, loss of consciousness and headaches.  Endo/Heme/Allergies: Does not bruise/bleed easily.  Psychiatric/Behavioral: Negative for depression, memory loss and substance abuse. The patient is not nervous/anxious.    Blood pressure (!) 141/46, pulse 78, temperature 99.1 F (37.3 C), temperature source Oral, resp. rate 18, SpO2 94 %. Physical Exam  Constitutional: She appears well-developed and well-nourished. No distress.  HENT:  Head: Normocephalic.  Eyes: Conjunctivae are normal. Right eye exhibits no discharge. Left eye exhibits no discharge. No scleral icterus.  Cardiovascular: Normal rate and regular rhythm.   Respiratory: Effort normal. No respiratory distress.  Musculoskeletal:  Bilateral shoulder, elbow, wrist, digits- no skin wounds, nontender, no instability, no blocks to motion  Sens  Ax/R/M/U intact  Mot   Ax/ R/ PIN/ M/ AIN/ U intact  Rad 2+  RLE No traumatic wounds, ecchymosis, or rash  TTP hip  No effusions  Knee stable to varus/ valgus and anterior/posterior stress  Sens DPN, SPN, TN intact  Motor EHL, ext, flex, evers 5/5  DP 2+, PT 0, No significant edema   LLE No traumatic wounds, ecchymosis, or rash  Nontender  No effusions  Knee stable to varus/ valgus and anterior/posterior stress  Sens DPN, SPN, TN intact  Motor EHL, ext, flex, evers 5/5  DP 2+, PT 0, No  significant edema  Neurological: She is alert.  E/o dementia with repeated questions, some perseverance  Skin: Skin is warm and dry. She is not diaphoretic.  Psychiatric: She has a normal mood and affect. Her behavior is normal.    Assessment/Plan: Fall Right intertrochanteric femur fx -- For IM nail tomorrow morning by Dr. Alcie Runions. NPO after MN. Multiple medical problems -- per primary service VTE -- Lovenox    Michael J. Jeffery, PA-C Orthopedic Surgery [phone] 12/22/2016, 9:10 AM  

## 2016-12-22 NOTE — ED Notes (Signed)
Dr. Haywood LassoK. Campos would like us to hold on placing foley catheter due to CAUTI prevention while in the emergency department.

## 2016-12-22 NOTE — Progress Notes (Signed)
MD notified regarding scheduled lovenox & patient going to surgery tomorrow morning. Lovenox held until after surgery, SCD's ordered. SCD's placed on pt, Nursing will continue to monitor.

## 2016-12-23 ENCOUNTER — Inpatient Hospital Stay (HOSPITAL_COMMUNITY): Payer: Medicare Other

## 2016-12-23 ENCOUNTER — Inpatient Hospital Stay (HOSPITAL_COMMUNITY): Payer: Medicare Other | Admitting: Certified Registered Nurse Anesthetist

## 2016-12-23 ENCOUNTER — Encounter (HOSPITAL_COMMUNITY)
Admission: EM | Disposition: A | Discharge status: Skilled Nursing Facility | Payer: Self-pay | Source: Home / Self Care | Attending: Internal Medicine

## 2016-12-23 ENCOUNTER — Encounter (HOSPITAL_COMMUNITY): Payer: Self-pay | Admitting: Certified Registered Nurse Anesthetist

## 2016-12-23 DIAGNOSIS — S72141A Displaced intertrochanteric fracture of right femur, initial encounter for closed fracture: Secondary | ICD-10-CM | POA: Diagnosis present

## 2016-12-23 HISTORY — PX: INTRAMEDULLARY (IM) NAIL INTERTROCHANTERIC: SHX5875

## 2016-12-23 LAB — CBC WITH DIFFERENTIAL/PLATELET
BASOS ABS: 0 10*3/uL (ref 0.0–0.1)
BASOS PCT: 0 %
EOS ABS: 0.1 10*3/uL (ref 0.0–0.7)
Eosinophils Relative: 0 %
HCT: 34.4 % — ABNORMAL LOW (ref 36.0–46.0)
HEMOGLOBIN: 10.7 g/dL — AB (ref 12.0–15.0)
Lymphocytes Relative: 10 %
Lymphs Abs: 1.5 10*3/uL (ref 0.7–4.0)
MCH: 29 pg (ref 26.0–34.0)
MCHC: 31.1 g/dL (ref 30.0–36.0)
MCV: 93.2 fL (ref 78.0–100.0)
MONOS PCT: 11 %
Monocytes Absolute: 1.6 10*3/uL — ABNORMAL HIGH (ref 0.1–1.0)
NEUTROS ABS: 11.1 10*3/uL — AB (ref 1.7–7.7)
Neutrophils Relative %: 79 %
Platelets: 192 10*3/uL (ref 150–400)
RBC: 3.69 MIL/uL — ABNORMAL LOW (ref 3.87–5.11)
RDW: 13.9 % (ref 11.5–15.5)
WBC: 14.3 10*3/uL — ABNORMAL HIGH (ref 4.0–10.5)

## 2016-12-23 LAB — BASIC METABOLIC PANEL
Anion gap: 6 (ref 5–15)
BUN: 8 mg/dL (ref 6–20)
CALCIUM: 8.5 mg/dL — AB (ref 8.9–10.3)
CO2: 25 mmol/L (ref 22–32)
CREATININE: 0.62 mg/dL (ref 0.44–1.00)
Chloride: 107 mmol/L (ref 101–111)
Glucose, Bld: 119 mg/dL — ABNORMAL HIGH (ref 65–99)
Potassium: 3.7 mmol/L (ref 3.5–5.1)
SODIUM: 138 mmol/L (ref 135–145)

## 2016-12-23 SURGERY — FIXATION, FRACTURE, INTERTROCHANTERIC, WITH INTRAMEDULLARY ROD
Anesthesia: Spinal | Site: Hip | Laterality: Right

## 2016-12-23 MED ORDER — KETAMINE HCL 10 MG/ML IJ SOLN
INTRAMUSCULAR | Status: DC | PRN
  Administered 2016-12-23: 30 mg via INTRAVENOUS

## 2016-12-23 MED ORDER — ACETAMINOPHEN 650 MG RE SUPP: 650.0000 mg | Freq: Four times a day (QID) | RECTAL | Status: DC | PRN

## 2016-12-23 MED ORDER — EPHEDRINE SULFATE 50 MG/ML IJ SOLN
INTRAMUSCULAR | Status: DC | PRN
  Administered 2016-12-23: 5 mg via INTRAVENOUS

## 2016-12-23 MED ORDER — KETAMINE HCL-SODIUM CHLORIDE 100-0.9 MG/10ML-% IV SOSY
PREFILLED_SYRINGE | INTRAVENOUS | Status: AC
  Filled 2016-12-23: qty 10

## 2016-12-23 MED ORDER — FENTANYL CITRATE (PF) 250 MCG/5ML IJ SOLN
INTRAMUSCULAR | Status: AC
  Filled 2016-12-23: qty 5

## 2016-12-23 MED ORDER — PHENOL 1.4 % MT LIQD
1.0000 | OROMUCOSAL | Status: DC | PRN
  Filled 2016-12-23: qty 177

## 2016-12-23 MED ORDER — LACTATED RINGERS IV SOLN
INTRAVENOUS | Status: DC
  Administered 2016-12-23 (×2): via INTRAVENOUS

## 2016-12-23 MED ORDER — 0.9 % SODIUM CHLORIDE (POUR BTL) OPTIME
TOPICAL | Status: DC | PRN
  Administered 2016-12-23: 1000 mL

## 2016-12-23 MED ORDER — FESOTERODINE FUMARATE ER 8 MG PO TB24
8.0000 mg | ORAL_TABLET | Freq: Every day | ORAL | Status: DC
  Administered 2016-12-24 – 2016-12-27 (×4): 8 mg via ORAL
  Filled 2016-12-23 (×4): qty 1

## 2016-12-23 MED ORDER — ENOXAPARIN SODIUM 40 MG/0.4ML ~~LOC~~ SOLN
40.0000 mg | SUBCUTANEOUS | Status: DC
  Administered 2016-12-24 – 2016-12-27 (×4): 40 mg via SUBCUTANEOUS
  Filled 2016-12-23 (×4): qty 0.4

## 2016-12-23 MED ORDER — CLINDAMYCIN PHOSPHATE 600 MG/50ML IV SOLN
600.0000 mg | Freq: Four times a day (QID) | INTRAVENOUS | Status: AC
  Administered 2016-12-23 (×2): 600 mg via INTRAVENOUS
  Filled 2016-12-23 (×2): qty 50

## 2016-12-23 MED ORDER — PHENYLEPHRINE HCL 10 MG/ML IJ SOLN
INTRAVENOUS | Status: DC | PRN
  Administered 2016-12-23: 25 ug/min via INTRAVENOUS

## 2016-12-23 MED ORDER — ACETAMINOPHEN 325 MG PO TABS
650.0000 mg | ORAL_TABLET | Freq: Four times a day (QID) | ORAL | Status: DC | PRN
  Administered 2016-12-24 – 2016-12-26 (×4): 650 mg via ORAL
  Filled 2016-12-23 (×4): qty 2

## 2016-12-23 MED ORDER — PHENYLEPHRINE HCL 10 MG/ML IJ SOLN
INTRAMUSCULAR | Status: DC | PRN
  Administered 2016-12-23: 40 ug via INTRAVENOUS
  Administered 2016-12-23 (×2): 80 ug via INTRAVENOUS
  Administered 2016-12-23: 40 ug via INTRAVENOUS
  Administered 2016-12-23: 120 ug via INTRAVENOUS

## 2016-12-23 MED ORDER — BUPIVACAINE HCL (PF) 0.5 % IJ SOLN
INTRAMUSCULAR | Status: DC | PRN
  Administered 2016-12-23: 3 mL via INTRATHECAL

## 2016-12-23 MED ORDER — MENTHOL 3 MG MT LOZG: 1.0000 | LOZENGE | OROMUCOSAL | Status: DC | PRN

## 2016-12-23 MED ORDER — PROPOFOL 500 MG/50ML IV EMUL
INTRAVENOUS | Status: DC | PRN
  Administered 2016-12-23: 25 ug/kg/min via INTRAVENOUS

## 2016-12-23 SURGICAL SUPPLY — 51 items
ALCOHOL ISOPROPYL (RUBBING) (MISCELLANEOUS) ×3 IMPLANT
BIT DRILL 4.3MMS DISTAL GRDTED (BIT) ×1 IMPLANT
BNDG COHESIVE 4X5 TAN STRL (GAUZE/BANDAGES/DRESSINGS) IMPLANT
CHLORAPREP W/TINT 26ML (MISCELLANEOUS) ×3 IMPLANT
COVER PERINEAL POST (MISCELLANEOUS) ×3 IMPLANT
COVER SURGICAL LIGHT HANDLE (MISCELLANEOUS) ×3 IMPLANT
DERMABOND ADVANCED (GAUZE/BANDAGES/DRESSINGS) ×2
DERMABOND ADVANCED .7 DNX12 (GAUZE/BANDAGES/DRESSINGS) ×1 IMPLANT
DRAPE C-ARM 42X72 X-RAY (DRAPES) ×3 IMPLANT
DRAPE C-ARMOR (DRAPES) ×3 IMPLANT
DRAPE HALF SHEET 40X57 (DRAPES) ×3 IMPLANT
DRAPE IMP U-DRAPE 54X76 (DRAPES) ×6 IMPLANT
DRAPE STERI IOBAN 125X83 (DRAPES) ×3 IMPLANT
DRAPE U-SHAPE 47X51 STRL (DRAPES) ×6 IMPLANT
DRAPE UNIVERSAL PACK (DRAPES) ×3 IMPLANT
DRILL 4.3MMS DISTAL GRADUATED (BIT) ×3
DRSG MEPILEX BORDER 4X4 (GAUZE/BANDAGES/DRESSINGS) ×9 IMPLANT
DRSG PAD ABDOMINAL 8X10 ST (GAUZE/BANDAGES/DRESSINGS) IMPLANT
ELECT REM PT RETURN 9FT ADLT (ELECTROSURGICAL) ×3
ELECTRODE REM PT RTRN 9FT ADLT (ELECTROSURGICAL) ×1 IMPLANT
FACESHIELD WRAPAROUND (MASK) ×3 IMPLANT
GLOVE BIO SURGEON STRL SZ8.5 (GLOVE) ×6 IMPLANT
GLOVE BIOGEL PI IND STRL 8.5 (GLOVE) ×1 IMPLANT
GLOVE BIOGEL PI INDICATOR 8.5 (GLOVE) ×2
GLOVE SURG SS PI 8.0 STRL IVOR (GLOVE) ×3 IMPLANT
GOWN STRL REUS W/ TWL LRG LVL3 (GOWN DISPOSABLE) ×2 IMPLANT
GOWN STRL REUS W/TWL 2XL LVL3 (GOWN DISPOSABLE) ×3 IMPLANT
GOWN STRL REUS W/TWL LRG LVL3 (GOWN DISPOSABLE) ×4
GUIDEPIN 3.2X17.5 THRD DISP (PIN) ×3 IMPLANT
GUIDEWIRE BALL NOSE 80CM (WIRE) ×3 IMPLANT
HFN LAG SCREW 10.5MM X 85MM (Screw) ×3 IMPLANT
HFN RH 130 DEG 9MM X 320MM (Nail) ×3 IMPLANT
KIT ROOM TURNOVER OR (KITS) ×3 IMPLANT
MANIFOLD NEPTUNE II (INSTRUMENTS) ×3 IMPLANT
MARKER SKIN DUAL TIP RULER LAB (MISCELLANEOUS) ×3 IMPLANT
NS IRRIG 1000ML POUR BTL (IV SOLUTION) ×3 IMPLANT
PACK GENERAL/GYN (CUSTOM PROCEDURE TRAY) ×3 IMPLANT
PACK UNIVERSAL I (CUSTOM PROCEDURE TRAY) ×3 IMPLANT
PAD ARMBOARD 7.5X6 YLW CONV (MISCELLANEOUS) ×6 IMPLANT
PADDING CAST ABS 4INX4YD NS (CAST SUPPLIES)
PADDING CAST ABS COTTON 4X4 ST (CAST SUPPLIES) IMPLANT
SCREW BONE CORTICAL 5.0X3 (Screw) ×3 IMPLANT
SCREWDRIVER HEX TIP 3.5MM (MISCELLANEOUS) ×3 IMPLANT
SUT MNCRL AB 3-0 PS2 18 (SUTURE) ×3 IMPLANT
SUT MNCRL AB 3-0 PS2 27 (SUTURE) ×3 IMPLANT
SUT MON AB 2-0 CT1 27 (SUTURE) ×3 IMPLANT
SUT MON AB 2-0 CT1 36 (SUTURE) ×3 IMPLANT
SUT VIC AB 1 CT1 27 (SUTURE) ×2
SUT VIC AB 1 CT1 27XBRD ANBCTR (SUTURE) ×1 IMPLANT
TOWEL OR 17X24 6PK STRL BLUE (TOWEL DISPOSABLE) ×3 IMPLANT
TOWEL OR 17X26 10 PK STRL BLUE (TOWEL DISPOSABLE) ×3 IMPLANT

## 2016-12-23 NOTE — Anesthesia Postprocedure Evaluation (Signed)
Anesthesia Post Note  Patient: Charleen Kirksileen D Visconti  Procedure(s) Performed: Procedure(s) (LRB): RIGHT INTRAMEDULLARY (IM) NAIL INTERTROCHANTRIC (Right)  Patient location during evaluation: PACU Anesthesia Type: Spinal Level of consciousness: awake and alert Pain management: pain level controlled Vital Signs Assessment: post-procedure vital signs reviewed and stable Respiratory status: spontaneous breathing and respiratory function stable Cardiovascular status: blood pressure returned to baseline and stable Postop Assessment: no headache, no backache and spinal receding Anesthetic complications: no       Last Vitals:  Vitals:   12/23/16 1245 12/23/16 1300  BP: (!) 98/40 (!) 82/68  Pulse: 77 72  Resp: (!) 21 17  Temp: 36.3 C     Last Pain:  Vitals:   12/23/16 1245  TempSrc:   PainSc: Asleep                 Phillips Groutarignan, Dominick Morella

## 2016-12-23 NOTE — Anesthesia Procedure Notes (Signed)
Spinal  Patient location during procedure: OR Staffing Anesthesiologist: Montez Hageman Performed: anesthesiologist  Preanesthetic Checklist Completed: patient identified, site marked, surgical consent, pre-op evaluation, timeout performed, IV checked, risks and benefits discussed and monitors and equipment checked Spinal Block Patient position: right lateral decubitus Prep: Betadine Patient monitoring: heart rate, continuous pulse ox and blood pressure Injection technique: single-shot Needle Needle type: Spinocan  Needle gauge: 22 G Needle length: 9 cm Additional Notes Expiration date of kit checked and confirmed. Patient tolerated procedure well, without complications.

## 2016-12-23 NOTE — Progress Notes (Signed)
PROGRESS NOTE    Brianna Reynolds  NFA:[id number] DOB: 07/16/1924 DOA: 12/21/2016 PCP: Sherren MochaShaw, Eva N, MD    Brief Narrative:  83 yo female presented with the chief complain of a fall. Patient known to have HTN, dementia, recurrent C. Diff and hypothyroidism. Sustained mechanical fall at home. Post trauma complained of sever pain at the right proximal leg, unable to ambulate or bear weight. No head trauma or loss of consciousness. On the initial physical examination, blood pressure 121/72 with heart rate 85 with respiratory rate 18 and oxygen saturation 94%. Mucous membranes were moist, lungs clear to auscultation, rhythmic S1 and S2, abdomen soft, no lower extremity edema. Positive deformity on the proximal thigh with decreases range of motion due to pain. Imaging studies, with right comminuted displaced femoral fracture. Admitted to the medical unit, placed on analgesic therapy and consulted orthopedic service. Surgical procedure paned for 05/25.    Assessment & Plan:   Principal Problem:   Closed right hip fracture [facility](HCC) Active Problems:   Hypothyroid   Leukocytosis   Fall at home, initial encounter   Dementia   1. Right comminuted displaced femur fracture.  Pain control with hydrocodone and morphine. DVT prophylaxis. For orthopedic procedure today.    2. Leukocytosis. Trending down to 14 from 15, no signs of systemic infection, will continue to hold on antibiotic therapy for now.   3. Anxiety. Continue clonazepam, melatonin, quetiapine and sertraline.   4. Hypothyroidism. On levothyroxine.   5. Dementia. On donepezil.   6. Dislipidemia. On atorvastatin.   DVT prophylaxis: enoxaparin  Code Status: Full  Family Communication: Patient's family at the bedside and all questions were addressed.  Disposition Plan:    Consultants:   Orthopedics  Procedures:     Antimicrobials:    Subjective: Patient with no active pain, no dyspnea or chest pain, no nausea or  vomiting. Incentive spirometer last night.   Objective: Vitals:   12/22/16 0430 12/22/16 1500 12/22/16 2051 12/23/16 0615  BP: (!) 141/46 100/80 122/89 (!) 123/47  Pulse: 78 74 95 81  Resp: 18 18 18    Temp: 99.1 F (37.3 C) 98.6 F (37 C) 99.3 F (37.4 C) 97.9 F (36.6 C)  TempSrc: Oral Oral Oral Axillary  SpO2: 94% 98% 93% 99%    Intake/Output Summary (Last 24 hours) at 12/23/16 0815 Last data filed at 12/23/16 0336  Gross per 24 hour  Intake           1872.5 ml  Output                0 ml  Net           1872.5 ml   There were no vitals filed for this visit.  Examination:  General exam: deconditioned E ENT: no pallor or icterus  Respiratory system: Clear to auscultation. Respiratory effort normal. No wheezing, rales or rhonchi.  Cardiovascular system: S1 & S2 heard, RRR. No JVD, murmurs, rubs, gallops or clicks. No pedal edema. Gastrointestinal system: Abdomen is nondistended, soft and nontender. No organomegaly or masses felt. Normal bowel sounds heard. Central nervous system: Alert and oriented. No focal neurological deficits. Extremities: Symmetric 5 x 5 power. Right lower extremity shortening.  Skin: No rashes, lesions or ulcers     Data Reviewed: I have personally reviewed following labs and imaging studies  CBC:  Recent Labs Lab 12/21/16 2348 12/23/16 0554  WBC 15.0* 14.3*  NEUTROABS  --  11.1*  HGB 12.8 10.7*  HCT 39.9 34.4*  MCV 92.1 93.2  PLT 244 192   Basic Metabolic Panel:  Recent Labs Lab 12/21/16 2348 12/23/16 0554  NA 138 138  K 3.8 3.7  CL 107 107  CO2 23 25  GLUCOSE 126* 119*  BUN 16 8  CREATININE 0.87 0.62  CALCIUM 8.7* 8.5*   GFR: Estimated Creatinine Clearance: 35.3 mL/min (by C-G formula based on SCr of 0.62 mg/dL). Liver Function Tests:  Recent Labs Lab 12/21/16 2348  AST 16  ALT 9*  ALKPHOS 67  BILITOT 0.6  PROT 6.1*  ALBUMIN 3.4*   No results for input(s): LIPASE, AMYLASE in the last 168 hours. No results for  input(s): AMMONIA in the last 168 hours. Coagulation Profile:  Recent Labs Lab 12/21/16 2348  INR 0.91   Cardiac Enzymes: No results for input(s): CKTOTAL, CKMB, CKMBINDEX, TROPONINI in the last 168 hours. BNP (last 3 results) No results for input(s): PROBNP in the last 8760 hours. HbA1C: No results for input(s): HGBA1C in the last 72 hours. CBG: No results for input(s): GLUCAP in the last 168 hours. Lipid Profile: No results for input(s): CHOL, HDL, LDLCALC, TRIG, CHOLHDL, LDLDIRECT in the last 72 hours. Thyroid Function Tests: No results for input(s): TSH, T4TOTAL, FREET4, T3FREE, THYROIDAB in the last 72 hours. Anemia Panel: No results for input(s): VITAMINB12, FOLATE, FERRITIN, TIBC, IRON, RETICCTPCT in the last 72 hours. Sepsis Labs: No results for input(s): PROCALCITON, LATICACIDVEN in the last 168 hours.  Recent Results (from the past 240 hour(s))  Urine culture     Status: None   Collection Time: 12/16/16  2:17 PM  Result Value Ref Range Status   Urine Culture, Routine Final report  Final   Urine Culture result 1 Comment  Final    Comment: Mixed urogenital flora Less than 10,000 colonies/mL   Surgical pcr screen     Status: None   Collection Time: 12/22/16  4:36 AM  Result Value Ref Range Status   MRSA, PCR NEGATIVE NEGATIVE Final   Staphylococcus aureus NEGATIVE NEGATIVE Final    Comment:        The Xpert SA Assay (FDA approved for NASAL specimens in patients over 70 years of age), is one component of a comprehensive surveillance program.  Test performance has been validated by [facility] for patients greater than or equal to 59 year old. It is not intended to diagnose infection nor to guide or monitor treatment.          Radiology Studies: Dg Chest 1 View  Result Date: 12/22/2016 CLINICAL DATA:  Fall with hip fracture EXAM: CHEST 1 VIEW COMPARISON:  06/12/2014 FINDINGS: Mild cardiomegaly without edema. Aortic atherosclerosis. No pneumothorax.  Minimal basilar atelectasis. IMPRESSION: 1. Minimal basilar atelectasis.  No acute infiltrate or edema 2. Cardiomegaly without overt failure. Electronically Signed   By: Jasmine Pang M.D.   On: 12/22/2016 00:08   Dg Hip Unilat  With Pelvis 2-3 Views Right  Result Date: 12/22/2016 CLINICAL DATA:  Fall while walking to the bathroom EXAM: DG HIP (WITH OR WITHOUT PELVIS) 2-3V RIGHT COMPARISON:  CT 05/18/2014 FINDINGS: Right greater than left SI joint arthritis. The pubic symphysis appears intact. Comminuted intertrochanteric fracture with varus angulation and superior migration of the distal femoral fragment. Close to 1/2 shaft diameter of anterior displacement of distal fracture fragment on the lateral view. Displaced lesser trochanteric fracture fragment. IMPRESSION: Comminuted displaced and slightly angulated right intertrochanteric fracture. Electronically Signed   By: Jasmine Pang M.D.   On: 12/22/2016 00:06  Dg Femur Min 2 Views Right  Result Date: 12/22/2016 CLINICAL DATA:  Fall with pain EXAM: RIGHT FEMUR 2 VIEWS COMPARISON:  None. FINDINGS: Pubic symphysis appears intact. Right femoral head projects in joint. Comminuted and displaced proximal right femur fracture. Distal femur is intact. Degenerative changes at the right knee. Vascular calcifications. IMPRESSION: Comminuted and displaced proximal femoral fracture. Distal femur appears intact. Electronically Signed   By: Jasmine Pang M.D.   On: 12/22/2016 00:07        Scheduled Meds: . acidophilus  1 capsule Oral Daily  . atorvastatin  10 mg Oral q1800  . chlorhexidine  60 mL Topical Once  . donepezil  10 mg Oral QHS  . levothyroxine  75 mcg Oral QAC breakfast  . Melatonin  4.5 mg Oral QHS  . povidone-iodine  2 application Topical Once  . sertraline  75 mg Oral Daily   Continuous Infusions: . sodium chloride 75 mL/hr at 12/22/16 0238  .  ceFAZolin (ANCEF) IV    . methocarbamol (ROBAXIN)  IV       LOS: 1 day         Kelaiah Escalona Annett Gula, MD [facility] Pager [phone]  If 7PM-7AM, please contact night-coverage [email or web address] Password [email or web address] 12/23/2016, 8:15 AM

## 2016-12-23 NOTE — Interval H&P Note (Signed)
History and Physical Interval Note:  12/23/2016 9:45 AM  Brianna KirksAileen D Reynolds  has presented today for surgery, with the diagnosis of right hip fracture  The various methods of treatment have been discussed with the patient and family. After consideration of risks, benefits and other options for treatment, the patient has consented to  Procedure(s): INTRAMEDULLARY (IM) NAIL INTERTROCHANTRIC (Right) as a surgical intervention .  The patient's history has been reviewed, patient examined, no change in status, stable for surgery.  I have reviewed the patient's chart and labs.  Questions were answered to the patient's satisfaction.    The risks, benefits, and alternatives were discussed with the patient. There are risks associated with the surgery including, but not limited to, problems with anesthesia (death), infection, differences in leg length/angulation/rotation, fracture of bones, loosening or failure of implants, malunion, nonunion, hematoma (blood accumulation) which may require surgical drainage, blood clots, pulmonary embolism, nerve injury (foot drop), and blood vessel injury. The patient understands these risks and elects to proceed.   Jasson Siegmann, Cloyde ReamsBrian James

## 2016-12-23 NOTE — H&P (View-Only) (Signed)
Reason for Consult:Right intertroch hip fx Referring Physician: Peterson Ao is an 83 y.o. female.  HPI: Brianna Reynolds was home last night getting ready for bed. She turned the lights off and then slipped on something and fell. She had immediate pain in her right hip. A neighbor brought them to [facility] where x-rays showed a right intertroch hip fx. Orthopedic surgery was consulted and requested she be transferred to [facility] to undergo surgery on Friday.  Past Medical History:  Diagnosis Date  . Abnormal EKG   . Anxiety   . C. difficile colitis   . Cataract   . Fatigue   . HTN (hypertension)    New  . Hyperlipidemia   . Hypothyroid   . Memory loss   . Pneumonia     Past Surgical History:  Procedure Laterality Date  . BREAST SURGERY Bilateral   . COLONOSCOPY    . COLONOSCOPY N/A 10/27/2014   Procedure: COLONOSCOPY;  Surgeon: Gatha Mayer, MD;  Location: [facility];  Service: Endoscopy;  Laterality: N/A;  . PARATHYROIDECTOMY    . THYROIDECTOMY, PARTIAL    . TONSILLECTOMY      Family History  Problem Relation Age of Onset  . Arthritis Mother     Social History:  reports that she quit smoking about 25 years ago. Her smoking use included Cigarettes. She has never used smokeless tobacco. She reports that she does not drink alcohol or use drugs.  Allergies:  Allergies  Allergen Reactions  . Penicillins Rash    Medications: I have reviewed the patient's current medications.  Results for orders placed or performed during the hospital encounter of 12/21/16 (from the past 48 hour(s))  CBC     Status: Abnormal   Collection Time: 12/21/16 11:48 PM  Result Value Ref Range   WBC 15.0 (H) 4.0 - 10.5 K/uL   RBC 4.33 3.87 - 5.11 MIL/uL   Hemoglobin 12.8 12.0 - 15.0 g/dL   HCT 39.9 36.0 - 46.0 %   MCV 92.1 78.0 - 100.0 fL   MCH 29.6 26.0 - 34.0 pg   MCHC 32.1 30.0 - 36.0 g/dL   RDW 13.7 11.5 - 15.5 %   Platelets 244 150 - 400 K/uL  Comprehensive metabolic panel     Status:  Abnormal   Collection Time: 12/21/16 11:48 PM  Result Value Ref Range   Sodium 138 135 - 145 mmol/L   Potassium 3.8 3.5 - 5.1 mmol/L   Chloride 107 101 - 111 mmol/L   CO2 23 22 - 32 mmol/L   Glucose, Bld 126 (H) 65 - 99 mg/dL   BUN 16 6 - 20 mg/dL   Creatinine, Ser 0.87 0.44 - 1.00 mg/dL   Calcium 8.7 (L) 8.9 - 10.3 mg/dL   Total Protein 6.1 (L) 6.5 - 8.1 g/dL   Albumin 3.4 (L) 3.5 - 5.0 g/dL   AST 16 15 - 41 U/L   ALT 9 (L) 14 - 54 U/L   Alkaline Phosphatase 67 38 - 126 U/L   Total Bilirubin 0.6 0.3 - 1.2 mg/dL   GFR calc non Af Amer 57 (L) >60 mL/min   GFR calc Af Amer >60 >60 mL/min    Comment: (NOTE) The eGFR has been calculated using the CKD EPI equation. This calculation has not been validated in all clinical situations. eGFR's persistently <60 mL/min signify possible Chronic Kidney Disease.    Anion gap 8 5 - 15  Protime-INR     Status: None  Collection Time: 12/21/16 11:48 PM  Result Value Ref Range   Prothrombin Time 12.2 11.4 - 15.2 seconds   INR 0.91   Type and screen [employer]     Status: None   Collection Time: 12/21/16 11:48 PM  Result Value Ref Range   ABO/RH(D) AB POS    Antibody Screen NEG    Sample Expiration 12/24/2016   ABO/Rh     Status: None   Collection Time: 12/22/16 12:01 AM  Result Value Ref Range   ABO/RH(D) AB POS   Surgical pcr screen     Status: None   Collection Time: 12/22/16  4:36 AM  Result Value Ref Range   MRSA, PCR NEGATIVE NEGATIVE   Staphylococcus aureus NEGATIVE NEGATIVE    Comment:        The Xpert SA Assay (FDA approved for NASAL specimens in patients over 82 years of age), is one component of a comprehensive surveillance program.  Test performance has been validated by [facility] for patients greater than or equal to 75 year old. It is not intended to diagnose infection nor to guide or monitor treatment.   Type and screen [place]     Status: None   Collection Time:  12/22/16  6:34 AM  Result Value Ref Range   ABO/RH(D) AB POS    Antibody Screen NEG    Sample Expiration 12/25/2016   ABO/Rh     Status: None   Collection Time: 12/22/16  6:34 AM  Result Value Ref Range   ABO/RH(D) AB POS     Dg Chest 1 View  Result Date: 12/22/2016 CLINICAL DATA:  Fall with hip fracture EXAM: CHEST 1 VIEW COMPARISON:  06/12/2014 FINDINGS: Mild cardiomegaly without edema. Aortic atherosclerosis. No pneumothorax. Minimal basilar atelectasis. IMPRESSION: 1. Minimal basilar atelectasis.  No acute infiltrate or edema 2. Cardiomegaly without overt failure. Electronically Signed   By: Donavan Foil M.D.   On: 12/22/2016 00:08   Dg Hip Unilat  With Pelvis 2-3 Views Right  Result Date: 12/22/2016 CLINICAL DATA:  Fall while walking to the bathroom EXAM: DG HIP (WITH OR WITHOUT PELVIS) 2-3V RIGHT COMPARISON:  CT 05/18/2014 FINDINGS: Right greater than left SI joint arthritis. The pubic symphysis appears intact. Comminuted intertrochanteric fracture with varus angulation and superior migration of the distal femoral fragment. Close to 1/2 shaft diameter of anterior displacement of distal fracture fragment on the lateral view. Displaced lesser trochanteric fracture fragment. IMPRESSION: Comminuted displaced and slightly angulated right intertrochanteric fracture. Electronically Signed   By: Donavan Foil M.D.   On: 12/22/2016 00:06   Dg Femur Min 2 Views Right  Result Date: 12/22/2016 CLINICAL DATA:  Fall with pain EXAM: RIGHT FEMUR 2 VIEWS COMPARISON:  None. FINDINGS: Pubic symphysis appears intact. Right femoral head projects in joint. Comminuted and displaced proximal right femur fracture. Distal femur is intact. Degenerative changes at the right knee. Vascular calcifications. IMPRESSION: Comminuted and displaced proximal femoral fracture. Distal femur appears intact. Electronically Signed   By: Donavan Foil M.D.   On: 12/22/2016 00:07    Review of Systems  Constitutional: Negative  for weight loss.  HENT: Negative for ear discharge, ear pain, hearing loss and tinnitus.   Eyes: Negative for blurred vision, double vision, photophobia and pain.  Respiratory: Negative for cough, sputum production and shortness of breath.   Cardiovascular: Negative for chest pain.  Gastrointestinal: Negative for abdominal pain, nausea and vomiting.  Genitourinary: Negative for dysuria, flank pain, frequency and urgency.  Musculoskeletal: Positive for joint pain (Right hip'). Negative for back pain, falls, myalgias and neck pain.  Neurological: Negative for dizziness, tingling, sensory change, focal weakness, loss of consciousness and headaches.  Endo/Heme/Allergies: Does not bruise/bleed easily.  Psychiatric/Behavioral: Negative for depression, memory loss and substance abuse. The patient is not nervous/anxious.    Blood pressure (!) 141/46, pulse 78, temperature 99.1 F (37.3 C), temperature source Oral, resp. rate 18, SpO2 94 %. Physical Exam  Constitutional: She appears well-developed and well-nourished. No distress.  HENT:  Head: Normocephalic.  Eyes: Conjunctivae are normal. Right eye exhibits no discharge. Left eye exhibits no discharge. No scleral icterus.  Cardiovascular: Normal rate and regular rhythm.   Respiratory: Effort normal. No respiratory distress.  Musculoskeletal:  Bilateral shoulder, elbow, wrist, digits- no skin wounds, nontender, no instability, no blocks to motion  Sens  Ax/R/M/U intact  Mot   Ax/ R/ PIN/ M/ AIN/ U intact  Rad 2+  RLE No traumatic wounds, ecchymosis, or rash  TTP hip  No effusions  Knee stable to varus/ valgus and anterior/posterior stress  Sens DPN, SPN, TN intact  Motor EHL, ext, flex, evers 5/5  DP 2+, PT 0, No significant edema   LLE No traumatic wounds, ecchymosis, or rash  Nontender  No effusions  Knee stable to varus/ valgus and anterior/posterior stress  Sens DPN, SPN, TN intact  Motor EHL, ext, flex, evers 5/5  DP 2+, PT 0, No  significant edema  Neurological: She is alert.  E/o dementia with repeated questions, some perseverance  Skin: Skin is warm and dry. She is not diaphoretic.  Psychiatric: She has a normal mood and affect. Her behavior is normal.    Assessment/Plan: Fall Right intertrochanteric femur fx -- For IM nail tomorrow morning by Dr. Lyla Glassing. NPO after MN. Multiple medical problems -- per primary service VTE -- Lovenox    Brianna Abu, PA-C Orthopedic Surgery [phone] 12/22/2016, 9:10 AM

## 2016-12-23 NOTE — Transfer of Care (Signed)
Immediate Anesthesia Transfer of Care Note  Patient: Brianna Reynolds  Procedure(s) Performed: Procedure(s): RIGHT INTRAMEDULLARY (IM) NAIL INTERTROCHANTRIC (Right)  Patient Location: PACU  Anesthesia Type:MAC and Spinal  Level of Consciousness: awake, alert , patient cooperative and responds to stimulation  Airway & Oxygen Therapy: Patient Spontanous Breathing  Post-op Assessment: Report given to RN and Post -op Vital signs reviewed and stable  Post vital signs: Reviewed and stable  Last Vitals:  Vitals:   12/23/16 0615 12/23/16 0917  BP: (!) 123/47 (!) 118/44  Pulse: 81 82  Resp:  18  Temp: 36.6 C 36.8 C    Last Pain:  Vitals:   12/23/16 0917  TempSrc: Axillary  PainSc:       Patients Stated Pain Goal: 2 (38/33/38 3291)  Complications: No apparent anesthesia complications

## 2016-12-23 NOTE — Anesthesia Procedure Notes (Signed)
Procedure Name: MAC Date/Time: 12/23/2016 11:00 AM Performed by: Tressia Miners LEFFEW Pre-anesthesia Checklist: Patient identified, Emergency Drugs available, Suction available, Timeout performed and Patient being monitored Patient Re-evaluated:Patient Re-evaluated prior to inductionOxygen Delivery Method: Simple face mask Placement Confirmation: positive ETCO2

## 2016-12-23 NOTE — Op Note (Signed)
OPERATIVE REPORT  SURGEON: Samson FredericBrian Lashawnda Hancox, MD   ASSISTANT: Hart CarwinJustin Queen, RNFA.  PREOPERATIVE DIAGNOSIS: Right intertrochanteric femur fracture.   POSTOPERATIVE DIAGNOSIS: Right intertrochanteric femur fracture.   PROCEDURE: Cephalomedullary fixation, Right femur.   IMPLANTS: Biomet Affixus Hip Fracture Nail, 9 by 320 mm, 130 degrees. 10.5 x 85 mm lag screw. 5 mm distal interlocking screw 1.  ANESTHESIA:  Spinal  ESTIMATED BLOOD LOSS: 150 mL.  ANTIBIOTICS: 2 g Ancef.  DRAINS: None.  COMPLICATIONS: None.   CONDITION: PACU - hemodynamically stable.   BRIEF CLINICAL NOTE: Brianna Reynolds is a 83 y.o. female who presented with an intertrochanteric femur fracture. The patient was admitted to the hospitalist service and underwent perioperative risk stratification and medical optimization. The risks, benefits, and alternatives to the procedure were explained, and the patient elected to proceed.  PROCEDURE IN DETAIL: Surgical site was marked by myself. The patient was taken to the operating room and anesthesia was induced on the bed. [facility] and the nonoperative lower extremity was scissored underneath the operative side. The fracture was reduced with traction, internal rotation, and adduction. The fracture was a comminuted reverse obliquity pattern.The hip was prepped and draped in the normal sterile surgical fashion. Timeout was called verifying side and site of surgery. Preop antibiotics were given with 60 minutes of beginning the procedure.  Fluoroscopy was used to define the patient's anatomy. A 4 cm incision was made just proximal to the tip of the greater trochanter. The awl was used to obtain the standard starting point for a trochanteric entry nail under fluoroscopic control. The guidepin was placed. The entry reamer was used to open the proximal femur.  I placed the guidewire to the level of the physeal scar of the knee. I measured the  length of the guidewire. Sequential reaming was performed up to a size 10.5 mm with excellent chatter. Therefore, a size 9 by 320 mm nail was selected and assembled to the jig on the back table. The nail was placed without any difficulty. Through a separate stab incision, the cannula was placed down to the bone in preparation for the cephalomedullary device. A guidepin was placed into the femoral head using AP and lateral fluoroscopy views. The pin was measured, and then reaming was performed to the appropriate depth. The lag screw was inserted to the appropriate depth. The setscrew was tightened. Using perfect circle technique, a distal interlocking screw was placed. The jig was removed. Final AP and lateral fluoroscopy views were obtained to confirm fracture reduction and hardware placement. Tip apex distance was appropriate. There was no chondral penetration.  The wounds were copiously irrigated with saline. The wound was closed in layers with #1 Vicryl for the fascia, 2-0 Monocryl for the deep dermal layer, and 3-0 Monocryl subcuticular stitch. Glue was applied to the skin. Once the glue was fully hardened, sterile dressing was applied. The patient was then awakened from anesthesia and taken to the PACU in stable condition. Sponge needle and instrument counts were correct at the end of the case 2. There were no known complications.  We will readmit the patient to the hospitalist. Weightbearing status will be touchdown weightbearing with a walker. We will begin Lovenox for DVT prophylaxis. The patient will work with physical therapy and undergo disposition planning.

## 2016-12-23 NOTE — Anesthesia Preprocedure Evaluation (Signed)
Anesthesia Evaluation  Patient identified by MRN, date of birth, ID band Patient awake    Reviewed: Allergy & Precautions, NPO status , Patient's Chart, lab work & pertinent test results  Airway Mallampati: II  TM Distance: >3 FB Neck ROM: Full    Dental no notable dental hx.    Pulmonary former smoker,    Pulmonary exam normal breath sounds clear to auscultation       Cardiovascular hypertension, Pt. on medications Normal cardiovascular exam Rhythm:Regular Rate:Normal     Neuro/Psych dementia negative psych ROS   GI/Hepatic negative GI ROS, Neg liver ROS,   Endo/Other  Hypothyroidism   Renal/GU negative Renal ROS  negative genitourinary   Musculoskeletal negative musculoskeletal ROS (+)   Abdominal   Peds negative pediatric ROS (+)  Hematology negative hematology ROS (+)   Anesthesia Other Findings   Reproductive/Obstetrics negative OB ROS                             Anesthesia Physical Anesthesia Plan  ASA: II  Anesthesia Plan: Spinal   Post-op Pain Management:    Induction:   Airway Management Planned: Simple Face Mask  Additional Equipment:   Intra-op Plan:   Post-operative Plan:   Informed Consent: I have reviewed the patients History and Physical, chart, labs and discussed the procedure including the risks, benefits and alternatives for the proposed anesthesia with the patient or authorized representative who has indicated his/her understanding and acceptance.   Dental advisory given  Plan Discussed with: CRNA  Anesthesia Plan Comments: (Spinal)        Anesthesia Quick Evaluation

## 2016-12-23 NOTE — Discharge Instructions (Signed)
Dr. Samson FredericBrian Jaquelinne Glendening Adult Hip & Knee Specialist [facility] [address] [place], [place] [address] [phone]   POSTOPERATIVE DIRECTIONS    Hip Rehabilitation, Guidelines Following Surgery   WEIGHT BEARING Partial weight bearing with assist device as directed.  Touch Down Weight Bearing (30%)   HOME CARE INSTRUCTIONS  Remove items at home which could result in a fall. This includes throw rugs or furniture in walking pathways.  Continue medications as instructed at time of discharge.  You may have some home medications which will be placed on hold until you complete the course of blood thinner medication.  4 days after discharge, you may start showering. No tub baths or soaking your incisions. Do not put on socks or shoes without following the instructions of your caregivers.   Sit on chairs with arms. Use the chair arms to help push yourself up when arising.  Arrange for the use of a toilet seat elevator so you are not sitting low.   Walk with walker as instructed.  You may resume a sexual relationship in one month or when given the OK by your caregiver.  Use walker as long as suggested by your caregivers.  Avoid periods of inactivity such as sitting longer than an hour when not asleep. This helps prevent blood clots.  You may return to work once you are cleared by Designer, industrial/productyour surgeon.  Do not drive a car for 6 weeks or until released by your surgeon.  Do not drive while taking narcotics.  Wear elastic stockings for two weeks following surgery during the day but you may remove then at night.  Make sure you keep all of your appointments after your operation with all of your doctors and caregivers. You should call the office at the above phone number and make an appointment for approximately two weeks after the date of your surgery. Please pick up a stool softener and laxative for home use as long as you are requiring pain medications.  ICE to the affected  hip every three hours for 30 minutes at a time and then as needed for pain and swelling. Continue to use ice on the hip for pain and swelling from surgery. You may notice swelling that will progress down to the foot and ankle.  This is normal after surgery.  Elevate the leg when you are not up walking on it.   It is important for you to complete the blood thinner medication as prescribed by your doctor.  Continue to use the breathing machine which will help keep your temperature down.  It is common for your temperature to cycle up and down following surgery, especially at night when you are not up moving around and exerting yourself.  The breathing machine keeps your lungs expanded and your temperature down.  RANGE OF MOTION AND STRENGTHENING EXERCISES  These exercises are designed to help you keep full movement of your hip joint. Follow your caregiver's or physical therapist's instructions. Perform all exercises about fifteen times, three times per day or as directed. Exercise both hips, even if you have had only one joint replacement. These exercises can be done on a training (exercise) mat, on the floor, on a table or on a bed. Use whatever works the best and is most comfortable for you. Use music or television while you are exercising so that the exercises are a pleasant break in your day. This will make your life better with the exercises acting as a break in routine you  can look forward to.  °Lying on your back, slowly slide your foot toward your buttocks, raising your knee up off the floor. Then slowly slide your foot back down until your leg is straight again.  °Lying on your back spread your legs as far apart as you can without causing discomfort.  °Lying on your side, raise your upper leg and foot straight up from the floor as far as is comfortable. Slowly lower the leg and repeat.  °Lying on your back, tighten up the muscle in the front of your thigh (quadriceps muscles). You can do this by keeping  your leg straight and trying to raise your heel off the floor. This helps strengthen the largest muscle supporting your knee.  °Lying on your back, tighten up the muscles of your buttocks both with the legs straight and with the knee bent at a comfortable angle while keeping your heel on the floor.  ° °SKILLED REHAB INSTRUCTIONS: °If the patient is transferred to a skilled rehab facility following release from the hospital, a list of the current medications will be sent to the facility for the patient to continue.  When discharged from the skilled rehab facility, please have the facility set up the patient's Home Health Physical Therapy prior to being released. Also, the skilled facility will be responsible for providing the patient with their medications at time of release from the facility to include their pain medication and their blood thinner medication. If the patient is still at the rehab facility at time of the two week follow up appointment, the skilled rehab facility will also need to assist the patient in arranging follow up appointment in our office and any transportation needs. ° °MAKE SURE YOU:  °Understand these instructions.  °Will watch your condition.  °Will get help right away if you are not doing well or get worse. ° °Pick up stool softner and laxative for home use following surgery while on pain medications. °Daily dry dressing changes as needed. °In 4 days, you may remove your dressings and begin taking showers - no tub baths or soaking the incisions. °Continue to use ice for pain and swelling after surgery. °Do not use any lotions or creams on the incision until instructed by your surgeon. ° ° °

## 2016-12-24 LAB — CBC WITH DIFFERENTIAL/PLATELET
BASOS ABS: 0 10*3/uL (ref 0.0–0.1)
BASOS PCT: 0 %
EOS ABS: 0.1 10*3/uL (ref 0.0–0.7)
Eosinophils Relative: 1 %
HEMATOCRIT: 25.5 % — AB (ref 36.0–46.0)
HEMOGLOBIN: 8.1 g/dL — AB (ref 12.0–15.0)
Lymphocytes Relative: 17 %
Lymphs Abs: 2 10*3/uL (ref 0.7–4.0)
MCH: 29.2 pg (ref 26.0–34.0)
MCHC: 31.8 g/dL (ref 30.0–36.0)
MCV: 92.1 fL (ref 78.0–100.0)
Monocytes Absolute: 1.7 10*3/uL — ABNORMAL HIGH (ref 0.1–1.0)
Monocytes Relative: 14 %
NEUTROS ABS: 8.3 10*3/uL — AB (ref 1.7–7.7)
NEUTROS PCT: 69 %
Platelets: 157 10*3/uL (ref 150–400)
RBC: 2.77 MIL/uL — AB (ref 3.87–5.11)
RDW: 14 % (ref 11.5–15.5)
WBC: 12.1 10*3/uL — AB (ref 4.0–10.5)

## 2016-12-24 LAB — BASIC METABOLIC PANEL
ANION GAP: 4 — AB (ref 5–15)
BUN: 11 mg/dL (ref 6–20)
CHLORIDE: 107 mmol/L (ref 101–111)
CO2: 24 mmol/L (ref 22–32)
CREATININE: 0.72 mg/dL (ref 0.44–1.00)
Calcium: 7.9 mg/dL — ABNORMAL LOW (ref 8.9–10.3)
GFR calc non Af Amer: >60 mL/min (ref >60)
Glucose, Bld: 122 mg/dL — ABNORMAL HIGH (ref 65–99)
POTASSIUM: 3.9 mmol/L (ref 3.5–5.1)
SODIUM: 135 mmol/L (ref 135–145)

## 2016-12-24 NOTE — Evaluation (Signed)
Physical Therapy Evaluation Patient Details Name: Brianna Reynolds MRN: [id number] DOB: 07/13/1924 Today's Date: 12/24/2016   History of Present Illness  Patient is a 83 year old female S/P IM nail of the right hip. Per patient baseline mobility was good. PMH: fatigue, memory loss, c-diff, HTN, cataract,    Clinical Impression  Patient requires significant assistance at this time for mobility. She has stairs to get in her house and stairs to get to her bedroom. She requires total assist to transfer and is unable to walk. She is able to use her left leg for some mobility tasks but she is limited. She would benefit from further acute therapy to address the above deficits as well as therapy at a SNF     Follow Up Recommendations SNF    Equipment Recommendations   (Patient likely going to SNF)    Recommendations for Other Services Rehab consult     Precautions / Restrictions Precautions Precautions: Fall Restrictions Weight Bearing Restrictions: Yes RLE Weight Bearing: Partial weight bearing RLE Partial Weight Bearing Percentage or Pounds: 30      Mobility  Bed Mobility Overal bed mobility: Needs Assistance Bed Mobility: Supine to Sit     Supine to sit: Max assist;+2 for physical assistance;HOB elevated     General bed mobility comments: Pt utilized bed rails. Required Max A to manage BLE and trunk  Transfers Overall transfer level: Needs assistance   Transfers: Squat Pivot Transfers     Squat pivot transfers: Total assist;+2 physical assistance     General transfer comment: Therapist squat pivoted patient to the chiar while OT assisted with keeping weight bearing status. She required mod cuing for hand  placement. She was unable to come to a full standing position.   Ambulation/Gait                Stairs            Wheelchair Mobility    Modified Rankin (Stroke Patients Only)       Balance Overall balance assessment: Needs  assistance Sitting-balance support: Bilateral upper extremity supported Sitting balance-Leahy Scale: Poor Sitting balance - Comments: required assist to remain balanced. Heavy lean to the left.  Postural control: Posterior lean;Left lateral lean                                   Pertinent Vitals/Pain Pain Assessment: 0-10 Pain Score: 8  Pain Location: R leg Pain Descriptors / Indicators: Constant;Discomfort;Guarding;Grimacing Pain Intervention(s): Limited activity within patient's tolerance    Home Living Family/patient expects to be discharged to:: Private residence Living Arrangements: Spouse/significant other Available Help at Discharge: Family Type of Home: House Home Access: Stairs to enter   Secretary/administratorntrance Stairs-Number of Steps: 5 Home Layout: Two level;Bed/bath upstairs        Prior Function Level of Independence: Independent         Comments: Patient reported sitll driving. Son reported to therapist mobility was starting to become limited.      Hand Dominance        Extremity/Trunk Assessment   Upper Extremity Assessment Upper Extremity Assessment: Defer to OT evaluation    Lower Extremity Assessment Lower Extremity Assessment: RLE deficits/detail;LLE deficits/detail RLE: Unable to fully assess due to pain LLE: Unable to fully assess due to pain       Communication   Communication: No difficulties  Cognition Arousal/Alertness: Awake/alert Behavior During Therapy: [facility] for tasks  assessed/performed;Anxious Overall Cognitive Status: History of cognitive impairments - at baseline                                 General Comments: Per family patient had memory deficits at baseline       General Comments General comments (skin integrity, edema, etc.): pt's husband and son present for session    Exercises     Assessment/Plan    PT Assessment Patient needs continued PT services  PT Problem List Decreased strength;Decreased  range of motion;Decreased activity tolerance;Decreased mobility;Decreased safety awareness;Other (comment);Decreased knowledge of precautions       PT Treatment Interventions DME instruction;Gait training;Stair training;Functional mobility training;Therapeutic activities;Therapeutic exercise;Patient/family education    PT Goals (Current goals can be found in the Care Plan section)  Acute Rehab PT Goals Patient Stated Goal: Stop pain PT Goal Formulation: With patient/family Time For Goal Achievement: 01/07/17 Potential to Achieve Goals: Good    Frequency Min 5X/week   Barriers to discharge Inaccessible home environment will likely be discharged to a SNF for rehab     Co-evaluation PT/OT/SLP Co-Evaluation/Treatment: Yes Reason for Co-Treatment: Complexity of the patient's impairments (multi-system involvement);For patient/therapist safety;To address functional/ADL transfers PT goals addressed during session: Mobility/safety with mobility;Proper use of DME;Strengthening/ROM OT goals addressed during session: ADL's and self-care       AM-PAC PT "6 Clicks" Daily Activity  Outcome Measure Difficulty turning over in bed (including adjusting bedclothes, sheets and blankets)?: A Lot Difficulty moving from lying on back to sitting on the side of the bed? : A Lot Difficulty sitting down on and standing up from a chair with arms (e.g., wheelchair, bedside commode, etc,.)?: A Lot Help needed moving to and from a bed to chair (including a wheelchair)?: Total Help needed walking in hospital room?: Total Help needed climbing 3-5 steps with a railing? : Total 6 Click Score: 9    End of Session Equipment Utilized During Treatment: Gait belt Activity Tolerance: Patient limited by pain Patient left: in chair;with call bell/phone within reach;with family/visitor present Nurse Communication: Mobility status;Weight bearing status PT Visit Diagnosis: Unsteadiness on feet (R26.81);Difficulty in  walking, not elsewhere classified (R26.2);Pain Pain - Right/Left: Right Pain - part of body: Hip    Time: 6045-4098 PT Time Calculation (min) (ACUTE ONLY): 18 min   Charges:   PT Evaluation $PT Eval Low Complexity: 1 Procedure     PT G Codes:        Dessie Coma PT DPT  12/24/2016, 1:18 PM

## 2016-12-24 NOTE — Evaluation (Signed)
Occupational Therapy Evaluation Patient Details Name: Brianna Reynolds MRN: [id number] DOB: [birth year] Today's Date: 12/24/2016    History of Present Illness Patient is a 83 year old female S/P IM nail of the right hip. Per patient baseline mobility was good. PMH: fatigue, memory loss, c-diff, HTN, cataract,     Clinical Impression   PTA, pt was living with her husband and reports to be independent. Son reports that pt has had recent decline in memory. Currently, pt requires Max A for LB ADLs and total A for squat pivot transfer due to increased pain and fear of falling. Pt would benefit from continued OT to increase her occupational performance and participation. Recommend dc to SNF for further OT to increase her safety and independence with ADLs and functional mobility.     Follow Up Recommendations  SNF;Supervision/Assistance - 24 hour    Equipment Recommendations  Other (comment) (Defer to next venue)    Recommendations for Other Services       Precautions / Restrictions Precautions Precautions: Fall Restrictions Weight Bearing Restrictions: Yes RLE Weight Bearing: Partial weight bearing RLE Partial Weight Bearing Percentage or Pounds: 30      Mobility Bed Mobility Overal bed mobility: Needs Assistance Bed Mobility: Supine to Sit     Supine to sit: Max assist;+2 for physical assistance;HOB elevated     General bed mobility comments: Pt utilized bed rails. Required Max A to manage BLE and trunk  Transfers Overall transfer level: Needs assistance   Transfers: Squat Pivot Transfers     Squat pivot transfers: Total assist;+2 physical assistance     General transfer comment: Therapist squat pivoted patient to the chiar while OT assisted with keeping weight bearing status. She required mod cuing for hand  placement. She was unable to come to a full standing position.     Balance Overall balance assessment: Needs assistance Sitting-balance support: Bilateral upper  extremity supported Sitting balance-Leahy Scale: Poor Sitting balance - Comments: required assist to remain balanced. Heavy lean to the left.  Postural control: Posterior lean;Left lateral lean                                 ADL either performed or assessed with clinical judgement   ADL Overall ADL's : Needs assistance/impaired Eating/Feeding: Set up;Sitting   Grooming: Set up;Sitting   Upper Body Bathing: Sitting;Min guard   Lower Body Bathing: Sitting/lateral leans;Maximal assistance   Upper Body Dressing : Min guard;Sitting   Lower Body Dressing: Sitting/lateral leans;Maximal assistance   Toilet Transfer: Total assistance;Squat-pivot (simulated to recliner)             General ADL Comments: Pt limited by pain and fear of falling.     Vision         Perception     Praxis      Pertinent Vitals/Pain Pain Assessment: 0-10 Pain Score: 8  Pain Location: R leg Pain Descriptors / Indicators: Constant;Discomfort;Guarding;Grimacing Pain Intervention(s): Limited activity within patient's tolerance     Hand Dominance     Extremity/Trunk Assessment Upper Extremity Assessment Upper Extremity Assessment: Defer to OT evaluation   Lower Extremity Assessment Lower Extremity Assessment: RLE deficits/detail;LLE deficits/detail RLE: Unable to fully assess due to pain LLE: Unable to fully assess due to pain       Communication Communication Communication: No difficulties   Cognition Arousal/Alertness: Awake/alert Behavior During Therapy: WFL for tasks assessed/performed;Anxious Overall Cognitive Status: History of cognitive impairments -  at baseline                                 General Comments: Per family patient had memory deficits at baseline    General Comments  pt's husband and son present for session    Exercises     Shoulder Instructions      Home Living Family/patient expects to be discharged to:: Private  residence Living Arrangements: Spouse/significant other Available Help at Discharge: Family Type of Home: House Home Access: Stairs to enter Secretary/administratorntrance Stairs-Number of Steps: 5   Home Layout: Two level;Bed/bath upstairs                          Prior Functioning/Environment Level of Independence: Independent        Comments: Patient reported sitll driving. Son reported to therapist mobility was starting to become limited.         OT Problem List: Decreased strength;Decreased range of motion;Decreased activity tolerance;Impaired vision/perception;Decreased safety awareness;Decreased knowledge of use of DME or AE;Decreased knowledge of precautions;Decreased cognition;Pain      OT Treatment/Interventions: Self-care/ADL training;Therapeutic exercise;Energy conservation;DME and/or AE instruction;Therapeutic activities;Patient/family education    OT Goals(Current goals can be found in the care plan section) Acute Rehab OT Goals Patient Stated Goal: Stop pain OT Goal Formulation: With patient Time For Goal Achievement: 01/07/17 Potential to Achieve Goals: Good ADL Goals Pt Will Perform Grooming: with set-up;sitting Pt Will Perform Upper Body Dressing: with set-up;with supervision;sitting Pt Will Perform Lower Body Dressing: with adaptive equipment;sit to/from stand;with mod assist Pt Will Transfer to Toilet: with mod assist;stand pivot transfer;bedside commode Pt Will Perform Toileting - Clothing Manipulation and hygiene: with min assist;sitting/lateral leans  OT Frequency: Min 2X/week   Barriers to D/C:            Co-evaluation PT/OT/SLP Co-Evaluation/Treatment: Yes Reason for Co-Treatment: Complexity of the patient's impairments (multi-system involvement);For patient/therapist safety;To address functional/ADL transfers PT goals addressed during session: Mobility/safety with mobility;Proper use of DME;Strengthening/ROM OT goals addressed during session: ADL's and  self-care      AM-PAC PT "6 Clicks" Daily Activity     Outcome Measure Help from another person eating meals?: None Help from another person taking care of personal grooming?: A Little Help from another person toileting, which includes using toliet, bedpan, or urinal?: A Lot Help from another person bathing (including washing, rinsing, drying)?: A Lot Help from another person to put on and taking off regular upper body clothing?: A Little Help from another person to put on and taking off regular lower body clothing?: A Lot 6 Click Score: 16   End of Session Equipment Utilized During Treatment: Gait belt Nurse Communication: Mobility status;Weight bearing status  Activity Tolerance: Patient limited by pain Patient left: in chair;with call bell/phone within reach;with family/visitor present  OT Visit Diagnosis: Unsteadiness on feet (R26.81);Muscle weakness (generalized) (M62.81);History of falling (Z91.81);Pain Pain - Right/Left: Right Pain - part of body: Leg                Time: 4098-11911105-1123 OT Time Calculation (min): 18 min Charges:  OT General Charges $OT Visit: 1 Procedure OT Evaluation $OT Eval Low Complexity: 1 Procedure G-Codes:     Samayra Hebel, OTR/L [phone]  Theodoro GristCharis M Bowe Sidor 12/24/2016, 1:13 PM

## 2016-12-24 NOTE — Progress Notes (Signed)
Physical Therapy Treatment Patient Details Name: Brianna Reynolds MRN: [id number] DOB: 04-10-1924 Today's Date: 12/24/2016    History of Present Illness Patient is a 83 year old female S/P IM nail of the right hip. Per patient baseline mobility was good. PMH: fatigue, memory loss, c-diff, HTN, cataract,      PT Comments    Patient tolerated treatment better in the afternoon. She verbalized less pain and had less anxiety about her transfer. She was able to participate more with her left leg. The patient would benefit from further rehab at a SNF or inpatient rehab. Therapy will try to advance her ability to transfer using a rolling walker.    Follow Up Recommendations  SNF     Equipment Recommendations   (Patient likely going to SNF)    Recommendations for Other Services Rehab consult     Precautions / Restrictions Precautions Precautions: Fall Restrictions Weight Bearing Restrictions: Yes RLE Weight Bearing: Partial weight bearing RLE Partial Weight Bearing Percentage or Pounds: 30    Mobility  Bed Mobility Overal bed mobility: Needs Assistance Bed Mobility: Sit to Supine     Supine to sit: Max assist;+2 for physical assistance;HOB elevated     General bed mobility comments: reuqired max a+2 to get both lower extremitys into the bed.   Transfers Overall transfer level: Needs assistance   Transfers: Squat Pivot Transfers     Squat pivot transfers: Total assist;+2 physical assistance     General transfer comment: Therapy kept foot under patients foot with cuing to not step on my foot. She was able to transfer with a 2 person squat pivot transfer over to the bed without putting significant pressure into the therpaist foot.   Ambulation/Gait                 Stairs            Wheelchair Mobility    Modified Rankin (Stroke Patients Only)       Balance Overall balance assessment: Needs assistance Sitting-balance support: Bilateral upper  extremity supported Sitting balance-Leahy Scale: Poor Sitting balance - Comments: required assist to remain balanced. Heavy lean to the left.                                     Cognition Arousal/Alertness: Awake/alert Behavior During Therapy: WFL for tasks assessed/performed;Anxious Overall Cognitive Status: History of cognitive impairments - at baseline                                 General Comments: Per family patient had memory deficits at baseline       Exercises      General Comments General comments (skin integrity, edema, etc.): Patients son and husband present       Pertinent Vitals/Pain Pain Assessment: Faces Pain Score: 8  Faces Pain Scale: Hurts little more Pain Location: R leg Pain Descriptors / Indicators: Constant;Discomfort;Guarding;Grimacing Pain Intervention(s): Limited activity within patient's tolerance;Repositioned    Home Living Family/patient expects to be discharged to:: Private residence Living Arrangements: Spouse/significant other Available Help at Discharge: Family Type of Home: House Home Access: Stairs to enter   Home Layout: Two level;Bed/bath upstairs        Prior Function Level of Independence: Independent      Comments: Patient reported sitll driving. Son reported to therapist mobility was starting to become  limited.    PT Goals (current goals can now be found in the care plan section) Acute Rehab PT Goals Patient Stated Goal: Stop pain PT Goal Formulation: With patient/family Time For Goal Achievement: 01/07/17 Potential to Achieve Goals: Good    Frequency    Min 5X/week      PT Plan Current plan remains appropriate    Co-evaluation PT/OT/SLP Co-Evaluation/Treatment: Yes Reason for Co-Treatment: Complexity of the patient's impairments (multi-system involvement);For patient/therapist safety;To address functional/ADL transfers PT goals addressed during session: Mobility/safety with  mobility;Proper use of DME;Strengthening/ROM        AM-PAC PT "6 Clicks" Daily Activity  Outcome Measure  Difficulty turning over in bed (including adjusting bedclothes, sheets and blankets)?: A Lot Difficulty moving from lying on back to sitting on the side of the bed? : A Lot Difficulty sitting down on and standing up from a chair with arms (e.g., wheelchair, bedside commode, etc,.)?: A Lot Help needed moving to and from a bed to chair (including a wheelchair)?: Total Help needed walking in hospital room?: Total Help needed climbing 3-5 steps with a railing? : Total 6 Click Score: 9    End of Session Equipment Utilized During Treatment: Gait belt Activity Tolerance: Patient limited by pain Patient left: in chair;with call bell/phone within reach;with family/visitor present Nurse Communication: Mobility status;Weight bearing status PT Visit Diagnosis: Unsteadiness on feet (R26.81);Difficulty in walking, not elsewhere classified (R26.2);Pain Pain - Right/Left: Right Pain - part of body: Hip     Time: 1250-1300 PT Time Calculation (min) (ACUTE ONLY): 10 min  Charges:                       G Codes:          Dessie Coma PT DPT  12/24/2016, 4:11 PM

## 2016-12-24 NOTE — Progress Notes (Signed)
   Subjective:  Patient reports pain as mild to moderate.  Patient at her baseline level of dementia.  Her husband and son provide information.  They state she is comfrtable.  Objective:   VITALS:   Vitals:   12/23/16 2343 12/23/16 2345 12/24/16 0514 12/24/16 0955  BP: (!) 104/34 (!) 105/30 (!) 119/47 (!) 117/45  Pulse: 84  80 89  Resp:    (!) 28  Temp: 99 F (37.2 C)  98.8 F (37.1 C) 98.7 F (37.1 C)  TempSrc: Oral  Oral Axillary  SpO2: 99%  98% 98%   RLE Intact pulses distally Dorsiflexion/Plantar flexion intact Incision: dressing C/D/I No cellulitis present Compartment soft   Lab Results  Component Value Date   WBC 12.1 (H) 12/24/2016   HGB 8.1 (L) 12/24/2016   HCT 25.5 (L) 12/24/2016   MCV 92.1 12/24/2016   PLT 157 12/24/2016   BMET    Component Value Date/Time   NA 135 12/24/2016 0336   NA 138 08/04/2016 1518   K 3.9 12/24/2016 0336   CL 107 12/24/2016 0336   CO2 24 12/24/2016 0336   GLUCOSE 122 (H) 12/24/2016 0336   BUN 11 12/24/2016 0336   BUN 12 08/04/2016 1518   CREATININE 0.72 12/24/2016 0336   CREATININE 0.76 01/20/2016 1523   CALCIUM 7.9 (L) 12/24/2016 0336   CALCIUM 10.6 (H) 03/14/2012 1317   GFRNONAA >60 12/24/2016 0336   GFRNONAA 70 01/20/2016 1523   GFRAA >60 12/24/2016 0336   GFRAA 80 01/20/2016 1523     Assessment/Plan: 1 Day Post-Op   Principal Problem:   Closed right hip fracture (HCC) Active Problems:   Hypothyroid   Leukocytosis   Fall at home, initial encounter   Dementia   Closed comminuted intertrochanteric fracture of proximal end of right femur (HCC)   Advance diet Up with therapy Lovenox for DVT ppx with SCDs TDWB RLE Likely needs inpt rehab post hospitalization    Yolonda KidaJason Patrick Kiegan Macaraeg 12/24/2016, 2:02 PM   Maryan RuedJason P Danzell Birky, MD [phone]

## 2016-12-24 NOTE — Progress Notes (Signed)
PROGRESS NOTE    Brianna Reynolds  ZOX:[id number] DOB: Sep 19, 1923 DOA: 12/21/2016 PCP: Sherren Mocha, MD     Brief Narrative:  83 yo female presented with the chief complain of a fall. Patient known to have HTN, dementia, recurrent C. Diff and hypothyroidism. Sustained mechanical fall at home. Post trauma complained of sever pain at the right proximal leg, unable to ambulate or bear weight. No head trauma or loss of consciousness. On the initial physical examination, blood pressure 121/72 with heart rate 85 with respiratory rate 18 and oxygen saturation 94%. Mucous membranes were moist, lungs clear to auscultation, rhythmic S1 and S2, abdomen soft, no lower extremity edema. Positive deformity on the proximal thigh with decreases range of motion due to pain. Imaging studies, with right comminuted displaced femoral fracture. Admitted to the medical unit, placed on analgesic therapy and consulted orthopedic service. Surgical procedure 05/25, cephalomedullary fixation, Right femur.    Assessment & Plan:   Principal Problem:   Closed right hip fracture [facility]) Active Problems:   Hypothyroid   Leukocytosis   Fall at home, initial encounter   Dementia   Closed comminuted intertrochanteric fracture of proximal end of right femur (HCC)   1. Right comminuted displaced femur fracture.  Pain well controlled with hydrocodone and morphine IV. DVT prophylaxis. Patient tolerated orthopedic procedure well. Follow on orthopedic recommendations, physical therapy evaluation in preparation for discharge.   2. Leukocytosis. Wbc at 12, no signs of infection, likely reactive leukocytosis. No fevers.   3. Anxiety. Patient will continue clonazepam, melatonin, quetiapine and sertraline, per her home regimen.   4. Hypothyroidism. continue levothyroxine.   5. Dementia. Continue donepezil. No agitation.   6. Dislipidemia. Continue atorvastatin.   DVT prophylaxis:enoxaparin  Code Status:Full  Family  Communication:Patient's family at the bedside and all questions were addressed.  Disposition Plan:   Consultants:  Orthopedics  Procedures: 05/25, cephalomedullary fixation, Right femur.     Antimicrobials   Subjective: Patient had surgical procedure yesterday, pain controlled. No nausea or vomiting, no dyspnea or chest pain.   Objective: Vitals:   12/23/16 2000 12/23/16 2343 12/23/16 2345 12/24/16 0514  BP: (!) 110/40 (!) 104/34 (!) 105/30 (!) 119/47  Pulse: 89 84  80  Resp:      Temp: (!) 100.5 F (38.1 C) 99 F (37.2 C)  98.8 F (37.1 C)  TempSrc: Oral Oral  Oral  SpO2: 98% 99%  98%    Intake/Output Summary (Last 24 hours) at 12/24/16 0908 Last data filed at 12/23/16 1800  Gross per 24 hour  Intake             2470 ml  Output              350 ml  Net             2120 ml   There were no vitals filed for this visit.  Examination:  General exam: deconditioned, not in pain or dyspnea E ENT. No pallor or icterus, oral mucosa moist Respiratory system: Clear to auscultation. Respiratory effort normal. No wheezing, rales or rhonchi.  Cardiovascular system: S1 & S2 heard, RRR. No JVD, murmurs, rubs, gallops or clicks. No pedal edema. Gastrointestinal system: Abdomen is nondistended, soft and nontender. No organomegaly or masses felt. Normal bowel sounds heard. Central nervous system: Alert and oriented. No focal neurological deficits. Extremities: Symmetric 5 x 5 power. Skin: No rashes, lesions or ulcers   Data Reviewed: I have personally reviewed following labs and imaging studies  CBC:  Recent Labs Lab 12/21/16 2348 12/23/16 0554 12/24/16 0336  WBC 15.0* 14.3* 12.1*  NEUTROABS  --  11.1* 8.3*  HGB 12.8 10.7* 8.1*  HCT 39.9 34.4* 25.5*  MCV 92.1 93.2 92.1  PLT 244 192 157   Basic Metabolic Panel:  Recent Labs Lab 12/21/16 2348 12/23/16 0554 12/24/16 0336  NA 138 138 135  K 3.8 3.7 3.9  CL 107 107 107  CO2 23 25 24   GLUCOSE 126* 119*  122*  BUN 16 8 11   CREATININE 0.87 0.62 0.72  CALCIUM 8.7* 8.5* 7.9*   GFR: Estimated Creatinine Clearance: 35.3 mL/min (by C-G formula based on SCr of 0.72 mg/dL). Liver Function Tests:  Recent Labs Lab 12/21/16 2348  AST 16  ALT 9*  ALKPHOS 67  BILITOT 0.6  PROT 6.1*  ALBUMIN 3.4*   No results for input(s): LIPASE, AMYLASE in the last 168 hours. No results for input(s): AMMONIA in the last 168 hours. Coagulation Profile:  Recent Labs Lab 12/21/16 2348  INR 0.91   Cardiac Enzymes: No results for input(s): CKTOTAL, CKMB, CKMBINDEX, TROPONINI in the last 168 hours. BNP (last 3 results) No results for input(s): PROBNP in the last 8760 hours. HbA1C: No results for input(s): HGBA1C in the last 72 hours. CBG: No results for input(s): GLUCAP in the last 168 hours. Lipid Profile: No results for input(s): CHOL, HDL, LDLCALC, TRIG, CHOLHDL, LDLDIRECT in the last 72 hours. Thyroid Function Tests: No results for input(s): TSH, T4TOTAL, FREET4, T3FREE, THYROIDAB in the last 72 hours. Anemia Panel: No results for input(s): VITAMINB12, FOLATE, FERRITIN, TIBC, IRON, RETICCTPCT in the last 72 hours. Sepsis Labs: No results for input(s): PROCALCITON, LATICACIDVEN in the last 168 hours.  Recent Results (from the past 240 hour(s))  Urine culture     Status: None   Collection Time: 12/16/16  2:17 PM  Result Value Ref Range Status   Urine Culture, Routine Final report  Final   Urine Culture result 1 Comment  Final    Comment: Mixed urogenital flora Less than 10,000 colonies/mL   Surgical pcr screen     Status: None   Collection Time: 12/22/16  4:36 AM  Result Value Ref Range Status   MRSA, PCR NEGATIVE NEGATIVE Final   Staphylococcus aureus NEGATIVE NEGATIVE Final    Comment:        The Xpert SA Assay (FDA approved for NASAL specimens in patients over 81 years of age), is one component of a comprehensive surveillance program.  Test performance has been validated by  [facility] for patients greater than or equal to 81 year old. It is not intended to diagnose infection nor to guide or monitor treatment.          Radiology Studies: Pelvis Portable  Result Date: 12/23/2016 CLINICAL DATA:  Postop EXAM: PORTABLE PELVIS 1-2 VIEWS COMPARISON:  Intraoperative fluoroscopic images dated 12/23/2016 FINDINGS: Status post ORIF of an intertrochanteric right hip fracture. IM rod with dynamic hip screw fixation. Mildly displaced lesser trochanter fragment. Mild degenerative changes of the right hip. Left hip joint space is preserved. Visualized bony pelvis appears intact. Mild degenerative changes of the lower lumbar spine. IMPRESSION: Status post ORIF of the right hip, as above. Electronically Signed   By: Charline BillsSriyesh  Krishnan M.D.   On: 12/23/2016 16:54   Dg C-arm 1-60 Min  Result Date: 12/23/2016 CLINICAL DATA:  Internal fixation EXAM: RIGHT FEMUR 2 VIEWS; DG C-ARM 61-120 MIN COMPARISON:  12/21/2016 FINDINGS: Internal fixation across the right femoral  intertrochanteric fracture. Intramedullary nail in place with dynamic hip screw and single locking distal screw. Near anatomic alignment. No hardware complicating feature. IMPRESSION: Internal fixation.  No visible complicating feature. Electronically Signed   By: Charlett Nose M.D.   On: 12/23/2016 12:58   Dg Femur, Min 2 Views Right  Result Date: 12/23/2016 CLINICAL DATA:  Internal fixation EXAM: RIGHT FEMUR 2 VIEWS; DG C-ARM 61-120 MIN COMPARISON:  12/21/2016 FINDINGS: Internal fixation across the right femoral intertrochanteric fracture. Intramedullary nail in place with dynamic hip screw and single locking distal screw. Near anatomic alignment. No hardware complicating feature. IMPRESSION: Internal fixation.  No visible complicating feature. Electronically Signed   By: Charlett Nose M.D.   On: 12/23/2016 12:58   Dg Femur Port, Min 2 Views Right  Result Date: 12/23/2016 CLINICAL DATA:  Postop reduction of proximal  right femur fracture. EXAM: RIGHT FEMUR PORTABLE 2 VIEW COMPARISON:  Radiographs 12/21/2016 and intraoperative radiographs earlier today. FINDINGS: Status post intramedullary nail and dynamic hip screw placement with a distal interlocking screw. The hardware is well positioned. There is near anatomic reduction of the comminuted intratrochanteric and subtrochanteric femur fracture. No complications identified. IMPRESSION: Near anatomic reduction of proximal femur fracture. No demonstrated complication. Electronically Signed   By: Carey Bullocks M.D.   On: 12/23/2016 16:55        Scheduled Meds: . acidophilus  1 capsule Oral Daily  . atorvastatin  10 mg Oral q1800  . donepezil  10 mg Oral QHS  . enoxaparin (LOVENOX) injection  40 mg Subcutaneous Q24H  . fesoterodine  8 mg Oral Daily  . levothyroxine  75 mcg Oral QAC breakfast  . Melatonin  4.5 mg Oral QHS  . sertraline  75 mg Oral Daily   Continuous Infusions: . sodium chloride 75 mL/hr at 12/22/16 0238  . methocarbamol (ROBAXIN)  IV       LOS: 2 days        Mauricio Annett Gula, MD [facility] Pager [phone]  If 7PM-7AM, please contact night-coverage [email or web address] Password [email or web address] 12/24/2016, 9:08 AM

## 2016-12-25 DIAGNOSIS — D649 Anemia, unspecified: Secondary | ICD-10-CM

## 2016-12-25 LAB — TYPE AND SCREEN
ABO/RH(D): AB POS
Antibody Screen: NEGATIVE

## 2016-12-25 LAB — CBC
HCT: 22.4 % — ABNORMAL LOW (ref 36.0–46.0)
Hemoglobin: 7.1 g/dL — ABNORMAL LOW (ref 12.0–15.0)
MCH: 29.1 pg (ref 26.0–34.0)
MCHC: 31.7 g/dL (ref 30.0–36.0)
MCV: 91.8 fL (ref 78.0–100.0)
PLATELETS: 177 10*3/uL (ref 150–400)
RBC: 2.44 MIL/uL — ABNORMAL LOW (ref 3.87–5.11)
RDW: 14 % (ref 11.5–15.5)
WBC: 12.2 10*3/uL — ABNORMAL HIGH (ref 4.0–10.5)

## 2016-12-25 LAB — BASIC METABOLIC PANEL
Anion gap: 6 (ref 5–15)
BUN: 12 mg/dL (ref 6–20)
CALCIUM: 8.1 mg/dL — AB (ref 8.9–10.3)
CO2: 23 mmol/L (ref 22–32)
CREATININE: 0.69 mg/dL (ref 0.44–1.00)
Chloride: 106 mmol/L (ref 101–111)
GFR calc Af Amer: >60 mL/min (ref >60)
GLUCOSE: 122 mg/dL — AB (ref 65–99)
Potassium: 3.5 mmol/L (ref 3.5–5.1)
SODIUM: 135 mmol/L (ref 135–145)

## 2016-12-25 LAB — PREPARE RBC (CROSSMATCH)

## 2016-12-25 MED ORDER — POLYETHYLENE GLYCOL 3350 17 G PO PACK
17.0000 g | PACK | Freq: Two times a day (BID) | ORAL | Status: DC
  Administered 2016-12-25 – 2016-12-27 (×5): 17 g via ORAL
  Filled 2016-12-25 (×4): qty 1

## 2016-12-25 MED ORDER — SODIUM CHLORIDE 0.9 % IV SOLN
Freq: Once | INTRAVENOUS | Status: AC
  Administered 2016-12-25: 13:00:00 via INTRAVENOUS

## 2016-12-25 NOTE — Progress Notes (Signed)
Physical Therapy Treatment Patient Details Name: Brianna Reynolds MRN: [id number] DOB: 1923-09-05 Today's Date: 12/25/2016    History of Present Illness Patient is a 83 year old female S/P IM nail of the right hip. Per patient baseline mobility was good. PMH: fatigue, memory loss, c-diff, HTN, cataract,      PT Comments    Pt resistant to therapy at 1st but agreeable to participate with encouragement.  States she hurts and feels weak.  Hgb 7.1 per chart review.  RN ok'd proceeding with therapy session and transferring to recliner.  Pt requires max encouragement for increased participation with mobility.  Required max assist for supine to sitting EOB, and total assist for bed>recliner transfer.  Will cont to follow acutely.       Follow Up Recommendations  SNF     Equipment Recommendations       Recommendations for Other Services Rehab consult     Precautions / Restrictions Precautions Precautions: Fall Restrictions RLE Weight Bearing: Partial weight bearing RLE Partial Weight Bearing Percentage or Pounds: 30    Mobility  Bed Mobility Overal bed mobility: Needs Assistance Bed Mobility: Supine to Sit     Supine to sit: Max assist;HOB elevated     General bed mobility comments: Max directional cues.  (A) to lift shoulders/trunk to sitting upright, RLE management, and use of draw pad to pivot hips around to EOB.    Transfers Overall transfer level: Needs assistance   Transfers: Stand Pivot Transfers Sit to Stand: Max assist Stand pivot transfers: Max assist;Total assist       General transfer comment: Cues for sequencing and technique.  used face to face positioning to transfer pt bed>recliner with pt requiring total assist for all components of transfer.    Ambulation/Gait                 Stairs            Wheelchair Mobility    Modified Rankin (Stroke Patients Only)       Balance                                             Cognition Arousal/Alertness: Awake/alert Behavior During Therapy: WFL for tasks assessed/performed;Anxious Overall Cognitive Status: History of cognitive impairments - at baseline                                 General Comments: Per family patient had memory deficits at baseline       Exercises General Exercises - Lower Extremity Ankle Circles/Pumps: AROM;Both;10 reps Heel Slides: AAROM;Strengthening;Right;10 reps Hip ABduction/ADduction: AAROM;Strengthening;Right;10 reps Straight Leg Raises: AAROM;Strengthening;Right;10 reps    General Comments        Pertinent Vitals/Pain Pain Assessment: No/denies pain Pain Score: 8  Pain Location: R leg Pain Descriptors / Indicators: Discomfort;Grimacing;Guarding;Constant Pain Intervention(s): Limited activity within patient's tolerance;Monitored during session;Repositioned;Ice applied    Home Living                      Prior Function            PT Goals (current goals can now be found in the care plan section) Acute Rehab PT Goals Patient Stated Goal: Stop pain PT Goal Formulation: With patient/family Time For Goal Achievement: 01/07/17 Potential to Achieve Goals:  Good    Frequency    Min 5X/week      PT Plan Current plan remains appropriate    Co-evaluation              AM-PAC PT "6 Clicks" Daily Activity  Outcome Measure                   End of Session Equipment Utilized During Treatment: Gait belt Activity Tolerance: No increased pain Patient left: in chair;with call bell/phone within reach;with family/visitor present Nurse Communication: Mobility status;Weight bearing status       Time: [id number] PT Time Calculation (min) (ACUTE ONLY): 33 min  Charges:  $Therapeutic Exercise: 8-22 mins $Therapeutic Activity: 8-22 mins                    G CodesVerdell Face:       Vania Rosero, [place] [phone] 12/25/2016    Lara Mulchooper, Jesilyn Easom Lynn 12/25/2016, 11:58 AM

## 2016-12-25 NOTE — Progress Notes (Signed)
Subjective: 2 Days Post-Op Procedure(s) (LRB): RIGHT INTRAMEDULLARY (IM) NAIL INTERTROCHANTRIC (Right) Patient reports pain as moderate.   Has reflux this AM and had episode of vomiting Feels weak  Objective: Vital signs in last 24 hours: Temp:  [98.6 F (37 C)-99.1 F (37.3 C)] 98.7 F (37.1 C) (05/27 0458) Pulse Rate:  [77-91] 82 (05/27 0458) Resp:  [20-28] 20 (05/26 1447) BP: (101-128)/(44-47) 128/47 (05/27 0458) SpO2:  [96 %-100 %] 98 % (05/27 0458)  Intake/Output from previous day: 05/26 0701 - 05/27 0700 In: 480 [P.O.:480] Out: -  Intake/Output this shift: No intake/output data recorded.   Recent Labs  12/23/16 0554 12/24/16 0336 12/25/16 0315  HGB 10.7* 8.1* 7.1*    Recent Labs  12/24/16 0336 12/25/16 0315  WBC 12.1* 12.2*  RBC 2.77* 2.44*  HCT 25.5* 22.4*  PLT 157 177    Recent Labs  12/24/16 0336 12/25/16 0315  NA 135 135  K 3.9 3.5  CL 107 106  CO2 24 23  BUN 11 12  CREATININE 0.72 0.69  GLUCOSE 122* 122*  CALCIUM 7.9* 8.1*   No results for input(s): LABPT, INR in the last 72 hours.  Dorsiflexion/Plantar flexion intact No cellulitis present Compartment soft  Assessment/Plan: 2 Days Post-Op Procedure(s) (LRB): RIGHT INTRAMEDULLARY (IM) NAIL INTERTROCHANTRIC (Right) Up with therapy  Medical team to address GI complaints Has anemia with hemoglobin of 7.1 and is feeling weak. Discussed transfusion and she agrees to it  Deboraha Goar V 12/25/2016, 8:36 AM

## 2016-12-25 NOTE — Progress Notes (Signed)
PROGRESS NOTE    Brianna Reynolds  ZOX:[id number] DOB: December 30, 1923 DOA: 12/21/2016 PCP: Sherren MochaShaw, Eva N, MD    Brief Narrative:  83 yo female presented with the chief complain of a fall. Patient known to have HTN, dementia, recurrent C. Diff and hypothyroidism. Sustained mechanical fall at home. Post trauma complained of sever pain at the right proximal leg, unable to ambulate or bear weight. No head trauma or loss of consciousness. On the initial physical examination, blood pressure 121/72 with heart rate 85 with respiratory rate 18 and oxygen saturation 94%. Mucous membranes were moist, lungs clear to auscultation, rhythmic S1 and S2, abdomen soft, no lower extremity edema. Positive deformity on the proximal thigh with decreases range of motion due to pain. Imaging studies, with right comminuted displaced femoral fracture. Admitted to the medical unit, placed on analgesic therapy and consulted orthopedic service. Surgical procedure 05/25, cephalomedullary fixation, rightfemur. Patient will need SNF at discharge.    Assessment & Plan:   Principal Problem:   Closed right hip fracture [facility](HCC) Active Problems:   Hypothyroid   Leukocytosis   Fall at home, initial encounter   Dementia   Closed comminuted intertrochanteric fracture of proximal end of right femur (HCC)   1. Right comminuted displaced femur fracture. Continue pain control with hydrocodone and morphine IV. DVT prophylaxis. Will need physical SNF at discharge. Out of bed as tolerated.   2. Leukocytosis. Wbc stable at 12, no indication for antibiotic therapy.   3. Anxiety. On clonazepam, melatonin, quetiapine and sertraline, per her home regimen. Had difficulty swallowing, will follow on speech evaluation. Aspiration precautions.   4. Hypothyroidism. Onlevothyroxine, clinically euthyroid.   5. Dementia. On donepezil. No agitation.   6. Dislipidemia. On atorvastatin.   7. Acute post-operative anemia. Hb reached a critical  level of 7.1, blood transfusion ordered, 2 units, no signs of bleeding, likley multifactorial.   DVT prophylaxis:enoxaparin  Code Status:Full  Family Communication:Patient's family at the bedside and all questions were addressed.  Disposition Plan:   Consultants:  Orthopedics  Procedures: 05/25, cephalomedullary fixation, Rightfemur.     Antimicrobials   Subjective: Patient had episode of dysphagia this am, no dyspnea or chest pain, no nausea or vomiting. Working with physical therapy recommendations for SNF.   Objective: Vitals:   12/24/16 1447 12/24/16 2010 12/25/16 0458 12/25/16 0840  BP: (!) 101/44 (!) 122/46 (!) 128/47   Pulse: 91 77 82 90  Resp: 20   (!) 33  Temp: 98.6 F (37 C) 99.1 F (37.3 C) 98.7 F (37.1 C)   TempSrc: Oral Oral Oral   SpO2: 96% 100% 98% 97%    Intake/Output Summary (Last 24 hours) at 12/25/16 1058 Last data filed at 12/24/16 1300  Gross per 24 hour  Intake              240 ml  Output                0 ml  Net              240 ml   There were no vitals filed for this visit.  Examination:  General exam: deconditioned E ENT: mild pallor, no icterus. Oral mucosa moist.  Respiratory system: No wheezing, rales or rhonchi. Respiratory effort normal. Cardiovascular system: S1 & S2 heard, RRR. No JVD, murmurs, rubs, gallops or clicks. No pedal edema. Gastrointestinal system: Abdomen is nondistended, soft and nontender. No organomegaly or masses felt. Normal bowel sounds heard. Central nervous system: Alert and oriented. No  focal neurological deficits. Extremities: Symmetric 5 x 5 power. Skin: No rashes, lesions or ulcers     Data Reviewed: I have personally reviewed following labs and imaging studies  CBC:  Recent Labs Lab 12/21/16 2348 12/23/16 0554 12/24/16 0336 12/25/16 0315  WBC 15.0* 14.3* 12.1* 12.2*  NEUTROABS  --  11.1* 8.3*  --   HGB 12.8 10.7* 8.1* 7.1*  HCT 39.9 34.4* 25.5* 22.4*  MCV 92.1 93.2 92.1  91.8  PLT 244 192 157 177   Basic Metabolic Panel:  Recent Labs Lab 12/21/16 2348 12/23/16 0554 12/24/16 0336 12/25/16 0315  NA 138 138 135 135  K 3.8 3.7 3.9 3.5  CL 107 107 107 106  CO2 23 25 24 23   GLUCOSE 126* 119* 122* 122*  BUN 16 8 11 12   CREATININE 0.87 0.62 0.72 0.69  CALCIUM 8.7* 8.5* 7.9* 8.1*   GFR: Estimated Creatinine Clearance: 35.3 mL/min (by C-G formula based on SCr of 0.69 mg/dL). Liver Function Tests:  Recent Labs Lab 12/21/16 2348  AST 16  ALT 9*  ALKPHOS 67  BILITOT 0.6  PROT 6.1*  ALBUMIN 3.4*   No results for input(s): LIPASE, AMYLASE in the last 168 hours. No results for input(s): AMMONIA in the last 168 hours. Coagulation Profile:  Recent Labs Lab 12/21/16 2348  INR 0.91   Cardiac Enzymes: No results for input(s): CKTOTAL, CKMB, CKMBINDEX, TROPONINI in the last 168 hours. BNP (last 3 results) No results for input(s): PROBNP in the last 8760 hours. HbA1C: No results for input(s): HGBA1C in the last 72 hours. CBG: No results for input(s): GLUCAP in the last 168 hours. Lipid Profile: No results for input(s): CHOL, HDL, LDLCALC, TRIG, CHOLHDL, LDLDIRECT in the last 72 hours. Thyroid Function Tests: No results for input(s): TSH, T4TOTAL, FREET4, T3FREE, THYROIDAB in the last 72 hours. Anemia Panel: No results for input(s): VITAMINB12, FOLATE, FERRITIN, TIBC, IRON, RETICCTPCT in the last 72 hours. Sepsis Labs: No results for input(s): PROCALCITON, LATICACIDVEN in the last 168 hours.  Recent Results (from the past 240 hour(s))  Urine culture     Status: None   Collection Time: 12/16/16  2:17 PM  Result Value Ref Range Status   Urine Culture, Routine Final report  Final   Urine Culture result 1 Comment  Final    Comment: Mixed urogenital flora Less than 10,000 colonies/mL   Surgical pcr screen     Status: None   Collection Time: 12/22/16  4:36 AM  Result Value Ref Range Status   MRSA, PCR NEGATIVE NEGATIVE Final    Staphylococcus aureus NEGATIVE NEGATIVE Final    Comment:        The Xpert SA Assay (FDA approved for NASAL specimens in patients over 63 years of age), is one component of a comprehensive surveillance program.  Test performance has been validated by [facility] for patients greater than or equal to 8 year old. It is not intended to diagnose infection nor to guide or monitor treatment.          Radiology Studies: Pelvis Portable  Result Date: 12/23/2016 CLINICAL DATA:  Postop EXAM: PORTABLE PELVIS 1-2 VIEWS COMPARISON:  Intraoperative fluoroscopic images dated 12/23/2016 FINDINGS: Status post ORIF of an intertrochanteric right hip fracture. IM rod with dynamic hip screw fixation. Mildly displaced lesser trochanter fragment. Mild degenerative changes of the right hip. Left hip joint space is preserved. Visualized bony pelvis appears intact. Mild degenerative changes of the lower lumbar spine. IMPRESSION: Status post ORIF of the right  hip, as above. Electronically Signed   By: Charline Bills M.D.   On: 12/23/2016 16:54   Dg C-arm 1-60 Min  Result Date: 12/23/2016 CLINICAL DATA:  Internal fixation EXAM: RIGHT FEMUR 2 VIEWS; DG C-ARM 61-120 MIN COMPARISON:  12/21/2016 FINDINGS: Internal fixation across the right femoral intertrochanteric fracture. Intramedullary nail in place with dynamic hip screw and single locking distal screw. Near anatomic alignment. No hardware complicating feature. IMPRESSION: Internal fixation.  No visible complicating feature. Electronically Signed   By: Charlett Nose M.D.   On: 12/23/2016 12:58   Dg Femur, Min 2 Views Right  Result Date: 12/23/2016 CLINICAL DATA:  Internal fixation EXAM: RIGHT FEMUR 2 VIEWS; DG C-ARM 61-120 MIN COMPARISON:  12/21/2016 FINDINGS: Internal fixation across the right femoral intertrochanteric fracture. Intramedullary nail in place with dynamic hip screw and single locking distal screw. Near anatomic alignment. No hardware  complicating feature. IMPRESSION: Internal fixation.  No visible complicating feature. Electronically Signed   By: Charlett Nose M.D.   On: 12/23/2016 12:58   Dg Femur Port, Min 2 Views Right  Result Date: 12/23/2016 CLINICAL DATA:  Postop reduction of proximal right femur fracture. EXAM: RIGHT FEMUR PORTABLE 2 VIEW COMPARISON:  Radiographs 12/21/2016 and intraoperative radiographs earlier today. FINDINGS: Status post intramedullary nail and dynamic hip screw placement with a distal interlocking screw. The hardware is well positioned. There is near anatomic reduction of the comminuted intratrochanteric and subtrochanteric femur fracture. No complications identified. IMPRESSION: Near anatomic reduction of proximal femur fracture. No demonstrated complication. Electronically Signed   By: Carey Bullocks M.D.   On: 12/23/2016 16:55        Scheduled Meds: . acidophilus  1 capsule Oral Daily  . atorvastatin  10 mg Oral q1800  . donepezil  10 mg Oral QHS  . enoxaparin (LOVENOX) injection  40 mg Subcutaneous Q24H  . fesoterodine  8 mg Oral Daily  . levothyroxine  75 mcg Oral QAC breakfast  . Melatonin  4.5 mg Oral QHS  . sertraline  75 mg Oral Daily   Continuous Infusions: . sodium chloride    . methocarbamol (ROBAXIN)  IV       LOS: 3 days       Iva Posten Annett Gula, MD [facility] Pager [phone]  If 7PM-7AM, please contact night-coverage [email or web address] Password [facility] 12/25/2016, 10:58 AM

## 2016-12-25 NOTE — Evaluation (Signed)
Clinical/Bedside Swallow Evaluation Patient Details  Name: Brianna Reynolds D Beddow MRN: [id number] Date of Birth: [birth year]  Today's Date: 12/25/2016 Time: SLP Start Time (ACUTE ONLY): 1510 SLP Stop Time (ACUTE ONLY): 1520 SLP Time Calculation (min) (ACUTE ONLY): 10 min  Past Medical History:  Past Medical History:  Diagnosis Date  . Abnormal EKG   . Anxiety   . C. difficile colitis   . Cataract   . Fatigue   . HTN (hypertension)    New  . Hyperlipidemia   . Hypothyroid   . Memory loss   . Pneumonia    Past Surgical History:  Past Surgical History:  Procedure Laterality Date  . BREAST SURGERY Bilateral   . COLONOSCOPY    . COLONOSCOPY N/A 10/27/2014   Procedure: COLONOSCOPY;  Surgeon: Iva Booparl E Gessner, MD;  [facility];  Service: Endoscopy;  Laterality: N/A;  . PARATHYROIDECTOMY    . THYROIDECTOMY, PARTIAL    . TONSILLECTOMY     HPI:  Brianna Reynolds presented with the chief complaint of a fall. Patient known to have HTN, dementia, recurrent C. Diff and hypothyroidism. Sustained mechanical fall at home. No head trauma or loss of consciousness. Admitted to the medical unit, placed on analgesic therapy and consulted orthopedic service. Surgical procedure 05/25, cephalomedullary fixation, Rightfemur. CXR minimal basilar atelectasis. No acute infiltrate or edema   Assessment / Plan / Recommendation Clinical Impression  Patient presents with oropharyngeal swallow which appears within functional limits with adequate airway protection at bedside. No overt signs of aspiration noted despite challenging with multiple consecutive straw sips of thin liquid in excess of 3oz. Oral preparation, control and clearance adequate for solids. RN present and states pt found by CNA this morning to be eating in reclined position, with severe coughing and shortness of breath. She reports she monitored pt's intake during lunch as she sat upright in chair, was concerned for aspiration. Patient with  clear vocal quality, no increased work of breathing or increased coughing. Educated pt, family re: general aspiration precautions, particularly maximally upright posture when consuming PO. Given pt's history of dementia, recommend intermittent checks, particularly prior to meals to ensure pt in appropriate posture for intake. Recommend regular diet with thin liquids, medications whole with liquid. No further skilled ST follow-up recommended at this time. SLP will s/o. SLP Visit Diagnosis: Dysphagia, unspecified (R13.10)    Aspiration Risk  Mild aspiration risk    Diet Recommendation Regular;Thin liquid   Liquid Administration via: Cup;Straw Medication Administration: Whole meds with liquid Supervision: Patient able to self feed Compensations: Slow rate;Small sips/bites Postural Changes: Seated upright at 90 degrees    Other  Recommendations Oral Care Recommendations: Oral care BID   Follow up Recommendations None      Frequency and Duration min 1 x/week          Prognosis Prognosis for Safe Diet Advancement: Good      Swallow Study   General Date of Onset: 12/21/16 HPI: Brianna Reynolds presented with the chief complaint of a fall. Patient known to have HTN, dementia, recurrent C. Diff and hypothyroidism. Sustained mechanical fall at home. No head trauma or loss of consciousness. Admitted to the medical unit, placed on analgesic therapy and consulted orthopedic service. Surgical procedure 05/25, cephalomedullary fixation, Rightfemur. CXR minimal basilar atelectasis. No acute infiltrate or edema Type of Study: Bedside Swallow Evaluation Previous Swallow Assessment: none in chart Diet Prior to this Study: Regular;Thin liquids Temperature Spikes Noted: No Respiratory Status: Room air History of Recent Intubation:  No Behavior/Cognition: Alert;Cooperative;Pleasant mood Oral Cavity Assessment: Within Functional Limits Oral Care Completed by SLP: No Oral Cavity - Dentition: Adequate  natural dentition Vision: Functional for self-feeding Self-Feeding Abilities: Able to feed self Patient Positioning: Upright in chair Baseline Vocal Quality: Normal Volitional Cough: Strong Volitional Swallow: Able to elicit    Oral/Motor/Sensory Function Overall Oral Motor/Sensory Function: Within functional limits   Ice Chips Ice chips: Within functional limits Presentation: Spoon   Thin Liquid Thin Liquid: Within functional limits Presentation: Self Fed;Cup;Straw    Nectar Thick Nectar Thick Liquid: Not tested   Honey Thick Honey Thick Liquid: Not tested   Puree Puree: Within functional limits Presentation: Self Fed;Spoon   Solid   GO   Rondel Baton, [place], CCC-SLP Speech-Language Pathologist [phone] Solid: Within functional limits Presentation: Self Fed        Brianna Reynolds 12/25/2016,4:41 PM

## 2016-12-26 DIAGNOSIS — S72001S Fracture of unspecified part of neck of right femur, sequela: Secondary | ICD-10-CM

## 2016-12-26 LAB — TYPE AND SCREEN
ABO/RH(D): AB POS
ANTIBODY SCREEN: NEGATIVE
UNIT DIVISION: 0
UNIT DIVISION: 0

## 2016-12-26 LAB — CBC
HCT: 30.2 % — ABNORMAL LOW (ref 36.0–46.0)
Hemoglobin: 10 g/dL — ABNORMAL LOW (ref 12.0–15.0)
MCH: 28.7 pg (ref 26.0–34.0)
MCHC: 33.1 g/dL (ref 30.0–36.0)
MCV: 86.8 fL (ref 78.0–100.0)
PLATELETS: 203 10*3/uL (ref 150–400)
RBC: 3.48 MIL/uL — AB (ref 3.87–5.11)
RDW: 15.8 % — AB (ref 11.5–15.5)
WBC: 11.1 10*3/uL — ABNORMAL HIGH (ref 4.0–10.5)

## 2016-12-26 LAB — BPAM RBC
BLOOD PRODUCT EXPIRATION DATE: 201806042359
Blood Product Expiration Date: 201806042359
ISSUE DATE / TIME: 201805271826
ISSUE DATE / TIME: 201805271212
UNIT TYPE AND RH: 6200
UNIT TYPE AND RH: 6200

## 2016-12-26 NOTE — NC FL2 (Signed)
[place] MEDICAID FL2 LEVEL OF CARE SCREENING TOOL     IDENTIFICATION  Patient Name: Brianna Reynolds Birthdate: October 17, 1923 Sex: female Admission Date (Current Location): 12/21/2016  [facility] and [place] Number:  Producer, television/film/video and Address:  [facility] [place], [address] [place], [place] [address]      Provider Number: [id number]  Attending Physician Name and Address:  Penny Pia, MD  Relative Name and Phone Number:       Current Level of Care: Hospital Recommended Level of Care: Skilled Nursing Facility Prior Approval Number:    Date Approved/Denied: 12/23/16 PASRR Number: [id number]  Discharge Plan: SNF    Current Diagnoses: Patient Active Problem List   Diagnosis Date Noted  . Closed comminuted intertrochanteric fracture of proximal end of right femur (HCC) 12/23/2016  . Closed right hip fracture (HCC) 12/22/2016  . Fall at home, initial encounter 12/22/2016  . Dementia 12/22/2016  . Amnesia 01/08/2015  . Cerebrovascular small vessel disease 01/08/2015  . Idiopathic peripheral neuropathy 01/08/2015  . Atherosclerotic cerebrovascular disease 01/08/2015  . Clostridium difficile diarrhea   . Cough   . Recurrent Clostridium difficile diarrhea 06/13/2014  . Leukocytosis 06/13/2014  . Dehydration 06/13/2014  . Blood poisoning   . Hypothyroid   . Insomnia 02/28/2013  . Abnormal serum protein electrophoresis 02/11/2013  . Abnormal EKG   . HTN (hypertension)   . Fatigue     Orientation RESPIRATION BLADDER Height & Weight     Self, Place  Normal Incontinent Weight:   Height:     BEHAVIORAL SYMPTOMS/MOOD NEUROLOGICAL BOWEL NUTRITION STATUS      Continent Diet (See DC Summary)  AMBULATORY STATUS COMMUNICATION OF NEEDS Skin   Extensive Assist Verbally Surgical wounds (Right HIp Closed Incision with dressing mepilex)                       Personal Care Assistance Level of Assistance  Bathing, Feeding, Dressing Bathing  Assistance: Limited assistance Feeding assistance: Limited assistance Dressing Assistance: Limited assistance     Functional Limitations Info  Sight, Hearing, Speech Sight Info: Adequate Hearing Info: Adequate Speech Info: Adequate    SPECIAL CARE FACTORS FREQUENCY  PT (By licensed PT), OT (By licensed OT)     PT Frequency: 5xweek OT Frequency: 5xweek            Contractures Contractures Info: Present    Additional Factors Info  Code Status, Allergies, Psychotropic Code Status Info: FULL  Allergies Info: PENICILLINS  Psychotropic Info: Zoloft         Current Medications (12/26/2016):  This is the current hospital active medication list Current Facility-Administered Medications  Medication Dose Route Frequency Provider Last Rate Last Dose  . acetaminophen (TYLENOL) tablet 650 mg  650 mg Oral Q6H PRN Samson Frederic, MD   650 mg at 12/26/16 0234   Or  . acetaminophen (TYLENOL) suppository 650 mg  650 mg Rectal Q6H PRN Swinteck, Arlys John, MD      . acidophilus (RISAQUAD) capsule 1 capsule  1 capsule Oral Daily Katrinka Blazing, Rondell A, MD   1 capsule at 12/26/16 1108  . atorvastatin (LIPITOR) tablet 10 mg  10 mg Oral q1800 Madelyn Flavors A, MD   10 mg at 12/25/16 1635  . clonazePAM (KLONOPIN) tablet 0.5 mg  0.5 mg Oral TID PRN Madelyn Flavors A, MD   0.5 mg at 12/26/16 0234  . donepezil (ARICEPT) tablet 10 mg  10 mg Oral QHS Clydie Braun, MD  10 mg at 12/25/16 2039  . enoxaparin (LOVENOX) injection 40 mg  40 mg Subcutaneous Q24H Samson FredericSwinteck, Brian, MD   40 mg at 12/26/16 1109  . fesoterodine (TOVIAZ) tablet 8 mg  8 mg Oral Daily Samson FredericSwinteck, Brian, MD   8 mg at 12/26/16 1108  . HYDROcodone-acetaminophen (NORCO/VICODIN) 5-325 MG per tablet 1-2 tablet  1-2 tablet Oral Q6H PRN Clydie BraunSmith, Rondell A, MD   2 tablet at 12/24/16 1035  . levothyroxine (SYNTHROID, LEVOTHROID) tablet 75 mcg  75 mcg Oral QAC breakfast Madelyn FlavorsSmith, Rondell A, MD   75 mcg at 12/26/16 1108  . Melatonin TABS 4.5 mg  4.5 mg Oral  QHS Smith, Rondell A, MD   4.5 mg at 12/25/16 2100  . menthol-cetylpyridinium (CEPACOL) lozenge 3 mg  1 lozenge Oral PRN Swinteck, Arlys JohnBrian, MD       Or  . phenol (CHLORASEPTIC) mouth spray 1 spray  1 spray Mouth/Throat PRN Swinteck, Arlys JohnBrian, MD      . methocarbamol (ROBAXIN) tablet 500 mg  500 mg Oral Q6H PRN Madelyn FlavorsSmith, Rondell A, MD   500 mg at 12/25/16 1635   Or  . methocarbamol (ROBAXIN) 500 mg in dextrose 5 % 50 mL IVPB  500 mg Intravenous Q6H PRN Smith, Rondell A, MD      . morphine 4 MG/ML injection 0.52 mg  0.52 mg Intravenous Q2H PRN Madelyn FlavorsSmith, Rondell A, MD   0.52 mg at 12/22/16 0445  . ondansetron (ZOFRAN) injection 4 mg  4 mg Intravenous Q6H PRN Azalia Bilisampos, Kevin, MD   4 mg at 12/22/16 0017  . polyethylene glycol (MIRALAX / GLYCOLAX) packet 17 g  17 g Oral BID Coralie KeensArrien, Mauricio Daniel, MD   17 g at 12/26/16 1108  . QUEtiapine (SEROQUEL) tablet 25 mg  25 mg Oral QHS PRN Madelyn FlavorsSmith, Rondell A, MD      . sertraline (ZOLOFT) tablet 75 mg  75 mg Oral Daily Madelyn FlavorsSmith, Rondell A, MD   75 mg at 12/26/16 1105     Discharge Medications: Please see discharge summary for a list of discharge medications.  Relevant Imaging Results:  Relevant Lab Results:   Additional Information [id number]  Tresa MoorePatricia V Chiann Goffredo, LCSW

## 2016-12-26 NOTE — Progress Notes (Signed)
Subjective: 3 Days Post-Op Procedure(s) (LRB): RIGHT INTRAMEDULLARY (IM) NAIL INTERTROCHANTRIC (Right) Patient reports pain as mild.    Objective: Vital signs in last 24 hours: Temp:  [98.2 F (36.8 C)-99 F (37.2 C)] 98.6 F (37 C) (05/27 2100) Pulse Rate:  [85-92] 91 (05/27 2100) Resp:  [20-33] 20 (05/28 0500) BP: (103-150)/(41-61) 150/61 (05/27 2100) SpO2:  [94 %-100 %] 95 % (05/27 2100)  Intake/Output from previous day: 05/27 0701 - 05/28 0700 In: 766 [P.O.:200; Blood:566] Out: 200 [Urine:200] Intake/Output this shift: Total I/O In: 483 [P.O.:200; Blood:283] Out: 200 [Urine:200]   Recent Labs  12/24/16 0336 12/25/16 0315  HGB 8.1* 7.1*    Recent Labs  12/24/16 0336 12/25/16 0315  WBC 12.1* 12.2*  RBC 2.77* 2.44*  HCT 25.5* 22.4*  PLT 157 177    Recent Labs  12/24/16 0336 12/25/16 0315  NA 135 135  K 3.9 3.5  CL 107 106  CO2 24 23  BUN 11 12  CREATININE 0.72 0.69  GLUCOSE 122* 122*  CALCIUM 7.9* 8.1*   No results for input(s): LABPT, INR in the last 72 hours.  Sensation intact distally Intact pulses distally Dorsiflexion/Plantar flexion intact No cellulitis present Compartment soft  Assessment/Plan: 3 Days Post-Op Procedure(s) (LRB): RIGHT INTRAMEDULLARY (IM) NAIL INTERTROCHANTRIC (Right) Up with therapy  Await hemoglobin this AM post-transfusion yesterday  Brianna Reynolds V 12/26/2016, 6:51 AM

## 2016-12-26 NOTE — [place] FL2 (Signed)
[place] MEDICAID FL2 LEVEL OF CARE SCREENING TOOL     IDENTIFICATION  Patient Name: Brianna Reynolds Birthdate: 02/22/1924 Sex: female Admission Date (Current Location): 12/21/2016  [facility] and [place] Number:  Producer, television/film/video and Address:  [facility], [address], [place], [place] [address]      Provider Number: [id number]  Attending Physician Name and Address:  Penny Pia, MD  Relative Name and Phone Number:       Current Level of Care: Hospital Recommended Level of Care: Skilled Nursing Facility Prior Approval Number:    Date Approved/Denied: 12/23/16 PASRR Number: [id number]  Discharge Plan: SNF    Current Diagnoses: Patient Active Problem List   Diagnosis Date Noted  . Closed comminuted intertrochanteric fracture of proximal end of right femur (HCC) 12/23/2016  . Closed right hip fracture (HCC) 12/22/2016  . Fall at home, initial encounter 12/22/2016  . Dementia 12/22/2016  . Amnesia 01/08/2015  . Cerebrovascular small vessel disease 01/08/2015  . Idiopathic peripheral neuropathy 01/08/2015  . Atherosclerotic cerebrovascular disease 01/08/2015  . Clostridium difficile diarrhea   . Cough   . Recurrent Clostridium difficile diarrhea 06/13/2014  . Leukocytosis 06/13/2014  . Dehydration 06/13/2014  . Blood poisoning   . Hypothyroid   . Insomnia 02/28/2013  . Abnormal serum protein electrophoresis 02/11/2013  . Abnormal EKG   . HTN (hypertension)   . Fatigue     Orientation RESPIRATION BLADDER Height & Weight     Self, Place  Normal Incontinent Weight:   Height:     BEHAVIORAL SYMPTOMS/MOOD NEUROLOGICAL BOWEL NUTRITION STATUS      Continent Diet (See DC Summary)  AMBULATORY STATUS COMMUNICATION OF NEEDS Skin   Extensive Assist Verbally Surgical wounds (Right HIp Closed Incision with dressing mepilex)                       Personal Care Assistance Level of Assistance  Bathing, Feeding, Dressing Bathing  Assistance: Limited assistance Feeding assistance: Limited assistance Dressing Assistance: Limited assistance     Functional Limitations Info  Sight, Hearing, Speech Sight Info: Adequate Hearing Info: Adequate Speech Info: Adequate    SPECIAL CARE FACTORS FREQUENCY  PT (By licensed PT), OT (By licensed OT)     PT Frequency: 5xweek OT Frequency: 5xweek            Contractures Contractures Info: Present    Additional Factors Info  Code Status, Allergies, Psychotropic Code Status Info: FULL  Allergies Info: PENICILLINS  Psychotropic Info: Zoloft         Current Medications (12/26/2016):  This is the current hospital active medication list Current Facility-Administered Medications  Medication Dose Route Frequency Provider Last Rate Last Dose  . acetaminophen (TYLENOL) tablet 650 mg  650 mg Oral Q6H PRN Samson Frederic, MD   650 mg at 12/26/16 0234   Or  . acetaminophen (TYLENOL) suppository 650 mg  650 mg Rectal Q6H PRN Swinteck, Arlys John, MD      . acidophilus (RISAQUAD) capsule 1 capsule  1 capsule Oral Daily Katrinka Blazing, Rondell A, MD   1 capsule at 12/25/16 1259  . atorvastatin (LIPITOR) tablet 10 mg  10 mg Oral q1800 Madelyn Flavors A, MD   10 mg at 12/25/16 1635  . clonazePAM (KLONOPIN) tablet 0.5 mg  0.5 mg Oral TID PRN Madelyn Flavors A, MD   0.5 mg at 12/26/16 0234  . donepezil (ARICEPT) tablet 10 mg  10 mg Oral QHS Clydie Braun, MD  10 mg at 12/25/16 2039  . enoxaparin (LOVENOX) injection 40 mg  40 mg Subcutaneous Q24H Samson FredericSwinteck, Brian, MD   40 mg at 12/25/16 1300  . fesoterodine (TOVIAZ) tablet 8 mg  8 mg Oral Daily Samson FredericSwinteck, Brian, MD   8 mg at 12/25/16 1259  . HYDROcodone-acetaminophen (NORCO/VICODIN) 5-325 MG per tablet 1-2 tablet  1-2 tablet Oral Q6H PRN Clydie BraunSmith, Rondell A, MD   2 tablet at 12/24/16 1035  . levothyroxine (SYNTHROID, LEVOTHROID) tablet 75 mcg  75 mcg Oral QAC breakfast Madelyn FlavorsSmith, Rondell A, MD   75 mcg at 12/25/16 1300  . Melatonin TABS 4.5 mg  4.5 mg Oral  QHS Smith, Rondell A, MD   4.5 mg at 12/25/16 2100  . menthol-cetylpyridinium (CEPACOL) lozenge 3 mg  1 lozenge Oral PRN Swinteck, Arlys JohnBrian, MD       Or  . phenol (CHLORASEPTIC) mouth spray 1 spray  1 spray Mouth/Throat PRN Swinteck, Arlys JohnBrian, MD      . methocarbamol (ROBAXIN) tablet 500 mg  500 mg Oral Q6H PRN Madelyn FlavorsSmith, Rondell A, MD   500 mg at 12/25/16 1635   Or  . methocarbamol (ROBAXIN) 500 mg in dextrose 5 % 50 mL IVPB  500 mg Intravenous Q6H PRN Smith, Rondell A, MD      . morphine 4 MG/ML injection 0.52 mg  0.52 mg Intravenous Q2H PRN Madelyn FlavorsSmith, Rondell A, MD   0.52 mg at 12/22/16 0445  . ondansetron (ZOFRAN) injection 4 mg  4 mg Intravenous Q6H PRN Azalia Bilisampos, Kevin, MD   4 mg at 12/22/16 0017  . polyethylene glycol (MIRALAX / GLYCOLAX) packet 17 g  17 g Oral BID Coralie KeensArrien, Mauricio Daniel, MD   17 g at 12/25/16 2039  . QUEtiapine (SEROQUEL) tablet 25 mg  25 mg Oral QHS PRN Madelyn FlavorsSmith, Rondell A, MD      . sertraline (ZOLOFT) tablet 75 mg  75 mg Oral Daily Madelyn FlavorsSmith, Rondell A, MD   75 mg at 12/25/16 1259     Discharge Medications: Please see discharge summary for a list of discharge medications.  Relevant Imaging Results:  Relevant Lab Results:   Additional Information [id number]  Tresa MoorePatricia V Mercy Malena, LCSW

## 2016-12-26 NOTE — Social Work (Signed)
CSW called [facility] to see if they have a bed to offer. CSW left Messg for Peggy in admissions. CSW will f/u.

## 2016-12-26 NOTE — Progress Notes (Signed)
Physical Therapy Treatment Patient Details Name: Brianna Reynolds MRN: [id number] DOB: 30-Jul-1924 Today's Date: 12/26/2016    History of Present Illness Patient is a 83 year old female S/P IM nail of the right hip. Per patient baseline mobility was good. PMH: fatigue, memory loss, c-diff, HTN, cataract,      PT Comments    Pt admitted with above diagnosis. Pt currently with functional limitations due to the deficits listed below (see PT Problem List). Pt was able to perform exercises with min to mod assist.  Assisted back to bed needing +2 max assist as 30% weight bearing difficult for pt.  Pt going to SNF today most likely to continue therapy.  Pt will benefit from skilled PT to increase their independence and safety with mobility to allow discharge to the venue listed below.     Follow Up Recommendations  SNF     Equipment Recommendations   (Patient likely going to SNF)    Recommendations for Other Services Rehab consult     Precautions / Restrictions Precautions Precautions: Fall Restrictions Weight Bearing Restrictions: Yes RLE Weight Bearing: Partial weight bearing RLE Partial Weight Bearing Percentage or Pounds: 30    Mobility  Bed Mobility Overal bed mobility: Needs Assistance Bed Mobility: Sit to Supine       Sit to supine: Total assist;+2 for physical assistance   General bed mobility comments: Max directional cues.  (A) to lift bil LEs into bed with use of pad to pivot hips.    Transfers Overall transfer level: Needs assistance Equipment used: Rolling walker (2 wheeled) Transfers: Stand Pivot Transfers;Sit to/from Stand Sit to Stand: Max assist;+2 physical assistance Stand pivot transfers: Max assist;Total assist;+2 physical assistance       General transfer comment: Cues for sequencing and technique.  Pt able to sit to stand from recliner with assist to ensure that pt put less than 30% weight on her right LE.  Pivot with RW recliner to bed. Max assist  for all components of transfer as pt needed assist to pivot, to weight shift and cues to stand upright.  .    Ambulation/Gait                 Stairs            Wheelchair Mobility    Modified Rankin (Stroke Patients Only)       Balance Overall balance assessment: Needs assistance Sitting-balance support: Bilateral upper extremity supported Sitting balance-Leahy Scale: Poor Sitting balance - Comments: required assist to remain balanced.    Standing balance support: Bilateral upper extremity supported;During functional activity Standing balance-Leahy Scale: Zero Standing balance comment: Needs Max assit to stand with RW with 2 persons.                             Cognition Arousal/Alertness: Awake/alert Behavior During Therapy: WFL for tasks assessed/performed;Anxious Overall Cognitive Status: History of cognitive impairments - at baseline                                 General Comments: Per family patient had memory deficits at baseline       Exercises General Exercises - Lower Extremity Ankle Circles/Pumps: AROM;Both;10 reps Quad Sets: AROM;Both;10 reps;Supine Heel Slides: AAROM;Strengthening;Right;10 reps Hip ABduction/ADduction: AAROM;Strengthening;Right;10 reps Straight Leg Raises: AAROM;Strengthening;Right;5 reps    General Comments General comments (skin integrity, edema, etc.): Patients husband and  daughter present      Pertinent Vitals/Pain Pain Assessment: Faces Faces Pain Scale: Hurts little more Pain Location: R leg Pain Descriptors / Indicators: Discomfort;Grimacing;Guarding;Constant Pain Intervention(s): Limited activity within patient's tolerance;Monitored during session;Premedicated before session;Repositioned    Home Living                      Prior Function            PT Goals (current goals can now be found in the care plan section) Progress towards PT goals: Progressing toward goals     Frequency    Min 5X/week      PT Plan Current plan remains appropriate    Co-evaluation              AM-PAC PT "6 Clicks" Daily Activity  Outcome Measure  Difficulty turning over in bed (including adjusting bedclothes, sheets and blankets)?: A Lot Difficulty moving from lying on back to sitting on the side of the bed? : A Lot Difficulty sitting down on and standing up from a chair with arms (e.g., wheelchair, bedside commode, etc,.)?: A Lot Help needed moving to and from a bed to chair (including a wheelchair)?: Total Help needed walking in hospital room?: Total Help needed climbing 3-5 steps with a railing? : Total 6 Click Score: 9    End of Session Equipment Utilized During Treatment: Gait belt Activity Tolerance: Patient limited by fatigue;Patient limited by pain Patient left: with family/visitor present;in bed;with bed alarm set;with call bell/phone within reach;with SCD's reapplied Nurse Communication: Mobility status;Weight bearing status PT Visit Diagnosis: Unsteadiness on feet (R26.81);Difficulty in walking, not elsewhere classified (R26.2);Pain Pain - Right/Left: Right Pain - part of body: Hip     Time: [id number] PT Time Calculation (min) (ACUTE ONLY): 16 min  Charges:  $Therapeutic Exercise: 8-22 mins                    G Codes:       Brennin Durfee,PT Acute Rehabilitation [phone] (pager)    Berline Lopesawn F Javion Holmer 12/26/2016, 4:17 PM

## 2016-12-26 NOTE — Clinical Social Work Note (Signed)
Clinical Social Work Assessment  Patient Details  Name: DAJE STARK MRN: [id number] Date of Birth: 29-Nov-1923  Date of referral:  12/26/16               Reason for consult:  Facility Placement                Permission sought to share information with:  Facility Art therapist granted to share information::  Yes, Verbal Permission Granted  Name::        Agency::  SNF  Relationship::  daughter  Contact Information:     Housing/Transportation Living arrangements for the past 2 months:  Single Family Home Source of Information:  Spouse, Adult Children Patient Interpreter Needed:  None Criminal Activity/Legal Involvement Pertinent to Current Situation/Hospitalization:  No - Comment as needed Significant Relationships:  Adult Children, Spouse Lives with:  Spouse Do you feel safe going back to the place where you live?  No Need for family participation in patient care:  Yes (Comment)  Care giving concerns:  Patient resided with spouse prior to hospitalization and was independent. Spouse is same age as patient and unable to care for her. Patient not safe to return home at this time.  Social Worker assessment / plan:  CSW met with patient, spouse and daughter at bedside. CSW introduced herself and explained her role. CSW discussed the recommendations from clinical team to SNF once medically stable. Patient and family are in agreement. CSW obtained verbal permission to send out offers. Family indicated that their first SNF option is [facility].   FL2 and Passr complete. Offers sent today.  Employment status:  Retired Forensic scientist:  Medicare PT Recommendations:  [place] / Referral to community resources:  [place]  Patient/Family's Response to care:  Patient and family appreciative of CSW assistance with DC to SNF. No issues or concerns identified at this time.  Patient/Family's Understanding of and Emotional  Response to Diagnosis, Current Treatment, and Prognosis:  CSW has good understanding of diagnosis, current treatment and prognosis. Family is hopeful that rehabilitation will address physical impairment and support with patient returning to baseline prior to surgery. No issues or concerns identified at this time.  Emotional Assessment Appearance:  Appears stated age Attitude/Demeanor/Rapport:   (Cooperative) Affect (typically observed):  Accepting, Appropriate Orientation:  Oriented to Self, Oriented to Place Alcohol / Substance use:  Not Applicable Psych involvement (Current and /or in the community):  No (Comment)  Discharge Needs  Concerns to be addressed:  Care Coordination Readmission within the last 30 days:  No Current discharge risk:  Physical Impairment, Dependent with Mobility Barriers to Discharge:  No Barriers Identified   Normajean Baxter, LCSW 12/26/2016, 10:47 AM

## 2016-12-26 NOTE — Progress Notes (Signed)
PROGRESS NOTE    Brianna Reynolds  HQI:[id number] DOB: 1924-01-19 DOA: 12/21/2016 PCP: Sherren MochaShaw, Eva N, MD    Brief Narrative:  83 yo female presented with the chief complain of a fall. Patient known to have HTN, dementia, recurrent C. Diff and hypothyroidism. Sustained mechanical fall at home. Post trauma complained of sever pain at the right proximal leg, unable to ambulate or bear weight. No head trauma or loss of consciousness. On the initial physical examination, blood pressure 121/72 with heart rate 85 with respiratory rate 18 and oxygen saturation 94%. Mucous membranes were moist, lungs clear to auscultation, rhythmic S1 and S2, abdomen soft, no lower extremity edema. Positive deformity on the proximal thigh with decreases range of motion due to pain. Imaging studies, with right comminuted displaced femoral fracture. Admitted to the medical unit, placed on analgesic therapy and consulted orthopedic service. Surgical procedure 05/25, cephalomedullary fixation, rightfemur. Patient will need SNF at discharge.    Assessment & Plan:   Principal Problem:   Closed right hip fracture [facility](HCC) Active Problems:   Hypothyroid   Leukocytosis   Fall at home, initial encounter   Dementia   Closed comminuted intertrochanteric fracture of proximal end of right femur (HCC)   1. Right comminuted displaced femur fracture.  - continue supportive therapy - there was concern about some alteration in mentation with pain medication regimen. Will monitor d/c next am if delirium resolved.  2. Leukocytosis. Wbc stable at 11, no indication for antibiotic therapy.   3. Anxiety. On clonazepam, melatonin, quetiapine and sertraline, per her home regimen. Had difficulty swallowing, will follow on speech evaluation. Aspiration precautions.   4. Hypothyroidism. Onlevothyroxine, clinically euthyroid.   5. Dementia. On donepezil. No agitation.   6. Dislipidemia. On atorvastatin.   7. Acute post-operative  anemia. Hb reached a critical level of 7.1, blood transfusion ordered, 2 units, no signs of bleeding, likley multifactorial.  - hgb 10.0 on last check.  DVT prophylaxis:enoxaparin  Code Status:Full  Family Communication:Patient's family updated. Disposition Plan:d/c next am with resolution of delirium   Consultants:  Orthopedics  Procedures: 05/25, cephalomedullary fixation, Rightfemur.     Antimicrobials   Subjective: Patient was confused above baseline this am.  Objective: Vitals:   12/25/16 1855 12/25/16 2100 12/26/16 0500 12/26/16 0600  BP: (!) 104/43 (!) 150/61  (!) 118/53  Pulse: 86 91  93  Resp: (!) 22 20 20 18   Temp: 99 F (37.2 C) 98.6 F (37 C)  98.7 F (37.1 C)  TempSrc: Axillary Oral  Oral  SpO2: 96% 95%  97%    Intake/Output Summary (Last 24 hours) at 12/26/16 1555 Last data filed at 12/25/16 2300  Gross per 24 hour  Intake              766 ml  Output              200 ml  Net              566 ml   There were no vitals filed for this visit.  Examination:  General exam: pt in nad, alert and awake E ENT: mild pallor, no icterus. Oral mucosa moist.  Respiratory system: No wheezing, rales or rhonchi. Respiratory effort normal. Cardiovascular system: S1 & S2 heard, RRR.  Gastrointestinal system: Abdomen is nondistended, soft and nontender. No organomegaly or masses felt. Normal bowel sounds heard. Central nervous system: Alert and oriented. No focal neurological deficits. Extremities: warm and dry Skin: No rashes, lesions or ulcers on limited  exam.     Data Reviewed: I have personally reviewed following labs and imaging studies  CBC:  Recent Labs Lab 12/21/16 2348 12/23/16 0554 12/24/16 0336 12/25/16 0315 12/26/16 0520  WBC 15.0* 14.3* 12.1* 12.2* 11.1*  NEUTROABS  --  11.1* 8.3*  --   --   HGB 12.8 10.7* 8.1* 7.1* 10.0*  HCT 39.9 34.4* 25.5* 22.4* 30.2*  MCV 92.1 93.2 92.1 91.8 86.8  PLT 244 192 157 177 203   Basic  Metabolic Panel:  Recent Labs Lab 12/21/16 2348 12/23/16 0554 12/24/16 0336 12/25/16 0315  NA 138 138 135 135  K 3.8 3.7 3.9 3.5  CL 107 107 107 106  CO2 23 25 24 23   GLUCOSE 126* 119* 122* 122*  BUN 16 8 11 12   CREATININE 0.87 0.62 0.72 0.69  CALCIUM 8.7* 8.5* 7.9* 8.1*   GFR: Estimated Creatinine Clearance: 35.3 mL/min (by C-G formula based on SCr of 0.69 mg/dL). Liver Function Tests:  Recent Labs Lab 12/21/16 2348  AST 16  ALT 9*  ALKPHOS 67  BILITOT 0.6  PROT 6.1*  ALBUMIN 3.4*   No results for input(s): LIPASE, AMYLASE in the last 168 hours. No results for input(s): AMMONIA in the last 168 hours. Coagulation Profile:  Recent Labs Lab 12/21/16 2348  INR 0.91   Cardiac Enzymes: No results for input(s): CKTOTAL, CKMB, CKMBINDEX, TROPONINI in the last 168 hours. BNP (last 3 results) No results for input(s): PROBNP in the last 8760 hours. HbA1C: No results for input(s): HGBA1C in the last 72 hours. CBG: No results for input(s): GLUCAP in the last 168 hours. Lipid Profile: No results for input(s): CHOL, HDL, LDLCALC, TRIG, CHOLHDL, LDLDIRECT in the last 72 hours. Thyroid Function Tests: No results for input(s): TSH, T4TOTAL, FREET4, T3FREE, THYROIDAB in the last 72 hours. Anemia Panel: No results for input(s): VITAMINB12, FOLATE, FERRITIN, TIBC, IRON, RETICCTPCT in the last 72 hours. Sepsis Labs: No results for input(s): PROCALCITON, LATICACIDVEN in the last 168 hours.  Recent Results (from the past 240 hour(s))  Surgical pcr screen     Status: None   Collection Time: 12/22/16  4:36 AM  Result Value Ref Range Status   MRSA, PCR NEGATIVE NEGATIVE Final   Staphylococcus aureus NEGATIVE NEGATIVE Final    Comment:        The Xpert SA Assay (FDA approved for NASAL specimens in patients over 76 years of age), is one component of a comprehensive surveillance program.  Test performance has been validated by [facility] for patients greater than or equal  to 1 year old. It is not intended to diagnose infection nor to guide or monitor treatment.          Radiology Studies: No results found.      Scheduled Meds: . acidophilus  1 capsule Oral Daily  . atorvastatin  10 mg Oral q1800  . donepezil  10 mg Oral QHS  . enoxaparin (LOVENOX) injection  40 mg Subcutaneous Q24H  . fesoterodine  8 mg Oral Daily  . levothyroxine  75 mcg Oral QAC breakfast  . Melatonin  4.5 mg Oral QHS  . polyethylene glycol  17 g Oral BID  . sertraline  75 mg Oral Daily   Continuous Infusions: . methocarbamol (ROBAXIN)  IV       LOS: 4 days    Penny Pia, MD [facility] Pager [phone]  If 7PM-7AM, please contact night-coverage [email or web address] Password [facility] 12/26/2016, 3:55 PM

## 2016-12-27 DIAGNOSIS — S72001S Fracture of unspecified part of neck of right femur, sequela: Secondary | ICD-10-CM | POA: Diagnosis not present

## 2016-12-27 DIAGNOSIS — D649 Anemia, unspecified: Secondary | ICD-10-CM | POA: Diagnosis not present

## 2016-12-27 DIAGNOSIS — G4700 Insomnia, unspecified: Secondary | ICD-10-CM | POA: Diagnosis not present

## 2016-12-27 DIAGNOSIS — L309 Dermatitis, unspecified: Secondary | ICD-10-CM | POA: Diagnosis not present

## 2016-12-27 DIAGNOSIS — F329 Major depressive disorder, single episode, unspecified: Secondary | ICD-10-CM | POA: Diagnosis not present

## 2016-12-27 DIAGNOSIS — D509 Iron deficiency anemia, unspecified: Secondary | ICD-10-CM | POA: Diagnosis not present

## 2016-12-27 DIAGNOSIS — F419 Anxiety disorder, unspecified: Secondary | ICD-10-CM | POA: Diagnosis not present

## 2016-12-27 DIAGNOSIS — F0391 Unspecified dementia with behavioral disturbance: Secondary | ICD-10-CM | POA: Diagnosis not present

## 2016-12-27 DIAGNOSIS — S72101D Unspecified trochanteric fracture of right femur, subsequent encounter for closed fracture with routine healing: Secondary | ICD-10-CM | POA: Diagnosis not present

## 2016-12-27 DIAGNOSIS — Z9181 History of falling: Secondary | ICD-10-CM | POA: Diagnosis not present

## 2016-12-27 DIAGNOSIS — G609 Hereditary and idiopathic neuropathy, unspecified: Secondary | ICD-10-CM | POA: Diagnosis not present

## 2016-12-27 DIAGNOSIS — F0390 Unspecified dementia without behavioral disturbance: Secondary | ICD-10-CM | POA: Diagnosis not present

## 2016-12-27 DIAGNOSIS — I10 Essential (primary) hypertension: Secondary | ICD-10-CM | POA: Diagnosis not present

## 2016-12-27 DIAGNOSIS — E039 Hypothyroidism, unspecified: Secondary | ICD-10-CM | POA: Diagnosis not present

## 2016-12-27 DIAGNOSIS — E785 Hyperlipidemia, unspecified: Secondary | ICD-10-CM | POA: Diagnosis not present

## 2016-12-27 DIAGNOSIS — R4182 Altered mental status, unspecified: Secondary | ICD-10-CM | POA: Diagnosis not present

## 2016-12-27 DIAGNOSIS — G8911 Acute pain due to trauma: Secondary | ICD-10-CM | POA: Diagnosis not present

## 2016-12-27 DIAGNOSIS — S72141D Displaced intertrochanteric fracture of right femur, subsequent encounter for closed fracture with routine healing: Secondary | ICD-10-CM | POA: Diagnosis not present

## 2016-12-27 MED ORDER — ENOXAPARIN SODIUM 40 MG/0.4ML ~~LOC~~ SOLN: 40.0000 mg | SUBCUTANEOUS | 0 refills | Status: DC

## 2016-12-27 MED ORDER — CLONAZEPAM 0.5 MG PO TABS: 0.5000 mg | ORAL_TABLET | Freq: Three times a day (TID) | ORAL | 0 refills | Status: DC | PRN

## 2016-12-27 MED ORDER — HYDROCODONE-ACETAMINOPHEN 5-325 MG PO TABS: 1.0000 | ORAL_TABLET | Freq: Four times a day (QID) | ORAL | 0 refills | Status: DC | PRN

## 2016-12-27 NOTE — Discharge Summary (Signed)
Physician Discharge Summary  Charleen Kirksileen D Nole NWG:[id number] DOB: 1923/12/20 DOA: 12/21/2016  PCP: Sherren MochaShaw, Eva N, MD  Admit date: 12/21/2016 Discharge date: 12/27/2016  Time spent: > 35 minutes  Recommendations for Outpatient Follow-up:  1. Monitor WBC levels 2. Ensure f/u with orhopeadic surgeon   Discharge Diagnoses:  Principal Problem:   Closed right hip fracture (HCC) Active Problems:   Hypothyroid   Leukocytosis   Fall at home, initial encounter   Dementia   Closed comminuted intertrochanteric fracture of proximal end of right femur [facility](HCC)   Discharge Condition: stable  Diet recommendation: Heart healthy  There were no vitals filed for this visit.  History of present illness:  83 y.o. female with medical history significant of HTN, HLD, recurrent C. difficile, dementia, and hypothyroidism; who presents with having having a fall at home while preparing for bed. History is obtained from the patient and from review of report. She reports significant pain out of the right proximal leg with inability to ambulate or bear weight on the affected leg.  Hospital Course:  1. Right comminuted displaced femur fracture.  - Ortho managed while patient was in house.   TDWB RLE with walker DVT ppx: lovenox x30 days, SCDs, TEDs PO pain control PT/OT  2. Leukocytosis. Wbc stable at 11, no indication for antibiotic therapy.   3. Anxiety. On clonazepam, melatonin, quetiapine and sertraline, per her home regimen. Had difficulty swallowing, will follow on speech evaluation. Aspiration precautions.   4. Hypothyroidism. Onlevothyroxine, clinically euthyroid.   5. Dementia. On donepezil. No agitation.   6. Dislipidemia. On atorvastatin.   7. Acute post-operative anemia. Hb reached a critical level of 7.1, blood transfusion ordered, 2 units, no signs of bleeding, likley multifactorial.  - hgb 10.0 on last check.  Procedures:  None  Consultations:  Orthopaedic  surgery  Discharge Exam: Vitals:   12/26/16 1900 12/27/16 0400  BP: (!) 156/47 (!) 145/56  Pulse: 90 73  Resp: 18 16  Temp: 98.3 F (36.8 C) 99.3 F (37.4 C)    General: Pt in nad. Alert and awake Cardiovascular: rrr, no rubs Respiratory: no increased wob, no wheezes  Discharge Instructions   Discharge Instructions    Call MD for:  severe uncontrolled pain    Complete by:  As directed    Call MD for:  temperature >100.4    Complete by:  As directed    Diet - low sodium heart healthy    Complete by:  As directed    Discharge instructions    Complete by:  As directed    Please ensure f/u with orthopeadic surgeon   Increase activity slowly    Complete by:  As directed      Current Discharge Medication List    START taking these medications   Details  enoxaparin (LOVENOX) 40 MG/0.4ML injection Inject 0.4 mLs (40 mg total) into the skin daily. Qty: 30 Syringe, Refills: 0    HYDROcodone-acetaminophen (NORCO/VICODIN) 5-325 MG tablet Take 1-2 tablets by mouth every 6 (six) hours as needed for moderate pain. Qty: 30 tablet, Refills: 0      CONTINUE these medications which have NOT CHANGED   Details  atorvastatin (LIPITOR) 10 MG tablet TAKE 1 TABLET EACH DAY. Qty: 30 tablet, Refills: 5    clonazePAM (KLONOPIN) 0.5 MG tablet TAKE 1 TABLET THREE TIMES DAILY AS NEEDED FOR ANXIETY. Qty: 30 tablet, Refills: 0    donepezil (ARICEPT) 10 MG tablet Take 10 mg by mouth.    levothyroxine (SYNTHROID, LEVOTHROID)  75 MCG tablet Take 1 tablet (75 mcg total) by mouth daily before breakfast. Qty: 90 tablet, Refills: 1    QUEtiapine (SEROQUEL) 25 MG tablet TAKE 1 TABLET AT BEDTIME AS NEEDED FOR INSOMNIA, MAY REPEAT IN 1-2 HOURS IF NEEDED Qty: 60 tablet, Refills: 11   Associated Diagnoses: Insomnia    sertraline (ZOLOFT) 50 MG tablet TAKE 1&1/2 TABLETS ONCE DAILY. Qty: 45 tablet, Refills: 1    tolterodine (DETROL LA) 4 MG 24 hr capsule TAKE (1) CAPSULE DAILY. Qty: 90 capsule,  Refills: 1    acetaminophen (TYLENOL) 325 MG tablet Take 650 mg by mouth every 6 (six) hours as needed for headache (headache).    aspirin EC 81 MG tablet Take 81 mg by mouth daily.    bifidobacterium infantis (ALIGN) capsule Take 1 capsule by mouth daily. Qty: 30 capsule, Refills: 12   Associated Diagnoses: Urine WBC increased    Calcium Carbonate (CALTRATE 600 PO) Take 1 tablet by mouth daily.    Cholecalciferol (VITAMIN D) 2000 UNITS CAPS Take 1 capsule by mouth daily.     Melatonin 5 MG TABS Take 5 mg by mouth at bedtime.     Multiple Vitamins-Minerals (PRESERVISION AREDS) CAPS Take 1 capsule by mouth daily.       Allergies  Allergen Reactions  . Penicillins Rash    Contact information for follow-up providers    Sherren Mocha, MD Follow up.   Specialty:  Family Medicine Contact information: [address] [place] [address] [phone]        Samson Frederic, MD. Schedule an appointment as soon as possible for a visit in 2 week(s).   Specialty:  Orthopedic Surgery Why:  For wound re-check Contact information: [address] [place] [address] [phone]            Contact information for after-discharge care    Destination    [facility] Follow up.   Specialty:  Skilled Nursing Facility Contact information: [address] [place] [address] [phone]                   The results of significant diagnostics from this hospitalization (including imaging, microbiology, ancillary and laboratory) are listed below for reference.    Significant Diagnostic Studies: Dg Chest 1 View  Result Date: 12/22/2016 CLINICAL DATA:  Fall with hip fracture EXAM: CHEST 1 VIEW COMPARISON:  06/12/2014 FINDINGS: Mild cardiomegaly without edema. Aortic atherosclerosis. No pneumothorax. Minimal basilar atelectasis. IMPRESSION: 1. Minimal basilar atelectasis.  No acute infiltrate or edema 2. Cardiomegaly  without overt failure. Electronically Signed   By: Jasmine Pang M.D.   On: 12/22/2016 00:08   Pelvis Portable  Result Date: 12/23/2016 CLINICAL DATA:  Postop EXAM: PORTABLE PELVIS 1-2 VIEWS COMPARISON:  Intraoperative fluoroscopic images dated 12/23/2016 FINDINGS: Status post ORIF of an intertrochanteric right hip fracture. IM rod with dynamic hip screw fixation. Mildly displaced lesser trochanter fragment. Mild degenerative changes of the right hip. Left hip joint space is preserved. Visualized bony pelvis appears intact. Mild degenerative changes of the lower lumbar spine. IMPRESSION: Status post ORIF of the right hip, as above. Electronically Signed   By: Charline Bills M.D.   On: 12/23/2016 16:54   Dg C-arm 1-60 Min  Result Date: 12/23/2016 CLINICAL DATA:  Internal fixation EXAM: RIGHT FEMUR 2 VIEWS; DG C-ARM 61-120 MIN COMPARISON:  12/21/2016 FINDINGS: Internal fixation across the right femoral intertrochanteric fracture. Intramedullary nail in place with dynamic hip screw and single  locking distal screw. Near anatomic alignment. No hardware complicating feature. IMPRESSION: Internal fixation.  No visible complicating feature. Electronically Signed   By: Charlett Nose M.D.   On: 12/23/2016 12:58   Dg Hip Unilat  With Pelvis 2-3 Views Right  Result Date: 12/22/2016 CLINICAL DATA:  Fall while walking to the bathroom EXAM: DG HIP (WITH OR WITHOUT PELVIS) 2-3V RIGHT COMPARISON:  CT 05/18/2014 FINDINGS: Right greater than left SI joint arthritis. The pubic symphysis appears intact. Comminuted intertrochanteric fracture with varus angulation and superior migration of the distal femoral fragment. Close to 1/2 shaft diameter of anterior displacement of distal fracture fragment on the lateral view. Displaced lesser trochanteric fracture fragment. IMPRESSION: Comminuted displaced and slightly angulated right intertrochanteric fracture. Electronically Signed   By: Jasmine Pang M.D.   On: 12/22/2016 00:06    Dg Femur, Min 2 Views Right  Result Date: 12/23/2016 CLINICAL DATA:  Internal fixation EXAM: RIGHT FEMUR 2 VIEWS; DG C-ARM 61-120 MIN COMPARISON:  12/21/2016 FINDINGS: Internal fixation across the right femoral intertrochanteric fracture. Intramedullary nail in place with dynamic hip screw and single locking distal screw. Near anatomic alignment. No hardware complicating feature. IMPRESSION: Internal fixation.  No visible complicating feature. Electronically Signed   By: Charlett Nose M.D.   On: 12/23/2016 12:58   Dg Femur Min 2 Views Right  Result Date: 12/22/2016 CLINICAL DATA:  Fall with pain EXAM: RIGHT FEMUR 2 VIEWS COMPARISON:  None. FINDINGS: Pubic symphysis appears intact. Right femoral head projects in joint. Comminuted and displaced proximal right femur fracture. Distal femur is intact. Degenerative changes at the right knee. Vascular calcifications. IMPRESSION: Comminuted and displaced proximal femoral fracture. Distal femur appears intact. Electronically Signed   By: Jasmine Pang M.D.   On: 12/22/2016 00:07   Dg Femur Port, Min 2 Views Right  Result Date: 12/23/2016 CLINICAL DATA:  Postop reduction of proximal right femur fracture. EXAM: RIGHT FEMUR PORTABLE 2 VIEW COMPARISON:  Radiographs 12/21/2016 and intraoperative radiographs earlier today. FINDINGS: Status post intramedullary nail and dynamic hip screw placement with a distal interlocking screw. The hardware is well positioned. There is near anatomic reduction of the comminuted intratrochanteric and subtrochanteric femur fracture. No complications identified. IMPRESSION: Near anatomic reduction of proximal femur fracture. No demonstrated complication. Electronically Signed   By: Carey Bullocks M.D.   On: 12/23/2016 16:55    Microbiology: Recent Results (from the past 240 hour(s))  Surgical pcr screen     Status: None   Collection Time: 12/22/16  4:36 AM  Result Value Ref Range Status   MRSA, PCR NEGATIVE NEGATIVE Final    Staphylococcus aureus NEGATIVE NEGATIVE Final    Comment:        The Xpert SA Assay (FDA approved for NASAL specimens in patients over 34 years of age), is one component of a comprehensive surveillance program.  Test performance has been validated by [facility] for patients greater than or equal to 43 year old. It is not intended to diagnose infection nor to guide or monitor treatment.      Labs: Basic Metabolic Panel:  Recent Labs Lab 12/21/16 2348 12/23/16 0554 12/24/16 0336 12/25/16 0315  NA 138 138 135 135  K 3.8 3.7 3.9 3.5  CL 107 107 107 106  CO2 23 25 24 23   GLUCOSE 126* 119* 122* 122*  BUN 16 8 11 12   CREATININE 0.87 0.62 0.72 0.69  CALCIUM 8.7* 8.5* 7.9* 8.1*   Liver Function Tests:  Recent Labs Lab 12/21/16 2348  AST  16  ALT 9*  ALKPHOS 67  BILITOT 0.6  PROT 6.1*  ALBUMIN 3.4*   No results for input(s): LIPASE, AMYLASE in the last 168 hours. No results for input(s): AMMONIA in the last 168 hours. CBC:  Recent Labs Lab 12/21/16 2348 12/23/16 0554 12/24/16 0336 12/25/16 0315 12/26/16 0520  WBC 15.0* 14.3* 12.1* 12.2* 11.1*  NEUTROABS  --  11.1* 8.3*  --   --   HGB 12.8 10.7* 8.1* 7.1* 10.0*  HCT 39.9 34.4* 25.5* 22.4* 30.2*  MCV 92.1 93.2 92.1 91.8 86.8  PLT 244 192 157 177 203   Cardiac Enzymes: No results for input(s): CKTOTAL, CKMB, CKMBINDEX, TROPONINI in the last 168 hours. BNP: BNP (last 3 results) No results for input(s): BNP in the last 8760 hours.  ProBNP (last 3 results) No results for input(s): PROBNP in the last 8760 hours.  CBG: No results for input(s): GLUCAP in the last 168 hours.     Signed:  Penny Pia MD.  [facility] 12/27/2016, 11:46 AM

## 2016-12-27 NOTE — Progress Notes (Signed)
   Subjective:  Patient reports pain as mild to moderate.  No c/o.  Objective:   VITALS:   Vitals:   12/26/16 0600 12/26/16 1500 12/26/16 1900 12/27/16 0400  BP: (!) 118/53 (!) 127/43 (!) 156/47 (!) 145/56  Pulse: 93 76 90 73  Resp: 18 18 18 16   Temp: 98.7 F (37.1 C) 98.5 F (36.9 C) 98.3 F (36.8 C) 99.3 F (37.4 C)  TempSrc: Oral Oral Oral Oral  SpO2: 97% 94% 95% 94%    NAD ABD soft Sensation intact distally Intact pulses distally Dorsiflexion/Plantar flexion intact Incision: no drainage Compartment soft   Lab Results  Component Value Date   WBC 11.1 (H) 12/26/2016   HGB 10.0 (L) 12/26/2016   HCT 30.2 (L) 12/26/2016   MCV 86.8 12/26/2016   PLT 203 12/26/2016   BMET    Component Value Date/Time   NA 135 12/25/2016 0315   NA 138 08/04/2016 1518   K 3.5 12/25/2016 0315   CL 106 12/25/2016 0315   CO2 23 12/25/2016 0315   GLUCOSE 122 (H) 12/25/2016 0315   BUN 12 12/25/2016 0315   BUN 12 08/04/2016 1518   CREATININE 0.69 12/25/2016 0315   CREATININE 0.76 01/20/2016 1523   CALCIUM 8.1 (L) 12/25/2016 0315   CALCIUM 10.6 (H) 03/14/2012 1317   GFRNONAA >60 12/25/2016 0315   GFRNONAA 70 01/20/2016 1523   GFRAA >60 12/25/2016 0315   GFRAA 80 01/20/2016 1523     Assessment/Plan: 4 Days Post-Op   Principal Problem:   Closed right hip fracture (HCC) Active Problems:   Hypothyroid   Leukocytosis   Fall at home, initial encounter   Dementia   Closed comminuted intertrochanteric fracture of proximal end of right femur (HCC)   TDWB RLE with walker DVT ppx: lovenox x30 days, SCDs, TEDs PO pain control PT/OT Dispo: D/C to SNF when medically ready   Saga Balthazar, Cloyde ReamsBrian James 12/27/2016, 7:48 AM   Samson FredericBrian Brandan Glauber, MD Cell [phone]

## 2016-12-27 NOTE — Care Management Important Message (Signed)
Important Message  Patient Details  Name: Brianna Reynolds MRN: [id number] Date of Birth: October 26, 1923   Medicare Important Message Given:  Yes    [name] Stefan ChurchBratton 12/27/2016, 1:53 PM

## 2016-12-27 NOTE — Social Work (Signed)
Clinical Social Worker facilitated patient discharge including contacting patient family and facility to confirm patient discharge plans.  Clinical information faxed to facility and family agreeable with plan.  CSW arranged ambulance transport via [facility] to [facility] .  RN to call [phone] report prior to discharge.  Clinical Social Worker will sign off for now as social work intervention is no longer needed. Please consult us again if new need arises.  Albertia Carvin, LCSW Clinical Social Worker [phone]    

## 2016-12-27 NOTE — Progress Notes (Signed)
Patient discharge instructions reviewed with patient and family. Patient and family verbalize understanding. RN called report to SNF. Patient is waiting on transport. Patient is not in distress.

## 2016-12-27 NOTE — Progress Notes (Signed)
Reviewed AVS with patient, her husband and their daughter.  Answered their questions.

## 2016-12-28 ENCOUNTER — Encounter (HOSPITAL_COMMUNITY): Payer: Self-pay | Admitting: Orthopedic Surgery

## 2016-12-28 DIAGNOSIS — S72101D Unspecified trochanteric fracture of right femur, subsequent encounter for closed fracture with routine healing: Secondary | ICD-10-CM | POA: Diagnosis not present

## 2016-12-28 DIAGNOSIS — F419 Anxiety disorder, unspecified: Secondary | ICD-10-CM | POA: Diagnosis not present

## 2016-12-28 DIAGNOSIS — D509 Iron deficiency anemia, unspecified: Secondary | ICD-10-CM | POA: Diagnosis not present

## 2016-12-28 DIAGNOSIS — F0391 Unspecified dementia with behavioral disturbance: Secondary | ICD-10-CM | POA: Diagnosis not present

## 2016-12-28 DIAGNOSIS — Z9181 History of falling: Secondary | ICD-10-CM | POA: Diagnosis not present

## 2016-12-29 DIAGNOSIS — L309 Dermatitis, unspecified: Secondary | ICD-10-CM | POA: Diagnosis not present

## 2016-12-29 DIAGNOSIS — F0390 Unspecified dementia without behavioral disturbance: Secondary | ICD-10-CM | POA: Diagnosis not present

## 2016-12-29 DIAGNOSIS — F329 Major depressive disorder, single episode, unspecified: Secondary | ICD-10-CM | POA: Diagnosis not present

## 2016-12-29 DIAGNOSIS — Z9181 History of falling: Secondary | ICD-10-CM | POA: Diagnosis not present

## 2017-01-05 DIAGNOSIS — S72141D Displaced intertrochanteric fracture of right femur, subsequent encounter for closed fracture with routine healing: Secondary | ICD-10-CM | POA: Diagnosis not present

## 2017-01-30 DIAGNOSIS — F419 Anxiety disorder, unspecified: Secondary | ICD-10-CM | POA: Diagnosis not present

## 2017-01-30 DIAGNOSIS — F0390 Unspecified dementia without behavioral disturbance: Secondary | ICD-10-CM | POA: Diagnosis not present

## 2017-01-30 DIAGNOSIS — F329 Major depressive disorder, single episode, unspecified: Secondary | ICD-10-CM | POA: Diagnosis not present

## 2017-01-30 DIAGNOSIS — G4700 Insomnia, unspecified: Secondary | ICD-10-CM | POA: Diagnosis not present

## 2017-01-31 ENCOUNTER — Telehealth: Payer: Self-pay | Admitting: Family Medicine

## 2017-01-31 NOTE — Telephone Encounter (Signed)
The pharmacy is calling to see if Dr. Clelia CroftShaw will be willing to discontinue the lipator best number [phone]

## 2017-02-02 DIAGNOSIS — G609 Hereditary and idiopathic neuropathy, unspecified: Secondary | ICD-10-CM | POA: Diagnosis not present

## 2017-02-02 DIAGNOSIS — F0390 Unspecified dementia without behavioral disturbance: Secondary | ICD-10-CM | POA: Diagnosis not present

## 2017-02-02 DIAGNOSIS — Z9181 History of falling: Secondary | ICD-10-CM | POA: Diagnosis not present

## 2017-02-02 DIAGNOSIS — I10 Essential (primary) hypertension: Secondary | ICD-10-CM | POA: Diagnosis not present

## 2017-02-02 DIAGNOSIS — E039 Hypothyroidism, unspecified: Secondary | ICD-10-CM | POA: Diagnosis not present

## 2017-02-02 DIAGNOSIS — S72141D Displaced intertrochanteric fracture of right femur, subsequent encounter for closed fracture with routine healing: Secondary | ICD-10-CM | POA: Diagnosis not present

## 2017-02-02 DIAGNOSIS — F419 Anxiety disorder, unspecified: Secondary | ICD-10-CM | POA: Diagnosis not present

## 2017-02-02 DIAGNOSIS — M6281 Muscle weakness (generalized): Secondary | ICD-10-CM | POA: Diagnosis not present

## 2017-02-02 DIAGNOSIS — R2681 Unsteadiness on feet: Secondary | ICD-10-CM | POA: Diagnosis not present

## 2017-02-02 DIAGNOSIS — S72143A Displaced intertrochanteric fracture of unspecified femur, initial encounter for closed fracture: Secondary | ICD-10-CM | POA: Diagnosis not present

## 2017-02-02 DIAGNOSIS — D649 Anemia, unspecified: Secondary | ICD-10-CM | POA: Diagnosis not present

## 2017-02-02 DIAGNOSIS — E785 Hyperlipidemia, unspecified: Secondary | ICD-10-CM | POA: Diagnosis not present

## 2017-02-02 DIAGNOSIS — G4700 Insomnia, unspecified: Secondary | ICD-10-CM | POA: Diagnosis not present

## 2017-02-02 NOTE — Telephone Encounter (Signed)
Pharmacist consultant is questioning why patient needs statin therapy due to age factor and not beneficial.  Please advise if we can d/c

## 2017-02-02 NOTE — Telephone Encounter (Signed)
Absolutely can be d/c'd. Appreciate the suggestion. Taken off med list.

## 2017-02-09 DIAGNOSIS — S72143A Displaced intertrochanteric fracture of unspecified femur, initial encounter for closed fracture: Secondary | ICD-10-CM | POA: Diagnosis not present

## 2017-02-09 DIAGNOSIS — R2681 Unsteadiness on feet: Secondary | ICD-10-CM | POA: Diagnosis not present

## 2017-02-09 DIAGNOSIS — M6281 Muscle weakness (generalized): Secondary | ICD-10-CM | POA: Diagnosis not present

## 2017-02-14 DIAGNOSIS — S72141D Displaced intertrochanteric fracture of right femur, subsequent encounter for closed fracture with routine healing: Secondary | ICD-10-CM | POA: Diagnosis not present

## 2017-02-14 DIAGNOSIS — F419 Anxiety disorder, unspecified: Secondary | ICD-10-CM | POA: Diagnosis not present

## 2017-02-14 DIAGNOSIS — F329 Major depressive disorder, single episode, unspecified: Secondary | ICD-10-CM | POA: Diagnosis not present

## 2017-02-14 DIAGNOSIS — I10 Essential (primary) hypertension: Secondary | ICD-10-CM | POA: Diagnosis not present

## 2017-02-14 DIAGNOSIS — F0390 Unspecified dementia without behavioral disturbance: Secondary | ICD-10-CM | POA: Diagnosis not present

## 2017-02-14 DIAGNOSIS — M1990 Unspecified osteoarthritis, unspecified site: Secondary | ICD-10-CM | POA: Diagnosis not present

## 2017-02-15 ENCOUNTER — Telehealth: Payer: Self-pay | Admitting: Family Medicine

## 2017-02-15 NOTE — Telephone Encounter (Signed)
THIS MESSAGE IS FROM NICOLE R.N. FROM [facility] FOR [facility]. SH[facility]: PATIENT HAD A FALL AND WAS ADMITTED TO THE HOSPITAL (SHE IS NOT SURE WHICH ONE). SHE HAD A ORIF OF THE (R) HIP. SHE WAS DISCHARGED FROM [facility]. WITH NEW ORDERS FOR HOME HEALTH SERVICES. VERBAL AUTHORIZATION IS NEEDED FOR SKILLED NURSING 2 TIMES A WEEK FOR 6 WEEKS. ALSO DURING HER ACCESSMENT PATIENT HAD LABORED BREATHING BUT SHE DENIED ANY SOB. HER OXYGEN WAS ABOVE 90. THIS NEEDS TO BE RECORDED BECAUSE IT WAS ABNORMAL. HER LUNG SOUNDS WERE COARSE. BEST PHONE [phone] ([facility] MBC

## 2017-02-16 NOTE — Telephone Encounter (Signed)
Thanks for giving the verbal for home health. Agree. Likely atelectasis due to deconditioning but if continues or pt develops a cough or becomes tachycardic, she needs to be eval asap as also at high risk for pneumonia or PEW. Please ask that they call with update on her pulmonary status and vitals within a week and if not normal, needs to be seen asap. Thanks.

## 2017-02-16 NOTE — Telephone Encounter (Signed)
Verbal orders given for home healthcare

## 2017-02-17 DIAGNOSIS — F329 Major depressive disorder, single episode, unspecified: Secondary | ICD-10-CM | POA: Diagnosis not present

## 2017-02-17 DIAGNOSIS — I10 Essential (primary) hypertension: Secondary | ICD-10-CM | POA: Diagnosis not present

## 2017-02-17 DIAGNOSIS — F419 Anxiety disorder, unspecified: Secondary | ICD-10-CM | POA: Diagnosis not present

## 2017-02-17 DIAGNOSIS — M1990 Unspecified osteoarthritis, unspecified site: Secondary | ICD-10-CM | POA: Diagnosis not present

## 2017-02-17 DIAGNOSIS — F0390 Unspecified dementia without behavioral disturbance: Secondary | ICD-10-CM | POA: Diagnosis not present

## 2017-02-17 DIAGNOSIS — S72141D Displaced intertrochanteric fracture of right femur, subsequent encounter for closed fracture with routine healing: Secondary | ICD-10-CM | POA: Diagnosis not present

## 2017-02-20 DIAGNOSIS — M1990 Unspecified osteoarthritis, unspecified site: Secondary | ICD-10-CM | POA: Diagnosis not present

## 2017-02-20 DIAGNOSIS — S72141D Displaced intertrochanteric fracture of right femur, subsequent encounter for closed fracture with routine healing: Secondary | ICD-10-CM | POA: Diagnosis not present

## 2017-02-20 DIAGNOSIS — F329 Major depressive disorder, single episode, unspecified: Secondary | ICD-10-CM | POA: Diagnosis not present

## 2017-02-20 DIAGNOSIS — F0390 Unspecified dementia without behavioral disturbance: Secondary | ICD-10-CM | POA: Diagnosis not present

## 2017-02-20 DIAGNOSIS — F419 Anxiety disorder, unspecified: Secondary | ICD-10-CM | POA: Diagnosis not present

## 2017-02-20 DIAGNOSIS — I10 Essential (primary) hypertension: Secondary | ICD-10-CM | POA: Diagnosis not present

## 2017-02-21 ENCOUNTER — Telehealth: Payer: Self-pay | Admitting: Family Medicine

## 2017-02-21 DIAGNOSIS — F419 Anxiety disorder, unspecified: Secondary | ICD-10-CM | POA: Diagnosis not present

## 2017-02-21 DIAGNOSIS — F0390 Unspecified dementia without behavioral disturbance: Secondary | ICD-10-CM | POA: Diagnosis not present

## 2017-02-21 DIAGNOSIS — I10 Essential (primary) hypertension: Secondary | ICD-10-CM | POA: Diagnosis not present

## 2017-02-21 DIAGNOSIS — M1990 Unspecified osteoarthritis, unspecified site: Secondary | ICD-10-CM | POA: Diagnosis not present

## 2017-02-21 DIAGNOSIS — F329 Major depressive disorder, single episode, unspecified: Secondary | ICD-10-CM | POA: Diagnosis not present

## 2017-02-21 DIAGNOSIS — S72141D Displaced intertrochanteric fracture of right femur, subsequent encounter for closed fracture with routine healing: Secondary | ICD-10-CM | POA: Diagnosis not present

## 2017-02-21 NOTE — Telephone Encounter (Signed)
[facility] SHE NEEDS TO GET VERBAL ORDERS FOR PHYSICAL THERAPY FOR 2 TIMES A WEEK FOR 6 WEEKS. BEST PHONE [phone] (GR[facility] MBC

## 2017-02-22 DIAGNOSIS — F419 Anxiety disorder, unspecified: Secondary | ICD-10-CM | POA: Diagnosis not present

## 2017-02-22 DIAGNOSIS — F329 Major depressive disorder, single episode, unspecified: Secondary | ICD-10-CM | POA: Diagnosis not present

## 2017-02-22 DIAGNOSIS — F0390 Unspecified dementia without behavioral disturbance: Secondary | ICD-10-CM | POA: Diagnosis not present

## 2017-02-22 DIAGNOSIS — I10 Essential (primary) hypertension: Secondary | ICD-10-CM | POA: Diagnosis not present

## 2017-02-22 DIAGNOSIS — S72141D Displaced intertrochanteric fracture of right femur, subsequent encounter for closed fracture with routine healing: Secondary | ICD-10-CM | POA: Diagnosis not present

## 2017-02-22 DIAGNOSIS — M1990 Unspecified osteoarthritis, unspecified site: Secondary | ICD-10-CM | POA: Diagnosis not present

## 2017-02-23 ENCOUNTER — Ambulatory Visit: Payer: Medicare Other | Admitting: Family Medicine

## 2017-02-23 DIAGNOSIS — F419 Anxiety disorder, unspecified: Secondary | ICD-10-CM | POA: Diagnosis not present

## 2017-02-23 DIAGNOSIS — M1990 Unspecified osteoarthritis, unspecified site: Secondary | ICD-10-CM | POA: Diagnosis not present

## 2017-02-23 DIAGNOSIS — S72141D Displaced intertrochanteric fracture of right femur, subsequent encounter for closed fracture with routine healing: Secondary | ICD-10-CM | POA: Diagnosis not present

## 2017-02-23 DIAGNOSIS — I10 Essential (primary) hypertension: Secondary | ICD-10-CM | POA: Diagnosis not present

## 2017-02-23 DIAGNOSIS — F0390 Unspecified dementia without behavioral disturbance: Secondary | ICD-10-CM | POA: Diagnosis not present

## 2017-02-23 DIAGNOSIS — F329 Major depressive disorder, single episode, unspecified: Secondary | ICD-10-CM | POA: Diagnosis not present

## 2017-02-24 DIAGNOSIS — F0390 Unspecified dementia without behavioral disturbance: Secondary | ICD-10-CM | POA: Diagnosis not present

## 2017-02-24 DIAGNOSIS — S72141D Displaced intertrochanteric fracture of right femur, subsequent encounter for closed fracture with routine healing: Secondary | ICD-10-CM | POA: Diagnosis not present

## 2017-02-24 DIAGNOSIS — F419 Anxiety disorder, unspecified: Secondary | ICD-10-CM | POA: Diagnosis not present

## 2017-02-24 DIAGNOSIS — I10 Essential (primary) hypertension: Secondary | ICD-10-CM | POA: Diagnosis not present

## 2017-02-24 DIAGNOSIS — F329 Major depressive disorder, single episode, unspecified: Secondary | ICD-10-CM | POA: Diagnosis not present

## 2017-02-24 DIAGNOSIS — M1990 Unspecified osteoarthritis, unspecified site: Secondary | ICD-10-CM | POA: Diagnosis not present

## 2017-02-27 DIAGNOSIS — F0390 Unspecified dementia without behavioral disturbance: Secondary | ICD-10-CM | POA: Diagnosis not present

## 2017-02-27 DIAGNOSIS — I10 Essential (primary) hypertension: Secondary | ICD-10-CM | POA: Diagnosis not present

## 2017-02-27 DIAGNOSIS — M1990 Unspecified osteoarthritis, unspecified site: Secondary | ICD-10-CM | POA: Diagnosis not present

## 2017-02-27 DIAGNOSIS — F419 Anxiety disorder, unspecified: Secondary | ICD-10-CM | POA: Diagnosis not present

## 2017-02-27 DIAGNOSIS — S72141D Displaced intertrochanteric fracture of right femur, subsequent encounter for closed fracture with routine healing: Secondary | ICD-10-CM | POA: Diagnosis not present

## 2017-02-27 DIAGNOSIS — F329 Major depressive disorder, single episode, unspecified: Secondary | ICD-10-CM | POA: Diagnosis not present

## 2017-02-27 NOTE — Telephone Encounter (Signed)
Left message with verbal order to receive PT twice a week/ 6 weeks

## 2017-02-28 DIAGNOSIS — F329 Major depressive disorder, single episode, unspecified: Secondary | ICD-10-CM | POA: Diagnosis not present

## 2017-02-28 DIAGNOSIS — M1990 Unspecified osteoarthritis, unspecified site: Secondary | ICD-10-CM | POA: Diagnosis not present

## 2017-02-28 DIAGNOSIS — F419 Anxiety disorder, unspecified: Secondary | ICD-10-CM | POA: Diagnosis not present

## 2017-02-28 DIAGNOSIS — S72141D Displaced intertrochanteric fracture of right femur, subsequent encounter for closed fracture with routine healing: Secondary | ICD-10-CM | POA: Diagnosis not present

## 2017-02-28 DIAGNOSIS — F0390 Unspecified dementia without behavioral disturbance: Secondary | ICD-10-CM | POA: Diagnosis not present

## 2017-02-28 DIAGNOSIS — I10 Essential (primary) hypertension: Secondary | ICD-10-CM | POA: Diagnosis not present

## 2017-03-01 DIAGNOSIS — F329 Major depressive disorder, single episode, unspecified: Secondary | ICD-10-CM | POA: Diagnosis not present

## 2017-03-01 DIAGNOSIS — I10 Essential (primary) hypertension: Secondary | ICD-10-CM | POA: Diagnosis not present

## 2017-03-01 DIAGNOSIS — S72141D Displaced intertrochanteric fracture of right femur, subsequent encounter for closed fracture with routine healing: Secondary | ICD-10-CM | POA: Diagnosis not present

## 2017-03-01 DIAGNOSIS — M1990 Unspecified osteoarthritis, unspecified site: Secondary | ICD-10-CM | POA: Diagnosis not present

## 2017-03-01 DIAGNOSIS — F0390 Unspecified dementia without behavioral disturbance: Secondary | ICD-10-CM | POA: Diagnosis not present

## 2017-03-01 DIAGNOSIS — F419 Anxiety disorder, unspecified: Secondary | ICD-10-CM | POA: Diagnosis not present

## 2017-03-02 ENCOUNTER — Encounter: Payer: Self-pay | Admitting: Family Medicine

## 2017-03-02 ENCOUNTER — Ambulatory Visit (INDEPENDENT_AMBULATORY_CARE_PROVIDER_SITE_OTHER): Payer: Medicare Other | Admitting: Family Medicine

## 2017-03-02 VITALS — BP 133/74 | HR 84 | Temp 98.5°F | Resp 18 | Ht <= 58 in | Wt 128.4 lb

## 2017-03-02 DIAGNOSIS — E039 Hypothyroidism, unspecified: Secondary | ICD-10-CM | POA: Diagnosis not present

## 2017-03-02 DIAGNOSIS — F0390 Unspecified dementia without behavioral disturbance: Secondary | ICD-10-CM | POA: Diagnosis not present

## 2017-03-02 DIAGNOSIS — I10 Essential (primary) hypertension: Secondary | ICD-10-CM | POA: Diagnosis not present

## 2017-03-02 DIAGNOSIS — F329 Major depressive disorder, single episode, unspecified: Secondary | ICD-10-CM | POA: Diagnosis not present

## 2017-03-02 DIAGNOSIS — S72001S Fracture of unspecified part of neck of right femur, sequela: Secondary | ICD-10-CM

## 2017-03-02 DIAGNOSIS — E441 Mild protein-calorie malnutrition: Secondary | ICD-10-CM

## 2017-03-02 DIAGNOSIS — D649 Anemia, unspecified: Secondary | ICD-10-CM

## 2017-03-02 DIAGNOSIS — S72141D Displaced intertrochanteric fracture of right femur, subsequent encounter for closed fracture with routine healing: Secondary | ICD-10-CM | POA: Diagnosis not present

## 2017-03-02 DIAGNOSIS — G4700 Insomnia, unspecified: Secondary | ICD-10-CM | POA: Diagnosis not present

## 2017-03-02 DIAGNOSIS — Z5181 Encounter for therapeutic drug level monitoring: Secondary | ICD-10-CM | POA: Diagnosis not present

## 2017-03-02 DIAGNOSIS — F419 Anxiety disorder, unspecified: Secondary | ICD-10-CM | POA: Diagnosis not present

## 2017-03-02 DIAGNOSIS — M1990 Unspecified osteoarthritis, unspecified site: Secondary | ICD-10-CM | POA: Diagnosis not present

## 2017-03-02 DIAGNOSIS — I679 Cerebrovascular disease, unspecified: Secondary | ICD-10-CM

## 2017-03-02 LAB — POCT URINALYSIS DIP (MANUAL ENTRY)
Bilirubin, UA: NEGATIVE
Blood, UA: NEGATIVE
GLUCOSE UA: NEGATIVE mg/dL
Leukocytes, UA: NEGATIVE
Nitrite, UA: NEGATIVE
Protein Ur, POC: NEGATIVE mg/dL
Spec Grav, UA: 1.015 (ref 1.010–1.025)
UROBILINOGEN UA: 0.2 U/dL
pH, UA: 5.5 (ref 5.0–8.0)

## 2017-03-02 MED ORDER — MELOXICAM 15 MG PO TABS: 15.0000 mg | ORAL_TABLET | Freq: Every day | ORAL | 0 refills | Status: DC

## 2017-03-02 NOTE — Progress Notes (Signed)
Subjective:    Patient ID: Brianna Reynolds, female    DOB: 16-Nov-1923, 83 y.o.   MRN: [id number] Chief Complaint  Patient presents with  . Hospitalization Follow-up    L hip fracture    HPI  [name] is a delightful 83 year old woman with moderate to advanced dementia who presents after surgical repair of a closed right hip fracture on 12/23/16 after which she completed skilled nursing rehabilitation at [facility] and was discharged 2 weeks prior - 02/12/17.  She now has [facility]. The SNF d/c papers are avail for my review.  SNF med list at d/c -  Stopped: asa 81, align probiotic, CLONAZEPAM, lovenex Changed: Levothyroxine was increased from 75 to 100 mcg.  Sertraline was increased from 75 to 110m (though this may have been done prior) Started: Does still have her on hydrocodone prn - using?? [facility]. She is weightbearing as tolerated with a walker.  She did require a blood transfusion in the hospital with post transfusion hemoglobin of 10.  D[facility] OV 10/12/16. Does not have appt sched.  Past Medical History:  Diagnosis Date  . Abnormal EKG   . Anxiety   . C. difficile colitis   . Cataract   . Fatigue   . HTN (hypertension)    New  . Hyperlipidemia   . Hypothyroid   . Memory loss   . Pneumonia    Past Surgical History:  Procedure Laterality Date  . BREAST SURGERY Bilateral   . COLONOSCOPY    . COLONOSCOPY N/A 10/27/2014   Procedure: COLONOSCOPY;  Surgeon: CGatha Mayer MD;  [facility]  Service: Endoscopy;  Laterality: N/A;  . INTRAMEDULLARY (IM) NAIL INTERTROCHANTERIC Right 12/23/2016   Procedure: RIGHT INTRAMEDULLARY (IM) NAIL INTERTROCHANTRIC;  Surgeon: SRod Can MD;  [place]  Service: Orthopedics;  Laterality: Right;  . PARATHYROIDECTOMY    . THYROIDECTOMY, PARTIAL    . TONSILLECTOMY     Current  Outpatient Prescriptions on File Prior to Visit  Medication Sig Dispense Refill  . acetaminophen (TYLENOL) 325 MG tablet Take 650 mg by mouth every 6 (six) hours as needed for headache (headache).    .Marland Kitchenaspirin EC 81 MG tablet Take 81 mg by mouth daily.    . bifidobacterium infantis (ALIGN) capsule Take 1 capsule by mouth daily. 30 capsule 12  . Calcium Carbonate (CALTRATE 600 PO) Take 1 tablet by mouth daily.    . Cholecalciferol (VITAMIN D) 2000 UNITS CAPS Take 1 capsule by mouth daily.     .Marland Kitchendonepezil (ARICEPT) 10 MG tablet Take 10 mg by mouth.    .Marland KitchenHYDROcodone-acetaminophen (NORCO/VICODIN) 5-325 MG tablet Take 1-2 tablets by mouth every 6 (six) hours as needed for moderate pain. 30 tablet 0  . levothyroxine (SYNTHROID, LEVOTHROID) 75 MCG tablet Take 1 tablet (75 mcg total) by mouth daily before breakfast. 90 tablet 1  . Melatonin 5 MG TABS Take 5 mg by mouth at bedtime.     . Multiple Vitamins-Minerals (PRESERVISION AREDS) CAPS Take 1 capsule by mouth daily.    . QUEtiapine (SEROQUEL) 25 MG tablet TAKE 1 TABLET AT BEDTIME AS NEEDED FOR INSOMNIA, MAY REPEAT IN 1-2 HOURS IF NEEDED 60 tablet 11  . sertraline (ZOLOFT) 50 MG tablet TAKE 1&1/2 TABLETS ONCE DAILY. (Patient taking differently: TAKE 1025mONCE DAILY.) 45 tablet 1  . tolterodine (DETROL  LA) 4 MG 24 hr capsule TAKE (1) CAPSULE DAILY. 90 capsule 1   No current facility-administered medications on file prior to visit.    Allergies  Allergen Reactions  . Penicillins Rash   Family History  Problem Relation Age of Onset  . Arthritis Mother    Social History   Social History  . Marital status: Married    Spouse name: N/A  . Number of children: 5  . Years of education: N/A   Occupational History  . house wife    Social History Main Topics  . Smoking status: Former Smoker    Types: Cigarettes    Quit date: 02/10/1991  . Smokeless tobacco: Never Used  . Alcohol use No     Comment: rarely  . Drug use: No  . Sexual activity:  Not Asked   Other Topics Concern  . None   Social History Narrative  . None   Depression screen [facility] 2/9 03/02/2017 12/15/2016 08/04/2016 07/04/2016 01/20/2016  Decreased Interest 0 0 0 0 0  Down, Depressed, Hopeless 0 0 0 0 0  PHQ - 2 Score 0 0 0 0 0     Review of Systems See hpi    Objective:   Physical Exam  Constitutional: She appears well-developed and well-nourished. No distress.  HENT:  Head: Normocephalic and atraumatic.  Right Ear: External ear normal.  Left Ear: External ear normal.  Eyes: Conjunctivae are normal. No scleral icterus.  Neck: Normal range of motion. Neck supple. No thyromegaly present.  Cardiovascular: Normal rate, regular rhythm, normal heart sounds and intact distal pulses.   Pulmonary/Chest: Effort normal and breath sounds normal. No respiratory distress.  Musculoskeletal: She exhibits no edema.  Lymphadenopathy:    She has no cervical adenopathy.  Neurological: She is alert. She is disoriented. Gait (using walker) abnormal.  Skin: Skin is warm and dry. She is not diaphoretic. No erythema.  Psychiatric: She has a normal mood and affect. Her speech is normal and behavior is normal. Cognition and memory are impaired. She does not express impulsivity. She exhibits abnormal recent memory and abnormal remote memory.   BP 133/74   Pulse 84   Temp 98.5 F (36.9 C) (Oral)   Resp 18   Ht _0  (1.473 m)   Wt 128 lb 6.4 oz (58.2 kg)   SpO2 97%   BMI 26.84 kg/m   12/30/16 labs reviewed - LDL 80, non HDL 103; with CMP Cr 0.64 eGFR 79 calcium low 8.5, protein low 5.5, albumin low 3.0 Do not have tsh abnml result that prompted levothyroxine dose change, do not have repeat cbc- appears to be missing the second page of labs.    Assessment & Plan:  Has appt w/ me sched in 5 mos - Jan for AWV.  1. Hypothyroidism, unspecified type   2. Cerebrovascular small vessel disease   3. Dementia without behavioral disturbance, unspecified dementia type   4. Closed  fracture of right hip, sequela   5. Insomnia, unspecified type   6. Anemia, unspecified type   7. Mild malnutrition ([place])   8. Medication monitoring encounter     Orders Placed This Encounter  Procedures  . Comprehensive metabolic panel  . TSH  . CBC  . POCT urinalysis dipstick    Meds ordered this encounter  Medications  . meloxicam (MOBIC) 15 MG tablet    Sig: Take 1 tablet (15 mg total) by mouth daily.    Dispense:  30 tablet    Refill:  0     Delman Cheadle, M.D.  Primary Care at [facility] [address] [place], [place] [address] [phone] phone [phone] fax  03/03/17 1:08 AM

## 2017-03-02 NOTE — Patient Instructions (Addendum)
If the knee is not better by Monday, please call [phone] to get an appointment with me on Wednesday to get an xray of the knee.    IF you received an x-ray today, you will receive an invoice from [facility]. Please contact [facility] at [phone] with questions or concerns regarding your invoice.   IF you received labwork today, you will receive an invoice from [facility]. Please contact [facility] at [phone] with questions or concerns regarding your invoice.   Our billing staff will not be able to assist you with questions regarding bills from these companies.  You will be contacted with the lab results as soon as they are available. The fastest way to get your results is to activate your My Chart account. Instructions are located on the last page of this paperwork. If you have not heard from us regarding the results in 2 weeks, please contact this office.      RICE for Routine Care of Injuries Theroutine careofmanyinjuriesincludes rest, ice, compression, and elevation (RICE therapy). RICE therapy is often recommended for injuries to soft tissues, such as a muscle strain, ligament injuries, bruises, and overuse injuries. It can also be used for some bony injuries. Using RICE therapy can help to relieve pain, lessen swelling, and enable your body to heal. Rest Rest is required to allow your body to heal. This usually involves reducing your normal activities and avoiding use of the injured part of your body. Generally, you can return to your normal activities when you are comfortable and have been given permission by your health care provider. Ice  Icing your injury helps to keep the swelling down, and it lessens pain. Do not apply ice directly to your skin.  Put ice in a plastic bag.  Place a towel between your skin and the bag.  Leave the ice on for 20 minutes, 2-3 times a day.  Do this for as long as you are directed by your health care  provider. Compression Compression means putting pressure on the injured area. Compression helps to keep swelling down, gives support, and helps with discomfort. Compression may be done with an elastic bandage. If an elastic bandage has been applied, follow these general tips:  Remove and reapply the bandage every 3-4 hours or as directed by your health care provider.  Make sure the bandage is not wrapped too tightly, because this can cut off circulation. If part of your body beyond the bandage becomes blue, numb, cold, swollen, or more painful, your bandage is most likely too tight. If this occurs, remove your bandage and reapply it more loosely.  See your health care provider if the bandage seems to be making your problems worse rather than better.  Elevation  Elevation means keeping the injured area raised. This helps to lessen swelling and decrease pain. If possible, your injured area should be elevated at or above the level of your heart or the center of your chest. When should I seek medical care?  If your pain and swelling continue.  If your symptoms are getting worse rather than improving. These symptoms may indicate that further evaluation or further X-rays are needed. Sometimes, X-rays may not show a small broken bone (fracture) until a number of days later. Make a follow-up appointment with your health care provider. When should I seek immediate medical care?  If you have sudden severe pain at or below the area of your injury.  If you have redness or increased swelling around your injury.  If you have tingling or numbness at or below the area of your injury that does not improve after you remove the elastic bandage. This information is not intended to replace advice given to you by your health care provider. Make sure you discuss any questions you have with your health care provider. Document Released: 10/30/2000 Document Revised: 12/22/2015 Document Reviewed:  06/25/2014 Elsevier Interactive Patient Education  2017 [employer]

## 2017-03-03 LAB — CBC
HEMATOCRIT: 43.3 % (ref 34.0–46.6)
Hemoglobin: 14.5 g/dL (ref 11.1–15.9)
MCH: 29.5 pg (ref 26.6–33.0)
MCHC: 33.5 g/dL (ref 31.5–35.7)
MCV: 88 fL (ref 79–97)
PLATELETS: 339 10*3/uL (ref 150–379)
RBC: 4.91 x10E6/uL (ref 3.77–5.28)
RDW: 14.9 % (ref 12.3–15.4)
WBC: 8.8 10*3/uL (ref 3.4–10.8)

## 2017-03-03 LAB — COMPREHENSIVE METABOLIC PANEL
ALT: 11 IU/L (ref 0–32)
AST: 12 IU/L (ref 0–40)
Albumin/Globulin Ratio: 1.8 (ref 1.2–2.2)
Albumin: 4.2 g/dL (ref 3.2–4.6)
Alkaline Phosphatase: 119 IU/L — ABNORMAL HIGH (ref 39–117)
BUN/Creatinine Ratio: 14 (ref 12–28)
BUN: 10 mg/dL (ref 10–36)
Bilirubin Total: 0.3 mg/dL (ref 0.0–1.2)
CALCIUM: 9.9 mg/dL (ref 8.7–10.3)
CO2: 20 mmol/L (ref 20–29)
CREATININE: 0.71 mg/dL (ref 0.57–1.00)
Chloride: 101 mmol/L (ref 96–106)
GFR, EST AFRICAN AMERICAN: 87 mL/min/{1.73_m2} (ref >59)
GFR, EST NON AFRICAN AMERICAN: 75 mL/min/{1.73_m2} (ref >59)
GLOBULIN, TOTAL: 2.4 g/dL (ref 1.5–4.5)
Glucose: 83 mg/dL (ref 65–99)
POTASSIUM: 4.2 mmol/L (ref 3.5–5.2)
Sodium: 139 mmol/L (ref 134–144)
TOTAL PROTEIN: 6.6 g/dL (ref 6.0–8.5)

## 2017-03-03 LAB — TSH: TSH: 0.284 u[IU]/mL — ABNORMAL LOW (ref 0.450–4.500)

## 2017-03-07 DIAGNOSIS — F419 Anxiety disorder, unspecified: Secondary | ICD-10-CM | POA: Diagnosis not present

## 2017-03-07 DIAGNOSIS — F329 Major depressive disorder, single episode, unspecified: Secondary | ICD-10-CM | POA: Diagnosis not present

## 2017-03-07 DIAGNOSIS — S72141D Displaced intertrochanteric fracture of right femur, subsequent encounter for closed fracture with routine healing: Secondary | ICD-10-CM | POA: Diagnosis not present

## 2017-03-07 DIAGNOSIS — M1990 Unspecified osteoarthritis, unspecified site: Secondary | ICD-10-CM | POA: Diagnosis not present

## 2017-03-07 DIAGNOSIS — F0390 Unspecified dementia without behavioral disturbance: Secondary | ICD-10-CM | POA: Diagnosis not present

## 2017-03-07 DIAGNOSIS — I10 Essential (primary) hypertension: Secondary | ICD-10-CM | POA: Diagnosis not present

## 2017-03-08 ENCOUNTER — Ambulatory Visit (INDEPENDENT_AMBULATORY_CARE_PROVIDER_SITE_OTHER): Payer: Medicare Other | Admitting: Family Medicine

## 2017-03-08 ENCOUNTER — Encounter: Payer: Self-pay | Admitting: Family Medicine

## 2017-03-08 VITALS — BP 149/66 | HR 72 | Temp 98.6°F | Resp 18 | Ht 59.0 in | Wt 132.0 lb

## 2017-03-08 DIAGNOSIS — S72001S Fracture of unspecified part of neck of right femur, sequela: Secondary | ICD-10-CM | POA: Diagnosis not present

## 2017-03-08 DIAGNOSIS — F329 Major depressive disorder, single episode, unspecified: Secondary | ICD-10-CM | POA: Diagnosis not present

## 2017-03-08 DIAGNOSIS — F0390 Unspecified dementia without behavioral disturbance: Secondary | ICD-10-CM | POA: Diagnosis not present

## 2017-03-08 DIAGNOSIS — S72141D Displaced intertrochanteric fracture of right femur, subsequent encounter for closed fracture with routine healing: Secondary | ICD-10-CM | POA: Diagnosis not present

## 2017-03-08 DIAGNOSIS — M25461 Effusion, right knee: Secondary | ICD-10-CM | POA: Diagnosis not present

## 2017-03-08 DIAGNOSIS — M1990 Unspecified osteoarthritis, unspecified site: Secondary | ICD-10-CM | POA: Diagnosis not present

## 2017-03-08 DIAGNOSIS — I10 Essential (primary) hypertension: Secondary | ICD-10-CM | POA: Diagnosis not present

## 2017-03-08 DIAGNOSIS — F419 Anxiety disorder, unspecified: Secondary | ICD-10-CM | POA: Diagnosis not present

## 2017-03-08 DIAGNOSIS — M25561 Pain in right knee: Secondary | ICD-10-CM | POA: Diagnosis not present

## 2017-03-08 NOTE — Patient Instructions (Signed)
     IF you received an x-ray today, you will receive an invoice from [facility]. Please contact [facility] at [phone] with questions or concerns regarding your invoice.   IF you received labwork today, you will receive an invoice from [facility]. Please contact [facility] at [phone] with questions or concerns regarding your invoice.   Our billing staff will not be able to assist you with questions regarding bills from these companies.  You will be contacted with the lab results as soon as they are available. The fastest way to get your results is to activate your My Chart account. Instructions are located on the last page of this paperwork. If you have not heard from us regarding the results in 2 weeks, please contact this office.     

## 2017-03-09 DIAGNOSIS — I10 Essential (primary) hypertension: Secondary | ICD-10-CM | POA: Diagnosis not present

## 2017-03-09 DIAGNOSIS — F329 Major depressive disorder, single episode, unspecified: Secondary | ICD-10-CM | POA: Diagnosis not present

## 2017-03-09 DIAGNOSIS — F419 Anxiety disorder, unspecified: Secondary | ICD-10-CM | POA: Diagnosis not present

## 2017-03-09 DIAGNOSIS — S72141D Displaced intertrochanteric fracture of right femur, subsequent encounter for closed fracture with routine healing: Secondary | ICD-10-CM | POA: Diagnosis not present

## 2017-03-09 DIAGNOSIS — F0390 Unspecified dementia without behavioral disturbance: Secondary | ICD-10-CM | POA: Diagnosis not present

## 2017-03-09 DIAGNOSIS — M1990 Unspecified osteoarthritis, unspecified site: Secondary | ICD-10-CM | POA: Diagnosis not present

## 2017-03-11 NOTE — Progress Notes (Signed)
Subjective:    Patient ID: Brianna Reynolds, female    DOB: 1923/08/08, 83 y.o.   MRN: [id number] Chief Complaint  Patient presents with  . Knee Pain    Rt knee. Patient wants an xray [id number]     HPI  Having increasing right knee pain and swelling since she has been more active recovering from her right hip fracture/ORIF. Using walker. Has appt w/ ortho for f/u next week - Dr. Veda CanningSwintek.  She did not have her knee xrayed at any point after her injury.  Past Medical History:  Diagnosis Date  . Abnormal EKG   . Anxiety   . C. difficile colitis   . Cataract   . Fatigue   . HTN (hypertension)    New  . Hyperlipidemia   . Hypothyroid   . Memory loss   . Pneumonia    Past Surgical History:  Procedure Laterality Date  . BREAST SURGERY Bilateral   . COLONOSCOPY    . COLONOSCOPY N/A 10/27/2014   Procedure: COLONOSCOPY;  Surgeon: Iva Booparl E Gessner, MD;  [facility];  Service: Endoscopy;  Laterality: N/A;  . INTRAMEDULLARY (IM) NAIL INTERTROCHANTERIC Right 12/23/2016   Procedure: RIGHT INTRAMEDULLARY (IM) NAIL INTERTROCHANTRIC;  Surgeon: Samson FredericSwinteck, Brian, MD;  Location: [facility] OR;  Service: Orthopedics;  Laterality: Right;  . PARATHYROIDECTOMY    . THYROIDECTOMY, PARTIAL    . TONSILLECTOMY     Current Outpatient Prescriptions on File Prior to Visit  Medication Sig Dispense Refill  . acetaminophen (TYLENOL) 325 MG tablet Take 650 mg by mouth every 6 (six) hours as needed for headache (headache).    Marland Kitchen. aspirin EC 81 MG tablet Take 81 mg by mouth daily.    . bifidobacterium infantis (ALIGN) capsule Take 1 capsule by mouth daily. 30 capsule 12  . Calcium Carbonate (CALTRATE 600 PO) Take 1 tablet by mouth daily.    . Cholecalciferol (VITAMIN D) 2000 UNITS CAPS Take 1 capsule by mouth daily.     Marland Kitchen. donepezil (ARICEPT) 10 MG tablet Take 10 mg by mouth.    Marland Kitchen. HYDROcodone-acetaminophen (NORCO/VICODIN) 5-325 MG tablet Take 1-2 tablets by mouth every 6 (six) hours as needed for moderate  pain. 30 tablet 0  . levothyroxine (SYNTHROID, LEVOTHROID) 75 MCG tablet Take 1 tablet (75 mcg total) by mouth daily before breakfast. 90 tablet 1  . Melatonin 5 MG TABS Take 5 mg by mouth at bedtime.     . meloxicam (MOBIC) 15 MG tablet Take 1 tablet (15 mg total) by mouth daily. 30 tablet 0  . Multiple Vitamins-Minerals (PRESERVISION AREDS) CAPS Take 1 capsule by mouth daily.    . QUEtiapine (SEROQUEL) 25 MG tablet TAKE 1 TABLET AT BEDTIME AS NEEDED FOR INSOMNIA, MAY REPEAT IN 1-2 HOURS IF NEEDED 60 tablet 11  . sertraline (ZOLOFT) 50 MG tablet TAKE 1&1/2 TABLETS ONCE DAILY. (Patient taking differently: TAKE 100mg  ONCE DAILY.) 45 tablet 1  . tolterodine (DETROL LA) 4 MG 24 hr capsule TAKE (1) CAPSULE DAILY. 90 capsule 1   No current facility-administered medications on file prior to visit.    Allergies  Allergen Reactions  . Penicillins Rash   Family History  Problem Relation Age of Onset  . Arthritis Mother    Social History   Social History  . Marital status: Married    Spouse name: N/A  . Number of children: 5  . Years of education: N/A   Occupational History  . house wife    Social History Main Topics  .  Smoking status: Former Smoker    Types: Cigarettes    Quit date: 02/10/1991  . Smokeless tobacco: Never Used  . Alcohol use No     Comment: rarely  . Drug use: No  . Sexual activity: Not Asked   Other Topics Concern  . None   Social History Narrative  . None   Depression screen [facility] 2/9 03/08/2017 03/02/2017 12/15/2016 08/04/2016 07/04/2016  Decreased Interest 0 0 0 0 0  Down, Depressed, Hopeless 0 0 0 0 0  PHQ - 2 Score 0 0 0 0 0     Review of Systems  Constitutional: Positive for activity change. Negative for appetite change, chills and fever.  Musculoskeletal: Positive for arthralgias, gait problem and joint swelling. Negative for myalgias.  Skin: Negative for color change and rash.  Neurological: Negative for weakness and numbness.  Psychiatric/Behavioral:  Positive for confusion. Negative for dysphoric mood and sleep disturbance.   See hpi    Objective:   Physical Exam  Constitutional: She is oriented to person, place, and time. She appears well-developed and well-nourished. No distress.  HENT:  Head: Normocephalic and atraumatic.  Right Ear: External ear normal.  Eyes: Conjunctivae are normal. No scleral icterus.  Pulmonary/Chest: Effort normal.  Neurological: She is alert and oriented to person, place, and time.  Skin: Skin is warm and dry. She is not diaphoretic. No erythema.  Psychiatric: She has a normal mood and affect. Her behavior is normal.    BP (!) 149/66   Pulse 72   Temp 98.6 F (37 C) (Oral)   Resp 18   Ht 4\' 11"  (1.499 m)   Wt 132 lb (59.9 kg)   SpO2 95%   BMI 26.66 kg/m      Assessment & Plan:   1. Acute pain of right knee - would prob benefit from xray to ensure only arthritic flair - unfortunately, are xray machine is down at this time so will defer to ortho f/u next wk. Pt and husband agreeable.   2. Effusion of right knee joint   3. Closed fracture of right hip, sequela      Norberto Sorenson, M.D.  Primary Care at [facility] [address] [place], [place] [address] [phone] phone [phone] fax  03/11/17 2:57 PM

## 2017-03-13 DIAGNOSIS — M1990 Unspecified osteoarthritis, unspecified site: Secondary | ICD-10-CM | POA: Diagnosis not present

## 2017-03-13 DIAGNOSIS — F329 Major depressive disorder, single episode, unspecified: Secondary | ICD-10-CM | POA: Diagnosis not present

## 2017-03-13 DIAGNOSIS — S72141D Displaced intertrochanteric fracture of right femur, subsequent encounter for closed fracture with routine healing: Secondary | ICD-10-CM | POA: Diagnosis not present

## 2017-03-13 DIAGNOSIS — F0390 Unspecified dementia without behavioral disturbance: Secondary | ICD-10-CM | POA: Diagnosis not present

## 2017-03-13 DIAGNOSIS — F419 Anxiety disorder, unspecified: Secondary | ICD-10-CM | POA: Diagnosis not present

## 2017-03-13 DIAGNOSIS — I10 Essential (primary) hypertension: Secondary | ICD-10-CM | POA: Diagnosis not present

## 2017-03-14 DIAGNOSIS — F0390 Unspecified dementia without behavioral disturbance: Secondary | ICD-10-CM | POA: Diagnosis not present

## 2017-03-14 DIAGNOSIS — S72141D Displaced intertrochanteric fracture of right femur, subsequent encounter for closed fracture with routine healing: Secondary | ICD-10-CM | POA: Diagnosis not present

## 2017-03-14 DIAGNOSIS — F419 Anxiety disorder, unspecified: Secondary | ICD-10-CM | POA: Diagnosis not present

## 2017-03-14 DIAGNOSIS — F329 Major depressive disorder, single episode, unspecified: Secondary | ICD-10-CM | POA: Diagnosis not present

## 2017-03-14 DIAGNOSIS — M1990 Unspecified osteoarthritis, unspecified site: Secondary | ICD-10-CM | POA: Diagnosis not present

## 2017-03-14 DIAGNOSIS — I10 Essential (primary) hypertension: Secondary | ICD-10-CM | POA: Diagnosis not present

## 2017-03-15 DIAGNOSIS — M1990 Unspecified osteoarthritis, unspecified site: Secondary | ICD-10-CM | POA: Diagnosis not present

## 2017-03-15 DIAGNOSIS — I10 Essential (primary) hypertension: Secondary | ICD-10-CM | POA: Diagnosis not present

## 2017-03-15 DIAGNOSIS — F329 Major depressive disorder, single episode, unspecified: Secondary | ICD-10-CM | POA: Diagnosis not present

## 2017-03-15 DIAGNOSIS — F0390 Unspecified dementia without behavioral disturbance: Secondary | ICD-10-CM | POA: Diagnosis not present

## 2017-03-15 DIAGNOSIS — S72141D Displaced intertrochanteric fracture of right femur, subsequent encounter for closed fracture with routine healing: Secondary | ICD-10-CM | POA: Diagnosis not present

## 2017-03-15 DIAGNOSIS — F419 Anxiety disorder, unspecified: Secondary | ICD-10-CM | POA: Diagnosis not present

## 2017-03-16 DIAGNOSIS — F419 Anxiety disorder, unspecified: Secondary | ICD-10-CM | POA: Diagnosis not present

## 2017-03-16 DIAGNOSIS — F329 Major depressive disorder, single episode, unspecified: Secondary | ICD-10-CM | POA: Diagnosis not present

## 2017-03-16 DIAGNOSIS — M1990 Unspecified osteoarthritis, unspecified site: Secondary | ICD-10-CM | POA: Diagnosis not present

## 2017-03-16 DIAGNOSIS — I10 Essential (primary) hypertension: Secondary | ICD-10-CM | POA: Diagnosis not present

## 2017-03-16 DIAGNOSIS — F0390 Unspecified dementia without behavioral disturbance: Secondary | ICD-10-CM | POA: Diagnosis not present

## 2017-03-16 DIAGNOSIS — S72141D Displaced intertrochanteric fracture of right femur, subsequent encounter for closed fracture with routine healing: Secondary | ICD-10-CM | POA: Diagnosis not present

## 2017-03-20 DIAGNOSIS — M1990 Unspecified osteoarthritis, unspecified site: Secondary | ICD-10-CM | POA: Diagnosis not present

## 2017-03-20 DIAGNOSIS — F0390 Unspecified dementia without behavioral disturbance: Secondary | ICD-10-CM | POA: Diagnosis not present

## 2017-03-20 DIAGNOSIS — F329 Major depressive disorder, single episode, unspecified: Secondary | ICD-10-CM | POA: Diagnosis not present

## 2017-03-20 DIAGNOSIS — S72141D Displaced intertrochanteric fracture of right femur, subsequent encounter for closed fracture with routine healing: Secondary | ICD-10-CM | POA: Diagnosis not present

## 2017-03-20 DIAGNOSIS — F419 Anxiety disorder, unspecified: Secondary | ICD-10-CM | POA: Diagnosis not present

## 2017-03-20 DIAGNOSIS — I10 Essential (primary) hypertension: Secondary | ICD-10-CM | POA: Diagnosis not present

## 2017-03-21 DIAGNOSIS — F0390 Unspecified dementia without behavioral disturbance: Secondary | ICD-10-CM | POA: Diagnosis not present

## 2017-03-21 DIAGNOSIS — F419 Anxiety disorder, unspecified: Secondary | ICD-10-CM | POA: Diagnosis not present

## 2017-03-21 DIAGNOSIS — I10 Essential (primary) hypertension: Secondary | ICD-10-CM | POA: Diagnosis not present

## 2017-03-21 DIAGNOSIS — F329 Major depressive disorder, single episode, unspecified: Secondary | ICD-10-CM | POA: Diagnosis not present

## 2017-03-21 DIAGNOSIS — S72141D Displaced intertrochanteric fracture of right femur, subsequent encounter for closed fracture with routine healing: Secondary | ICD-10-CM | POA: Diagnosis not present

## 2017-03-21 DIAGNOSIS — M1990 Unspecified osteoarthritis, unspecified site: Secondary | ICD-10-CM | POA: Diagnosis not present

## 2017-03-23 DIAGNOSIS — M1990 Unspecified osteoarthritis, unspecified site: Secondary | ICD-10-CM | POA: Diagnosis not present

## 2017-03-23 DIAGNOSIS — I10 Essential (primary) hypertension: Secondary | ICD-10-CM | POA: Diagnosis not present

## 2017-03-23 DIAGNOSIS — F0390 Unspecified dementia without behavioral disturbance: Secondary | ICD-10-CM | POA: Diagnosis not present

## 2017-03-23 DIAGNOSIS — F329 Major depressive disorder, single episode, unspecified: Secondary | ICD-10-CM | POA: Diagnosis not present

## 2017-03-23 DIAGNOSIS — S72141D Displaced intertrochanteric fracture of right femur, subsequent encounter for closed fracture with routine healing: Secondary | ICD-10-CM | POA: Diagnosis not present

## 2017-03-23 DIAGNOSIS — F419 Anxiety disorder, unspecified: Secondary | ICD-10-CM | POA: Diagnosis not present

## 2017-03-27 ENCOUNTER — Other Ambulatory Visit: Payer: Self-pay | Admitting: Family Medicine

## 2017-03-27 ENCOUNTER — Telehealth: Payer: Self-pay | Admitting: Family Medicine

## 2017-03-27 MED ORDER — LEVOTHYROXINE SODIUM 88 MCG PO TABS: 88.0000 ug | ORAL_TABLET | Freq: Every day | ORAL | 1 refills | Status: DC

## 2017-03-27 NOTE — Telephone Encounter (Signed)
Pharmacy called with refill request for levothyroxine 100 g. Patient had been increased to this from 75 g while she was in rehabilitation. TSH is too suppressed so DC'd both doses and will try patient on 88 g. Recheck TSH in approximately 3 months.

## 2017-03-28 DIAGNOSIS — F419 Anxiety disorder, unspecified: Secondary | ICD-10-CM | POA: Diagnosis not present

## 2017-03-28 DIAGNOSIS — F0390 Unspecified dementia without behavioral disturbance: Secondary | ICD-10-CM | POA: Diagnosis not present

## 2017-03-28 DIAGNOSIS — I10 Essential (primary) hypertension: Secondary | ICD-10-CM | POA: Diagnosis not present

## 2017-03-28 DIAGNOSIS — S72141D Displaced intertrochanteric fracture of right femur, subsequent encounter for closed fracture with routine healing: Secondary | ICD-10-CM | POA: Diagnosis not present

## 2017-03-28 DIAGNOSIS — M1990 Unspecified osteoarthritis, unspecified site: Secondary | ICD-10-CM | POA: Diagnosis not present

## 2017-03-28 DIAGNOSIS — F329 Major depressive disorder, single episode, unspecified: Secondary | ICD-10-CM | POA: Diagnosis not present

## 2017-03-30 DIAGNOSIS — S72141D Displaced intertrochanteric fracture of right femur, subsequent encounter for closed fracture with routine healing: Secondary | ICD-10-CM | POA: Diagnosis not present

## 2017-03-30 DIAGNOSIS — I10 Essential (primary) hypertension: Secondary | ICD-10-CM | POA: Diagnosis not present

## 2017-03-30 DIAGNOSIS — F419 Anxiety disorder, unspecified: Secondary | ICD-10-CM | POA: Diagnosis not present

## 2017-03-30 DIAGNOSIS — M1990 Unspecified osteoarthritis, unspecified site: Secondary | ICD-10-CM | POA: Diagnosis not present

## 2017-03-30 DIAGNOSIS — F0390 Unspecified dementia without behavioral disturbance: Secondary | ICD-10-CM | POA: Diagnosis not present

## 2017-03-30 DIAGNOSIS — F329 Major depressive disorder, single episode, unspecified: Secondary | ICD-10-CM | POA: Diagnosis not present

## 2017-04-18 ENCOUNTER — Other Ambulatory Visit: Payer: Self-pay | Admitting: Family Medicine

## 2017-04-18 DIAGNOSIS — G4700 Insomnia, unspecified: Secondary | ICD-10-CM

## 2017-04-18 DIAGNOSIS — M1711 Unilateral primary osteoarthritis, right knee: Secondary | ICD-10-CM | POA: Diagnosis not present

## 2017-04-25 DIAGNOSIS — M1711 Unilateral primary osteoarthritis, right knee: Secondary | ICD-10-CM | POA: Diagnosis not present

## 2017-04-25 DIAGNOSIS — G8929 Other chronic pain: Secondary | ICD-10-CM | POA: Diagnosis not present

## 2017-04-25 DIAGNOSIS — M25561 Pain in right knee: Secondary | ICD-10-CM | POA: Diagnosis not present

## 2017-05-02 DIAGNOSIS — M1711 Unilateral primary osteoarthritis, right knee: Secondary | ICD-10-CM | POA: Diagnosis not present

## 2017-06-06 DIAGNOSIS — Z961 Presence of intraocular lens: Secondary | ICD-10-CM | POA: Diagnosis not present

## 2017-06-06 DIAGNOSIS — H353132 Nonexudative age-related macular degeneration, bilateral, intermediate dry stage: Secondary | ICD-10-CM | POA: Diagnosis not present

## 2017-06-08 ENCOUNTER — Other Ambulatory Visit: Payer: Self-pay

## 2017-06-08 ENCOUNTER — Encounter: Payer: Self-pay | Admitting: Family Medicine

## 2017-06-08 ENCOUNTER — Ambulatory Visit (INDEPENDENT_AMBULATORY_CARE_PROVIDER_SITE_OTHER): Payer: Medicare Other | Admitting: Family Medicine

## 2017-06-08 VITALS — BP 110/54 | HR 86 | Temp 98.1°F | Resp 16 | Ht 59.0 in | Wt 129.2 lb

## 2017-06-08 DIAGNOSIS — Z23 Encounter for immunization: Secondary | ICD-10-CM | POA: Diagnosis not present

## 2017-06-08 DIAGNOSIS — E039 Hypothyroidism, unspecified: Secondary | ICD-10-CM

## 2017-06-08 MED ORDER — SERTRALINE HCL 100 MG PO TABS: 100.0000 mg | ORAL_TABLET | Freq: Every day | ORAL | 3 refills | Status: DC

## 2017-06-08 MED ORDER — MIRABEGRON ER 50 MG PO TB24: 50.0000 mg | ORAL_TABLET | Freq: Every day | ORAL | 5 refills | Status: DC

## 2017-06-08 MED ORDER — CETIRIZINE HCL 10 MG PO TABS: 10.0000 mg | ORAL_TABLET | Freq: Every day | ORAL | 2 refills | Status: DC

## 2017-06-08 NOTE — Progress Notes (Signed)
Prescription Mirabegron  Faxed to [facility]  Fax [phone]

## 2017-06-08 NOTE — Progress Notes (Signed)
   Subjective:    Patient ID: Brianna Reynolds, female    DOB: 1924/02/04, 83 y.o.   MRN: [id number]  HPI  Levthyrxine 100 mcg was decreased to 88 mcg 8/27 - here for recheck. [place] Dr. Hyacinth MeekerMiller.   Knee arthritis - the put 3 shots of gel in there and the last one was 6 weeks ago - she has an appt on 11/20 for repeat xray - it is no better. It still aches. Does #270 leg exercises and stil l hurts her knee to bend and flex hip. Walking well.  Does ache almost all the time though can ache. S  Still has some problems at night but not suring the day.   Review of Systems     Objective:   Physical Exam        Assessment & Plan:   1. Need for prophylactic vaccination and inoculation against influenza   2. Hypothyroidism, unspecified type     Orders Placed This Encounter  Procedures  . Flu Vaccine QUAD 6+ mos PF IM (Fluarix Quad PF)  . TSH    Meds ordered this encounter  Medications  . sertraline (ZOLOFT) 100 MG tablet    Sig: Take 1 tablet (100 mg total) daily by mouth.    Dispense:  90 tablet    Refill:  3    Please hold on file until current rx is complete  . mirabegron ER (MYRBETRIQ) 50 MG TB24 tablet    Sig: Take 1 tablet (50 mg total) daily by mouth.    Dispense:  30 tablet    Refill:  5    Hold on file for 1 month until requested Failed Detrol LA  . cetirizine (ZYRTEC) 10 MG tablet    Sig: Take 1 tablet (10 mg total) at bedtime by mouth.    Dispense:  30 tablet    Refill:  2    Norberto SorensonEva Atiana Levier, M.D.  [facility] at [facility]  [place] [address] [place], [place] [address] [phone] phone [phone] fax  06/10/17 11:46 PM

## 2017-06-08 NOTE — Patient Instructions (Addendum)
   IF you received an x-ray today, you will receive an invoice from [facility]. Please contact [facility] at [phone] with questions or concerns regarding your invoice.   IF you received labwork today, you will receive an invoice from [facility]. Please contact [facility] at [phone] with questions or concerns regarding your invoice.   Our billing staff will not be able to assist you with questions regarding bills from these companies.  You will be contacted with the lab results as soon as they are available. The fastest way to get your results is to activate your My Chart account. Instructions are located on the last page of this paperwork. If you have not heard from us regarding the results in 2 weeks, please contact this office.    Urinary Incontinence Urinary incontinence is the involuntary loss of urine from your bladder. What are the causes? There are many causes of urinary incontinence. They include:  Medicines.  Infections.  Prostatic enlargement, leading to overflow of urine from your bladder.  Surgery.  Neurological diseases.  Emotional factors.  What are the signs or symptoms? Urinary Incontinence can be divided into four types: 1. Urge incontinence. Urge incontinence is the involuntary loss of urine before you have the opportunity to go to the bathroom. There is a sudden urge to void but not enough time to reach a bathroom. 2. Stress incontinence. Stress incontinence is the sudden loss of urine with any activity that forces urine to pass. It is commonly caused by anatomical changes to the pelvis and sphincter areas of your body. 3. Overflow incontinence. Overflow incontinence is the loss of urine from an obstructed opening to your bladder. This results in a backup of urine and a resultant buildup of pressure within the bladder. When the pressure within the bladder exceeds the closing pressure of the sphincter, the urine overflows, which causes  incontinence, similar to water overflowing a dam. 4. Total incontinence. Total incontinence is the loss of urine as a result of the inability to store urine within your bladder.  How is this diagnosed? Evaluating the cause of incontinence may require:  A thorough and complete medical and obstetric history.  A complete physical exam.  Laboratory tests such as a urine culture and sensitivities.  When additional tests are indicated, they can include:  An ultrasound exam.  Kidney and bladder X-rays.  Cystoscopy. This is an exam of the bladder using a narrow scope.  Urodynamic testing to test the nerve function to the bladder and sphincter areas.  How is this treated? Treatment for urinary incontinence depends on the cause:  For urge incontinence caused by a bacterial infection, antibiotics will be prescribed. If the urge incontinence is related to medicines you take, your health care provider may have you change the medicine.  For stress incontinence, surgery to re-establish anatomical support to the bladder or sphincter, or both, will often correct the condition.  For overflow incontinence caused by an enlarged prostate, an operation to open the channel through the enlarged prostate will allow the flow of urine out of the bladder. In women with fibroids, a hysterectomy may be recommended.  For total incontinence, surgery on your urinary sphincter may help. An artificial urinary sphincter (an inflatable cuff placed around the urethra) may be required. In women who have developed a hole-like passage between their bladder and vagina (vesicovaginal fistula), surgery to close the fistula often is required.  Follow these instructions at home:  Normal daily hygiene and the use of pads or   adult diapers that are changed regularly will help prevent odors and skin damage.  Avoid caffeine. It can overstimulate your bladder.  Use the bathroom regularly. Try about every 2-3 hours to go to the  bathroom, even if you do not feel the need to do so. Take time to empty your bladder completely. After urinating, wait a minute. Then try to urinate again.  For causes involving nerve dysfunction, keep a log of the medicines you take and a journal of the times you go to the bathroom. Contact a health care provider if:  You experience worsening of pain instead of improvement in pain after your procedure.  Your incontinence becomes worse instead of better. Get help right away if:  You experience fever or shaking chills.  You are unable to pass your urine.  You have redness spreading into your groin or down into your thighs. This information is not intended to replace advice given to you by your health care provider. Make sure you discuss any questions you have with your health care provider. Document Released: 08/25/2004 Document Revised: 02/26/2016 Document Reviewed: 12/25/2012 Elsevier Interactive Patient Education  2018 [employer]  

## 2017-06-09 LAB — TSH: TSH: 1.31 u[IU]/mL (ref 0.450–4.500)

## 2017-06-20 DIAGNOSIS — M1711 Unilateral primary osteoarthritis, right knee: Secondary | ICD-10-CM | POA: Diagnosis not present

## 2017-06-20 DIAGNOSIS — M25561 Pain in right knee: Secondary | ICD-10-CM | POA: Diagnosis not present

## 2017-06-20 DIAGNOSIS — S72141D Displaced intertrochanteric fracture of right femur, subsequent encounter for closed fracture with routine healing: Secondary | ICD-10-CM | POA: Diagnosis not present

## 2017-06-20 DIAGNOSIS — G8929 Other chronic pain: Secondary | ICD-10-CM | POA: Diagnosis not present

## 2017-08-03 ENCOUNTER — Ambulatory Visit (INDEPENDENT_AMBULATORY_CARE_PROVIDER_SITE_OTHER): Payer: Medicare Other | Admitting: Family Medicine

## 2017-08-03 ENCOUNTER — Other Ambulatory Visit: Payer: Self-pay

## 2017-08-03 ENCOUNTER — Encounter: Payer: Self-pay | Admitting: Family Medicine

## 2017-08-03 ENCOUNTER — Ambulatory Visit (INDEPENDENT_AMBULATORY_CARE_PROVIDER_SITE_OTHER): Payer: Medicare Other

## 2017-08-03 VITALS — BP 118/76 | HR 87 | Temp 98.7°F | Resp 16 | Ht 59.0 in | Wt 128.8 lb

## 2017-08-03 DIAGNOSIS — A0471 Enterocolitis due to Clostridium difficile, recurrent: Secondary | ICD-10-CM | POA: Diagnosis not present

## 2017-08-03 DIAGNOSIS — R159 Full incontinence of feces: Secondary | ICD-10-CM

## 2017-08-03 DIAGNOSIS — R32 Unspecified urinary incontinence: Secondary | ICD-10-CM

## 2017-08-03 DIAGNOSIS — F0390 Unspecified dementia without behavioral disturbance: Secondary | ICD-10-CM | POA: Diagnosis not present

## 2017-08-03 LAB — POC MICROSCOPIC URINALYSIS (UMFC): Mucus: ABSENT

## 2017-08-03 LAB — POCT URINALYSIS DIP (MANUAL ENTRY)
Glucose, UA: NEGATIVE mg/dL
Nitrite, UA: NEGATIVE
PH UA: 5.5 (ref 5.0–8.0)
Protein Ur, POC: NEGATIVE mg/dL
Spec Grav, UA: 1.015 (ref 1.010–1.025)
UROBILINOGEN UA: 0.2 U/dL

## 2017-08-03 LAB — POC HEMOCCULT BLD/STL (OFFICE/1-CARD/DIAGNOSTIC): Fecal Occult Blood, POC: NEGATIVE

## 2017-08-03 NOTE — Progress Notes (Signed)
Subjective:  By signing my name below, I, Essence Howell, attest that this documentation has been prepared under the direction and in the presence of Norberto Sorenson, MD Electronically Signed: Charline Bills, ED Scribe 08/03/2017 at 12:39 PM.   Patient ID: Brianna Reynolds, female    DOB: 1924-01-15, 84 y.o.   MRN: [id number]  Chief Complaint  Patient presents with  . Encopresis    started around Christmas time    HPI Brianna Reynolds is a 84 y.o. female who presents to Primary Care at [facility] complaining of encopresis x 1 week. Pt reports that these episodes have been occurring ~1-2 times/day with some formed stools as well as liquid. She did not notice a change in her BMs prior to a week ago. Husband denies any changes other than Myrbetriq which pt has stopped without change in symptoms. Denies appetite change, abdominal pain, abdominal distention, nausea, vomiting, blood in stools, melena, changes in urine.   Past Medical History:  Diagnosis Date  . Abnormal EKG   . Anxiety   . C. difficile colitis   . Cataract   . Fatigue   . HTN (hypertension)    New  . Hyperlipidemia   . Hypothyroid   . Memory loss   . Pneumonia    Current Outpatient Medications on File Prior to Visit  Medication Sig Dispense Refill  . acetaminophen (TYLENOL) 325 MG tablet Take 650 mg by mouth every 6 (six) hours as needed for headache (headache).    Marland Kitchen aspirin EC 81 MG tablet Take 81 mg by mouth daily.    . bifidobacterium infantis (ALIGN) capsule Take 1 capsule by mouth daily. 30 capsule 12  . Calcium Carbonate (CALTRATE 600 PO) Take 1 tablet by mouth daily.    . cetirizine (ZYRTEC) 10 MG tablet Take 1 tablet (10 mg total) at bedtime by mouth. 30 tablet 2  . Cholecalciferol (VITAMIN D) 2000 UNITS CAPS Take 1 capsule by mouth daily.     Marland Kitchen donepezil (ARICEPT) 10 MG tablet Take 10 mg by mouth.    . levothyroxine (SYNTHROID, LEVOTHROID) 88 MCG tablet Take 1 tablet (88 mcg total) by mouth daily before breakfast. 90  tablet 1  . Melatonin 5 MG TABS Take 5 mg by mouth at bedtime.     . mirabegron ER (MYRBETRIQ) 50 MG TB24 tablet Take 1 tablet (50 mg total) daily by mouth. 30 tablet 5  . Multiple Vitamins-Minerals (PRESERVISION AREDS) CAPS Take 1 capsule by mouth daily.    . QUEtiapine (SEROQUEL) 25 MG tablet TAKE ONE TABLET AT BEDTIME AS NEEDED FOR INSOMNIA-MAY REPEAT IN 1-2 HOURS IF NEEDED. 60 tablet 3  . sertraline (ZOLOFT) 100 MG tablet Take 1 tablet (100 mg total) daily by mouth. 90 tablet 3   No current facility-administered medications on file prior to visit.    Allergies  Allergen Reactions  . Penicillins Rash   Past Surgical History:  Procedure Laterality Date  . BREAST SURGERY Bilateral   . COLONOSCOPY    . COLONOSCOPY N/A 10/27/2014   Procedure: COLONOSCOPY;  Surgeon: Iva Boop, MD;  Location: [facility] ENDOSCOPY;  Service: Endoscopy;  Laterality: N/A;  . FECAL TRANSPLANT N/A 10/27/2014  . INTRAMEDULLARY (IM) NAIL INTERTROCHANTERIC Right 12/23/2016   Procedure: RIGHT INTRAMEDULLARY (IM) NAIL INTERTROCHANTRIC;  Surgeon: Samson Frederic, MD;  Location: [facility] OR;  Service: Orthopedics;  Laterality: Right;  . PARATHYROIDECTOMY    . THYROIDECTOMY, PARTIAL    . TONSILLECTOMY     Family History  Problem Relation Age  of Onset  . Arthritis Mother    Social History   Socioeconomic History  . Marital status: Married    Spouse name: None  . Number of children: 5  . Years of education: None  . Highest education level: None  Social Needs  . Financial resource strain: None  . Food insecurity - worry: None  . Food insecurity - inability: None  . Transportation needs - medical: None  . Transportation needs - non-medical: None  Occupational History  . Occupation: house wife  Tobacco Use  . Smoking status: Former Smoker    Types: Cigarettes    Last attempt to quit: 02/10/1991    Years since quitting: 26.5  . Smokeless tobacco: Never Used  Substance and Sexual Activity  . Alcohol use: No     Alcohol/week: 0.0 oz    Comment: rarely  . Drug use: No  . Sexual activity: None  Other Topics Concern  . None  Social History Narrative  . None   Depression screen [facility] 2/9 06/08/2017 03/08/2017 03/02/2017 12/15/2016 08/04/2016  Decreased Interest 0 0 0 0 0  Down, Depressed, Hopeless 0 0 0 0 0  PHQ - 2 Score 0 0 0 0 0    Review of Systems  Constitutional: Negative for appetite change.  Gastrointestinal: Negative for abdominal distention, abdominal pain, blood in stool, nausea and vomiting.  Genitourinary: Negative for decreased urine volume.      Objective:   Physical Exam  Constitutional: She is oriented to person, place, and time. She appears well-developed and well-nourished. No distress.  HENT:  Head: Normocephalic and atraumatic.  Eyes: Conjunctivae and EOM are normal.  Neck: Neck supple. No tracheal deviation present.  Cardiovascular: Normal rate.  Pulmonary/Chest: Effort normal. No respiratory distress. She has rales (bibasilar).  Abdominal: Bowel sounds are increased.  Increased gas.  Genitourinary:  Genitourinary Comments: Mildly decreased rectal tone. Few small tiny pieces of soft stool in vault.  Musculoskeletal: Normal range of motion.  Neurological: She is alert and oriented to person, place, and time.  Skin: Skin is warm and dry.  Psychiatric: She has a normal mood and affect. Her behavior is normal.  Nursing note and vitals reviewed.  BP 118/76   Pulse 87   Temp 98.7 F (37.1 C)   Resp 16   Ht 4\' 11"  (1.499 m)   Wt 128 lb 12.8 oz (58.4 kg)   SpO2 96%   BMI 26.01 kg/m     Dg Abd 1 View  Result Date: 08/03/2017 CLINICAL DATA:  New onset encopresis.  One week duration.  Dementia. EXAM: ABDOMEN - 1 VIEW COMPARISON:  None. FINDINGS: Bowel gas pattern is normal without evidence of ileus, obstruction or free air. Normal amount of fecal matter. Early vascular calcification noted in the pelvis. Chronic degenerative changes of the spine with spinal curvature.  IMPRESSION: No acute finding.  Gas pattern unremarkable. Electronically Signed   By: Paulina Fusi M.D.   On: 08/03/2017 12:19   Assessment & Plan:   1. Encopresis - new for past 1-2 weeks. No sign of underlying constipation. Try bulking age. R/o recurrence of c. Diff - h/o requiring fecal transplant.  2. Enuresis   3. Recurrent Clostridium difficile diarrhea     Orders Placed This Encounter  Procedures  . Clostridium Difficile by PCR    Order Specific Question:   Is your patient experiencing loose or watery stools (3 or more in 24 hours)?    Answer:   No (testing not recommeded)  .  DG Abd 1 View    Standing Status:   Future    Number of Occurrences:   1    Standing Expiration Date:   08/03/2018    Order Specific Question:   Reason for Exam (SYMPTOM  OR DIAGNOSIS REQUIRED)    Answer:   new onset encoparesis x 1 wk in patient wiht dementia - r/o constipation for overflow incontinence    Order Specific Question:   Preferred imaging location?    Answer:   External  . POCT urinalysis dipstick  . POCT Microscopic Urinalysis ([facility])  . POC Hemoccult Bld/Stl (1-Cd Office Dx)    No orders of the defined types were placed in this encounter.   I personally performed the services described in this documentation, which was scribed in my presence. The recorded information has been reviewed and considered, and addended by me as needed.   Norberto SorensonEva Shaw, M.D.  Primary [facility] at [facility]  [place] [address] [place], [place] [address] [phone] phone [phone]) [phone] fax  08/06/17 11:14 AM

## 2017-08-03 NOTE — Patient Instructions (Addendum)
Start by adding in psyllium or methylcellulose (types of fiber supplement like metamucil or benefiber) 1-2 tablespoons/day. Avoid caffeine.   IF you received an x-ray today, you will receive an invoice from [facility]. Please contact [facility] at [phone] with questions or concerns regarding your invoice.   IF you received labwork today, you will receive an invoice from [facility]. Please contact [facility] at [phone] with questions or concerns regarding your invoice.   Our billing staff will not be able to assist you with questions regarding bills from these companies.  You will be contacted with the lab results as soon as they are available. The fastest way to get your results is to activate your My Chart account. Instructions are located on the last page of this paperwork. If you have not heard from [place] regarding the results in 2 weeks, please contact this office.     Fecal Incontinence Fecal incontinence, also called accidental bowel leakage, is not being able to control your bowels. This condition happens because the nerves or muscles around the anus do not work the way they should. This affects their ability to hold stool. What are the causes? This condition may be caused by:  Damage to the muscles at the end of the rectum (sphincter).  Damage to the nerves that control bowel movements.  Diarrhea.  Chronic constipation.  Pelvic floor dysfunction. This means the muscles in the pelvis do not work well.  Loss of bowel storage capacity.  What increases the risk? This condition is more likely to develop in people who:  Are born with bowels or a pelvis that has not formed correctly.  Have had rectal surgery.  Have had radiation treatment for certain cancers.  Have irritable bowel syndrome (IBS).  Have an inflammatory bowel disease (IBD), such as Crohn disease.  Have been pregnant, had a vaginal delivery, or had surgery that damaged the pelvic floor  muscles.  Have a complicated childbirth, spinal cord injury, or other trauma that causes nerve damage.  Have a condition that can affect nerve function, such as diabetes, Parkinson disease, or multiple sclerosis.  Have a condition where the rectum drops down into the anus or vagina (prolapse).  Are older.  What are the signs or symptoms? The main symptom of this condition is not being able to control your bowels. You also might not be able get to the bathroom before a bowel movement. How is this diagnosed? This condition is diagnosed with a medical history and physical exam. You may also have tests, including:  Blood tests.  Urine tests.  A rectal exam.  Ultrasound.  MRI.  Colonoscopy. This is an exam that looks at your large intestine (colon).  Anal manometry. This is a test that measures the strength of the anal sphincter.  Anal electromyogram (EMG). This is a test that uses small electrodes to check for nerve damage.  How is this treated? Treatment varies depending on the cause and severity of your condition. Treatment may also focus on addressing any underlying causes of this condition. Treatment may include:  Medicines. This may include medicines to: ? Prevent diarrhea. ? Help with constipation (laxatives). ? Treat any underlying conditions.  Physical therapy.  Fiber supplements. These can help manage your bowel movements.  Nerve stimulation.  Injectable gel to promote tissue growth and better muscle control.  Surgery. You may need: ? Sphincter repair surgery. ? Diversion surgery. This procedure lets feces pass out of your body through a hole in your abdomen.  Follow  these instructions at home: Diet  Follow instructions from your health care provider about any eating or drinking restrictions. Work with a dietitian to come up with a healthy diet and to help you avoid the foods that can make your condition worse. Keep a diet diary to find out which foods or  drinks could be making your fecal incontinence worse.  Drink enough fluid to keep your urine clear or pale yellow. Lifestyle  If you smoke, talk to your health care provider about quitting. This may help your condition.  If you are overweight, talk to your health care provider about how to safely lose weight. This may help your condition.  Increase your physical activity as told by your health care provider. This may help your condition. Always talk to your health care provider before starting a new exercise program.  Carry a change of clothes and supplies to clean up quickly if you have an episode of fetal incontinence.  Consider joining a fecal incontinence support group. You can find a support group online or in your local community. General instructions  Take over-the-counter and prescription medicines only as told by your health care provider. This includes any supplements.  Apply a moisture barrier, such as petroleum jelly, to your rectum. This protects the skin from irritation caused by ongoing leaking or diarrhea.  Tell your health care provider if you are upset or depressed about your condition. Where to find more information: American Academy of Family Physicians: www.https://powers.com/aafp.org [employer] for Functional Gastrointestinal Disorders: www.iffgd.org Contact a health care provider if:  You have a fever.  You have redness, swelling, or pain around your rectum.  Your pain is getting worse or you lose feeling in your rectal area.  You have blood in your stool.  You feel sad or hopeless.  You avoid social or work situations. Get help right away if:  You stop having bowel movements.  You cannot eat or drink without vomiting.  You have rectal bleeding that does not stop.  You have severe pain that is getting worse.  You have symptoms of dehydration, including: ? Sleepiness or fatigue. ? Producing little or no urine, tears, or sweat. ? Dizziness. ? Dry  mouth. ? Unusual irritability. ? Headache. ? Inability to think clearly. This information is not intended to replace advice given to you by your health care provider. Make sure you discuss any questions you have with your health care provider. Document Released: 06/29/2004 Document Revised: 12/24/2015 Document Reviewed: 12/24/2014 Elsevier Interactive Patient Education  2018 [employer]  Skin Care and Bowel Hygiene   Anyone who has frequent bowel movements, diarrhea, or bowel leakage (fecal incontinence) may experience soreness or skin irritation around the anal region.  Occasionally, the skin can become so inflamed that it breaks into open sores.  Prevent skin breakdown by following good skin care habits.  Cleaning and Washing Techniques After having a bowel movement, men and women should tighten their anal sphincter before wiping.  Women should always wipe from front to back to prevent fecal matter from getting into the urethra and vagina.   Tips for Cleaning and Washing . wipe from front to back towards the anus . always wipe gently with soft toilet paper, or ideally with moist toilet paper . wipe only once with each piece of toilet paper so as not to re-contaminate the area . wash in warm water alone or with a minimal amount of mild, fragrance-free soap . use non-biological washing powder . gently pat skin  completely dry, avoiding rubbing . if drying the skin after washing is difficult or uncomfortable, try using a hairdryer on a low setting (use very carefully) . allow air to get to the irritated area for some part of every day . use protective skin creams containing zinc as recommended by your doctor  What To Avoid . baths with extra-hot water . soaking for long periods of time in the bathtub . disinfectants and antiseptics  . bath oils, bath salts, and talcum powder . using plastic pants, pads, and sheets, which cause sweating . scratching at the irritated area  Additional  Tips . some people find that citrus and acidic foods cause or worsen skin irritation . eat a healthy, balanced diet that is high in fiber  . drink plenty of fluids . wear cotton underwear to allow the skin to breath . talk to your healthcare provider about further treatment options; persistent problems need medical attention   2007, [employer] Doc.25

## 2017-08-05 DIAGNOSIS — R159 Full incontinence of feces: Secondary | ICD-10-CM | POA: Diagnosis not present

## 2017-08-05 DIAGNOSIS — A0471 Enterocolitis due to Clostridium difficile, recurrent: Secondary | ICD-10-CM | POA: Diagnosis not present

## 2017-08-08 LAB — CLOSTRIDIUM DIFFICILE BY PCR: Toxigenic C. Difficile by PCR: NEGATIVE

## 2017-08-12 ENCOUNTER — Telehealth: Payer: Self-pay | Admitting: Family Medicine

## 2017-08-12 NOTE — Telephone Encounter (Signed)
Answering service called stating pt's husband Leonette MostCharles was calling to get specimen results for pt. The answering service said they paged Dr. Clelia CroftShaw but the voicemail was full. The pt's husband would like to be contacted at [phone].

## 2017-08-15 NOTE — Telephone Encounter (Signed)
Had been done

## 2017-08-15 NOTE — Telephone Encounter (Signed)
Provider, please review lab results.

## 2017-08-17 ENCOUNTER — Ambulatory Visit: Payer: Medicare Other

## 2017-08-17 ENCOUNTER — Encounter: Payer: Self-pay | Admitting: Family Medicine

## 2017-08-17 ENCOUNTER — Other Ambulatory Visit: Payer: Self-pay

## 2017-08-17 ENCOUNTER — Encounter: Payer: Medicare Other | Admitting: Family Medicine

## 2017-08-17 ENCOUNTER — Ambulatory Visit (INDEPENDENT_AMBULATORY_CARE_PROVIDER_SITE_OTHER): Payer: Medicare Other | Admitting: Family Medicine

## 2017-08-17 VITALS — BP 120/60 | HR 82 | Temp 98.6°F | Resp 16 | Ht 59.0 in | Wt 127.0 lb

## 2017-08-17 DIAGNOSIS — R32 Unspecified urinary incontinence: Secondary | ICD-10-CM

## 2017-08-17 DIAGNOSIS — A0471 Enterocolitis due to Clostridium difficile, recurrent: Secondary | ICD-10-CM | POA: Diagnosis not present

## 2017-08-17 DIAGNOSIS — G4700 Insomnia, unspecified: Secondary | ICD-10-CM

## 2017-08-17 DIAGNOSIS — I10 Essential (primary) hypertension: Secondary | ICD-10-CM

## 2017-08-17 DIAGNOSIS — R197 Diarrhea, unspecified: Secondary | ICD-10-CM

## 2017-08-17 DIAGNOSIS — F0390 Unspecified dementia without behavioral disturbance: Secondary | ICD-10-CM | POA: Diagnosis not present

## 2017-08-17 DIAGNOSIS — E039 Hypothyroidism, unspecified: Secondary | ICD-10-CM | POA: Diagnosis not present

## 2017-08-17 DIAGNOSIS — Z0000 Encounter for general adult medical examination without abnormal findings: Secondary | ICD-10-CM | POA: Diagnosis not present

## 2017-08-17 MED ORDER — OXYBUTYNIN CHLORIDE ER 15 MG PO TB24: 15.0000 mg | ORAL_TABLET | Freq: Every day | ORAL | 1 refills | Status: DC

## 2017-08-17 MED ORDER — QUETIAPINE FUMARATE 25 MG PO TABS: ORAL_TABLET | ORAL | 1 refills | Status: DC

## 2017-08-17 MED ORDER — DONEPEZIL HCL 10 MG PO TABS: 10.0000 mg | ORAL_TABLET | Freq: Every day | ORAL | 3 refills | Status: DC

## 2017-08-17 MED ORDER — METRONIDAZOLE 250 MG PO TABS: 250.0000 mg | ORAL_TABLET | Freq: Two times a day (BID) | ORAL | 0 refills | Status: DC

## 2017-08-17 NOTE — Progress Notes (Deleted)
Patient ID: Brianna Kirksileen D Aamodt, female   DOB: [birth year], 84 y.o.   MRN: [id number] Subjective:    Brianna Reynolds is a 84 y.o. female who presents for Medicare Annual/Subsequent preventive examination.  Preventive Screening-Counseling & Management  Tobacco Social History   Tobacco Use  Smoking Status Former Smoker  . Types: Cigarettes  . Last attempt to quit: 02/10/1991  . Years since quitting: 26.5  Smokeless Tobacco Never Used     Problems Prior to Visit 1.   Current Problems (verified) Patient Active Problem List   Diagnosis Date Noted  . Closed comminuted intertrochanteric fracture of proximal end of right femur (HCC) 12/23/2016  . Closed right hip fracture (HCC) 12/22/2016  . Fall at home, initial encounter 12/22/2016  . Dementia 12/22/2016  . Amnesia 01/08/2015  . Cerebrovascular small vessel disease 01/08/2015  . Idiopathic peripheral neuropathy 01/08/2015  . Atherosclerotic cerebrovascular disease 01/08/2015  . Cough   . Recurrent Clostridium difficile diarrhea 06/13/2014  . Leukocytosis 06/13/2014  . Dehydration 06/13/2014  . Hypothyroid   . Insomnia 02/28/2013  . Abnormal serum protein electrophoresis 02/11/2013  . Abnormal EKG   . HTN (hypertension)   . Fatigue     Medications Prior to Visit Current Outpatient Medications on File Prior to Visit  Medication Sig Dispense Refill  . acetaminophen (TYLENOL) 325 MG tablet Take 650 mg by mouth every 6 (six) hours as needed for headache (headache).    Marland Kitchen. aspirin EC 81 MG tablet Take 81 mg by mouth daily.    . Calcium Carbonate (CALTRATE 600 PO) Take 1 tablet by mouth daily.    . Cholecalciferol (VITAMIN D) 2000 UNITS CAPS Take 1 capsule by mouth daily.     Marland Kitchen. donepezil (ARICEPT) 10 MG tablet Take 10 mg by mouth.    . levothyroxine (SYNTHROID, LEVOTHROID) 88 MCG tablet Take 1 tablet (88 mcg total) by mouth daily before breakfast. 90 tablet 1  . Melatonin 5 MG TABS Take 5 mg by mouth at bedtime.     . mirabegron  ER (MYRBETRIQ) 50 MG TB24 tablet Take 1 tablet (50 mg total) daily by mouth. 30 tablet 5  . QUEtiapine (SEROQUEL) 25 MG tablet TAKE ONE TABLET AT BEDTIME AS NEEDED FOR INSOMNIA-MAY REPEAT IN 1-2 HOURS IF NEEDED. 60 tablet 3  . sertraline (ZOLOFT) 100 MG tablet Take 1 tablet (100 mg total) daily by mouth. 90 tablet 3  . bifidobacterium infantis (ALIGN) capsule Take 1 capsule by mouth daily. 30 capsule 12  . cetirizine (ZYRTEC) 10 MG tablet Take 1 tablet (10 mg total) at bedtime by mouth. 30 tablet 2  . Multiple Vitamins-Minerals (PRESERVISION AREDS) CAPS Take 1 capsule by mouth daily.     No current facility-administered medications on file prior to visit.     Current Medications (verified) Current Outpatient Medications  Medication Sig Dispense Refill  . acetaminophen (TYLENOL) 325 MG tablet Take 650 mg by mouth every 6 (six) hours as needed for headache (headache).    Marland Kitchen. aspirin EC 81 MG tablet Take 81 mg by mouth daily.    . Calcium Carbonate (CALTRATE 600 PO) Take 1 tablet by mouth daily.    . Cholecalciferol (VITAMIN D) 2000 UNITS CAPS Take 1 capsule by mouth daily.     Marland Kitchen. donepezil (ARICEPT) 10 MG tablet Take 10 mg by mouth.    . levothyroxine (SYNTHROID, LEVOTHROID) 88 MCG tablet Take 1 tablet (88 mcg total) by mouth daily before breakfast. 90 tablet 1  . Melatonin 5 MG TABS Take  5 mg by mouth at bedtime.     . mirabegron ER (MYRBETRIQ) 50 MG TB24 tablet Take 1 tablet (50 mg total) daily by mouth. 30 tablet 5  . QUEtiapine (SEROQUEL) 25 MG tablet TAKE ONE TABLET AT BEDTIME AS NEEDED FOR INSOMNIA-MAY REPEAT IN 1-2 HOURS IF NEEDED. 60 tablet 3  . sertraline (ZOLOFT) 100 MG tablet Take 1 tablet (100 mg total) daily by mouth. 90 tablet 3  . bifidobacterium infantis (ALIGN) capsule Take 1 capsule by mouth daily. 30 capsule 12  . cetirizine (ZYRTEC) 10 MG tablet Take 1 tablet (10 mg total) at bedtime by mouth. 30 tablet 2  . Multiple Vitamins-Minerals (PRESERVISION AREDS) CAPS Take 1 capsule  by mouth daily.     No current facility-administered medications for this visit.      Allergies (verified) Penicillins   PAST HISTORY  Family History Family History  Problem Relation Age of Onset  . Arthritis Mother     Social History Social History   Tobacco Use  . Smoking status: Former Smoker    Types: Cigarettes    Last attempt to quit: 02/10/1991    Years since quitting: 26.5  . Smokeless tobacco: Never Used  Substance Use Topics  . Alcohol use: No    Alcohol/week: 0.0 oz    Comment: rarely     Are there smokers in your home (other than you)? {yes/no:20286}  Risk Factors Current exercise habits: {exercise:19826}  Dietary issues discussed: ***   Cardiac risk factors: {risk factors:510}.  Depression Screen (Note: if answer to either of the following is "Yes", a more complete depression screening is indicated)   Over the past two weeks, have you felt down, depressed or hopeless? {yes/no:20286}  Over the past two weeks, have you felt little interest or pleasure in doing things? {yes/no:20286}  Have you lost interest or pleasure in daily life? {yes/no:20286}  Do you often feel hopeless? {yes/no:20286}  Do you cry easily over simple problems? {yes/no:20286}  Activities of Daily Living In your present state of health, do you have any difficulty performing the following activities?:  Driving? {yes/no:20286} Managing money?  {Responses; yes/no (default no):140031::"No"} Feeding yourself? {yes/no:20286} Getting from bed to chair? {yes/no:20286}{exam, Complete:18323} Climbing a flight of stairs? {yes/no:20286} Preparing food and eating?: {yes/no (default no):140031::"No"} Bathing or showering? {Responses; yes/no (default no):140031::"No"} Getting dressed: {yes/no (default no):140031::"No"} Getting to the toilet? {Responses; yes/no (default no):140031::"No"} Using the toilet:{yes/no (default no):140031::"No"} Moving around from place to place: {yes/no (default  no):140031::"No"} In the past year have you fallen or had a near fall?:{yes/no (default no):140031::"No"}   Are you sexually active?  {yes/no:20286}  Do you have more than one partner?  {Responses; yes/no (default no):140031::"No"}  Hearing Difficulties: {yes/no (default no):140031::"No"} Do you often ask people to speak up or repeat themselves? {Responses; yes/no (default no):140031::"No"} Do you experience ringing or noises in your ears? {Responses; yes/no (default no):140031::"No"} Do you have difficulty understanding soft or whispered voices? {Responses; yes/no (default no):140031::"No"}   Do you feel that you have a problem with memory? {yes/no:20286}  Do you often misplace items? {yes/no:20286}  Do you feel safe at home?  {yes/no:20286}  Cognitive Testing   Alert? {yes/no:20286}  Normal Appearance?{yes/no:20286}  Oriented to person? {yes/no:20286}  Place? {yes/no:20286}   Time? {yes/no:20286}  Recall of three objects?  {yes/no:20286}  Can perform simple calculations? {yes/no:20286}  Displays appropriate judgment?{yes/no:20286}  Can read the correct time from a watch face?{yes/no:20286}   Advanced Directives have been discussed with the patient? {yes/no:20286}  List the Names  of Other Physician/Practitioners you currently use: 1.    Indicate any recent Medical Services you may have received from other than [facility] providers in the past year (date may be approximate).  Immunization History  Administered Date(s) Administered  . Influenza,inj,Quad PF,6+ Mos 05/13/2013, 05/13/2014, 06/12/2015, 05/25/2016, 06/08/2017  . Pneumococcal Conjugate-13 03/11/2014  . Tdap 06/12/2015    Screening Tests Health Maintenance  Topic Date Due  . DEXA SCAN  05/24/1991  . PNA vac Low Risk Adult (2 of 2 - PPSV23) 03/12/2015  . MAMMOGRAM  03/30/2017  . TETANUS/TDAP  06/11/2025  . INFLUENZA VACCINE  Completed    All answers were reviewed with the patient and necessary referrals were  made:  Sherren Mocha, MD   08/17/2017   History reviewed: {history reviewed:20406::"allergies","current medications","past family history","past medical history","past social history","past surgical history","problem list"}  Review of Systems {ros; complete:30496}    Objective:     Vision by Snellen chart: right eye:{vision:19455::"20/20"}, left eye:{vision:19455::"20/20"}  Body mass index is 25.65 kg/m. BP 120/60 (BP Location: Left Arm, Patient Position: Sitting, Cuff Size: Large)   Pulse 82   Temp 98.6 F (37 C) (Oral)   Resp 16   Ht 4\' 11"  (1.499 m)   Wt 127 lb (57.6 kg)   SpO2 96%   BMI 25.65 kg/m   {Exam, female:18323}     Assessment:     ***     Plan:     During the course of the visit the patient was educated and counseled about appropriate screening and preventive services including:    {plan:19836}  Diet review for nutrition referral? Yes ____  Not Indicated ____   Patient Instructions (the written plan) was given to the patient.  Medicare Attestation I have personally reviewed: The patient's medical and social history Their use of alcohol, tobacco or illicit drugs Their current medications and supplements The patient's functional ability including ADLs,fall risks, home safety risks, cognitive, and hearing and visual impairment Diet and physical activities Evidence for depression or mood disorders  The patient's weight, height, BMI, and visual acuity have been recorded in the chart.  I have made referrals, counseling, and provided education to the patient based on review of the above and I have provided the patient with a written personalized care plan for preventive services.     Sherren Mocha, MD   08/17/2017

## 2017-08-17 NOTE — Progress Notes (Deleted)
Subjective:  By signing my name below, I, Stann Oresung-Kai Tsai, attest that this documentation has been prepared under the direction and in the presence of Norberto SorensonEva Shaw, MD. Electronically Signed: Stann Oresung-Kai Tsai, Scribe. 08/17/2017 , 11:59 AM .  Patient was seen in Room 2 .   Patient ID: Brianna Reynolds, female    DOB: Dec 24, 1923, 84 y.o.   MRN: [id number] Chief Complaint  Patient presents with  . Follow-up    2 weeks   HPI Brianna Reynolds is a 84 y.o. female who presents to [facility] at [facility] for follow up.           Past Medical History:  Diagnosis Date  . Abnormal EKG   . Anxiety   . C. difficile colitis   . Cataract   . Fatigue   . HTN (hypertension)    New  . Hyperlipidemia   . Hypothyroid   . Memory loss   . Pneumonia    Past Surgical History:  Procedure Laterality Date  . BREAST SURGERY Bilateral   . COLONOSCOPY    . COLONOSCOPY N/A 10/27/2014   Procedure: COLONOSCOPY;  Surgeon: Iva Booparl E Gessner, MD;  [facility];  Service: Endoscopy;  Laterality: N/A;  . FECAL TRANSPLANT N/A 10/27/2014  . INTRAMEDULLARY (IM) NAIL INTERTROCHANTERIC Right 12/23/2016   Procedure: RIGHT INTRAMEDULLARY (IM) NAIL INTERTROCHANTRIC;  Surgeon: Samson FredericSwinteck, Brian, MD;  Location: [facility] OR;  Service: Orthopedics;  Laterality: Right;  . PARATHYROIDECTOMY    . THYROIDECTOMY, PARTIAL    . TONSILLECTOMY     Prior to Admission medications   Medication Sig Start Date End Date Taking? Authorizing Provider  acetaminophen (TYLENOL) 325 MG tablet Take 650 mg by mouth every 6 (six) hours as needed for headache (headache).    [provider]  aspirin EC 81 MG tablet Take 81 mg by mouth daily.    [provider]  bifidobacterium infantis (ALIGN) capsule Take 1 capsule by mouth daily. 01/20/16   Weber, Dema SeverinSarah L, PA-C  Calcium Carbonate (CALTRATE 600 PO) Take 1 tablet by mouth daily.    [provider]  cetirizine (ZYRTEC) 10 MG tablet Take 1 tablet (10 mg total) at  bedtime by mouth. 06/08/17   Sherren MochaShaw, Eva N, MD  Cholecalciferol (VITAMIN D) 2000 UNITS CAPS Take 1 capsule by mouth daily.     [provider]  donepezil (ARICEPT) 10 MG tablet Take 10 mg by mouth. 12/04/15   [provider]  levothyroxine (SYNTHROID, LEVOTHROID) 88 MCG tablet Take 1 tablet (88 mcg total) by mouth daily before breakfast. 03/27/17   Sherren MochaShaw, Eva N, MD  Melatonin 5 MG TABS Take 5 mg by mouth at bedtime.     [provider]  mirabegron ER (MYRBETRIQ) 50 MG TB24 tablet Take 1 tablet (50 mg total) daily by mouth. 06/08/17   Sherren MochaShaw, Eva N, MD  Multiple Vitamins-Minerals (PRESERVISION AREDS) CAPS Take 1 capsule by mouth daily.    [provider]  QUEtiapine (SEROQUEL) 25 MG tablet TAKE ONE TABLET AT BEDTIME AS NEEDED FOR INSOMNIA-MAY REPEAT IN 1-2 HOURS IF NEEDED. 04/19/17   Sherren MochaShaw, Eva N, MD  sertraline (ZOLOFT) 100 MG tablet Take 1 tablet (100 mg total) daily by mouth. 06/08/17   Sherren MochaShaw, Eva N, MD   Allergies  Allergen Reactions  . Penicillins Rash   Family History  Problem Relation Age of Onset  . Arthritis Mother    Social History   Socioeconomic History  . Marital status: Married    Spouse name:  None  . Number of children: 5  . Years of education: None  . Highest education level: None  Social Needs  . Financial resource strain: None  . Food insecurity - worry: None  . Food insecurity - inability: None  . Transportation needs - medical: None  . Transportation needs - non-medical: None  Occupational History  . Occupation: house wife  Tobacco Use  . Smoking status: Former Smoker    Types: Cigarettes    Last attempt to quit: 02/10/1991    Years since quitting: 26.5  . Smokeless tobacco: Never Used  Substance and Sexual Activity  . Alcohol use: No    Alcohol/week: 0.0 oz    Comment: rarely  . Drug use: No  . Sexual activity: None  Other Topics Concern  . None  Social History Narrative  . None   Depression screen [facility] 2/9 06/08/2017 03/08/2017  03/02/2017 12/15/2016 08/04/2016  Decreased Interest 0 0 0 0 0  Down, Depressed, Hopeless 0 0 0 0 0  PHQ - 2 Score 0 0 0 0 0    Review of Systems     Objective:   Physical Exam  Constitutional: She is oriented to person, place, and time. She appears well-developed and well-nourished. No distress.  HENT:  Head: Normocephalic and atraumatic.  Eyes: EOM are normal. Pupils are equal, round, and reactive to light.  Neck: Neck supple.  Cardiovascular: Normal rate.  Pulmonary/Chest: Effort normal. No respiratory distress.  Musculoskeletal: Normal range of motion.  Neurological: She is alert and oriented to person, place, and time.  Skin: Skin is warm and dry.  Psychiatric: She has a normal mood and affect. Her behavior is normal.  Nursing note and vitals reviewed.   BP 120/60 (BP Location: Left Arm, Patient Position: Sitting, Cuff Size: Large)   Pulse 82   Temp 98.6 F (37 C) (Oral)   Resp 16   Ht 4\' 11"  (1.499 m)   Wt 127 lb (57.6 kg)   SpO2 96%   BMI 25.65 kg/m      Assessment & Plan:

## 2017-08-17 NOTE — Patient Instructions (Addendum)
Increase the benefiber from 2 tablespoons to 3 tablespoons every morning. Start the metronidazole antibiotic 1 twice a day for 5 days - this is a very low dose and will NOT cause a recurrence of the c. Diff (it is one of the antibiotics that can actually TREAT c. Diff.) If things have not improved in 1-2 weeks, you can start a imodium cap once a day in the morning (over-the-counter but happy to send in a prescription for it if you would prefer - just call) as well until symptoms resolve.   After you finish the mybetriq for urination, you can start the oxybutynin instead (Ditropan) which we will use instead of the detrol LA (hopefully will be more effective but the same generic price).  IF you received an x-ray today, you will [facility] an [facility] from [facility]. Please contact [facility] at [phone] with questions or concerns regarding your invoice.   IF you received labwork today, you will receive an invoice from [facility]. Please contact [facility] at [phone] with questions or concerns regarding your invoice.   Our billing staff will not be able to assist you with questions regarding bills from these companies.  You will be contacted with the lab results as soon as they are available. The fastest way to get your results is to activate your My Chart account. Instructions are located on the last page of this paperwork. If you have not heard from us regarding the results in 2 weeks, please contact this office.     Food Choices to Help Relieve Diarrhea, Adult When you have diarrhea, the foods you eat and your eating habits are very important. Choosing the right foods and drinks can help:  Relieve diarrhea.  Replace lost fluids and nutrients.  Prevent dehydration.  What general guidelines should I follow? Relieving diarrhea  Choose foods with less than 2 g or .07 oz. of fiber per serving.  Limit fats to less than 8 tsp (38 g or 1.34 oz.) a day.  Avoid the  following: ? Foods and beverages sweetened with high-fructose corn syrup, honey, or sugar alcohols such as xylitol, sorbitol, and mannitol. ? Foods that contain a lot of fat or sugar. ? Fried, greasy, or spicy foods. ? High-fiber grains, breads, and cereals. ? Raw fruits and vegetables.  Eat foods that are rich in probiotics. These foods include dairy products such as yogurt and fermented milk products. They help increase healthy bacteria in the stomach and intestines (gastrointestinal tract, or GI tract).  If you have lactose intolerance, avoid dairy products. These may make your diarrhea worse.  Take medicine to help stop diarrhea (antidiarrheal medicine) only as told by your health care provider. Replacing nutrients  Eat small meals or snacks every 3-4 hours.  Eat bland foods, such as white rice, toast, or baked potato, until your diarrhea starts to get better. Gradually reintroduce nutrient-rich foods as tolerated or as told by your health care provider. This includes: ? Well-cooked protein foods. ? Peeled, seeded, and soft-cooked fruits and vegetables. ? Low-fat dairy products.  Take vitamin and mineral supplements as told by your health care provider. Preventing dehydration   Start by sipping water or a special solution to prevent dehydration (oral rehydration solution, ORS). Urine that is clear or pale yellow means that you are getting enough fluid.  Try to drink at least 8-10 cups of fluid each day to help replace lost fluids.  You may add other liquids in addition to water, such as clear juice or  decaffeinated sports drinks, as tolerated or as told by your health care provider.  Avoid drinks with caffeine, such as coffee, tea, or soft drinks.  Avoid alcohol. What foods are recommended? The items listed may not be a complete list. Talk with your health care provider about what dietary choices are best for you. Grains White rice. White, [place], or pita breads (fresh or  toasted), including plain rolls, buns, or bagels. White pasta. Saltine, soda, or graham crackers. Pretzels. Low-fiber cereal. Cooked cereals made with water (such as cornmeal, farina, or cream cereals). Plain muffins. Matzo. Melba toast. Zwieback. Vegetables Potatoes (without the skin). Most well-cooked and canned vegetables without skins or seeds. Tender lettuce. Fruits Apple sauce. Fruits canned in juice. Cooked apricots, cherries, grapefruit, peaches, pears, or plums. Fresh bananas and cantaloupe. Meats and other protein foods Baked or boiled chicken. Eggs. Tofu. Fish. Seafood. Smooth nut butters. Ground or well-cooked tender beef, ham, veal, lamb, pork, or poultry. Dairy Plain yogurt, kefir, and unsweetened liquid yogurt. Lactose-free milk, buttermilk, skim milk, or soy milk. Low-fat or nonfat hard cheese. Beverages Water. Low-calorie sports drinks. Fruit juices without pulp. Strained tomato and vegetable juices. Decaffeinated teas. Sugar-free beverages not sweetened with sugar alcohols. Oral rehydration solutions, if approved by your health care provider. Seasoning and other foods Bouillon, broth, or soups made from recommended foods. What foods are not recommended? The items listed may not be a complete list. Talk with your health care provider about what dietary choices are best for you. Grains Whole grain, whole wheat, bran, or rye breads, rolls, pastas, and crackers. Wild or brown rice. Whole grain or bran cereals. Barley. Oats and oatmeal. Corn tortillas or taco shells. Granola. Popcorn. Vegetables  Raw vegetables. Fried vegetables. Cabbage, broccoli, Brussels sprouts, artichokes, baked beans, beet greens, corn, kale, legumes, peas, sweet potatoes, and yams. Potato skins. Cooked spinach and cabbage. Fruits Dried fruit, including raisins and dates. Raw fruits. Stewed or dried prunes. Canned fruits with syrup. Meat and other protein foods Fried or fatty meats. Deli meats. Chunky nut  butters. Nuts and seeds. Beans and lentils. Tomasa Blase. Hot dogs. Sausage. Dairy High-fat cheeses. Whole milk, chocolate milk, and beverages made with milk, such as milk shakes. Half-and-half. Cream. sour cream. Ice cream. Beverages Caffeinated beverages (such as coffee, tea, soda, or energy drinks). Alcoholic beverages. Fruit juices with pulp. Prune juice. Soft drinks sweetened with high-fructose corn syrup or sugar alcohols. High-calorie sports drinks. Fats and oils Butter. Cream sauces. Margarine. Salad oils. Plain salad dressings. Olives. Avocados. Mayonnaise. Sweets and desserts Sweet rolls, doughnuts, and sweet breads. Sugar-free desserts sweetened with sugar alcohols such as xylitol and sorbitol. Seasoning and other foods Honey. Hot sauce. Chili powder. Gravy. Cream-based or milk-based soups. Pancakes and waffles. Summary  When you have diarrhea, the foods you eat and your eating habits are very important.  Make sure you get at least 8-10 cups of fluid each day, or enough to keep your urine clear or pale yellow.  Eat bland foods and gradually reintroduce healthy, nutrient-rich foods as tolerated, or as told by your health care provider.  Avoid high-fiber, fried, greasy, or spicy foods. This information is not intended to replace advice given to you by your health care provider. Make sure you discuss any questions you have with your health care provider. Document Released: 10/08/2003 Document Revised: 07/15/2016 Document Reviewed: 07/15/2016 Elsevier Interactive Patient Education  [employer].

## 2017-08-17 NOTE — Progress Notes (Signed)
Patient ID: Brianna Reynolds, female   DOB: 1923/11/15, 84 y.o.   MRN: [id number] Subjective:  By signing my name below, I, Brianna Reynolds, attest that this documentation has been prepared under the direction and in the presence of Norberto Sorenson, MD. Electronically Signed: Stann Reynolds, Scribe. 08/17/2017 , 12:24 PM .   Patient was seen in Room 2 .  Chief Complaint  Patient presents with  . Follow-up    2 weeks    SHONDRIKA Reynolds is a 84 y.o. female who presents for Medicare Annual/Subsequent preventive examination.   Preventive Screening-Counseling & Management  Tobacco Social History   Tobacco Use  Smoking Status Former Smoker  . Types: Cigarettes  . Last attempt to quit: 02/10/1991  . Years since quitting: 26.5  Smokeless Tobacco Never Used     Problems Prior to Visit 1. Loose bowels: Patient reports still having loose bowels. She takes benefiber 2 tablespoons in the morning with water. She was here 2 weeks ago, and had 1 bowel accident since then. She denies having any abdominal pain or cramping. She denies BM more than twice a day, just more frequently than she used to. She describes her BM being more water; no more odor noticed. She denies any black or tarry stools. She denies lack of appetite or urinary symptoms.   Current Problems (verified) Patient Active Problem List   Diagnosis Date Noted  . Closed comminuted intertrochanteric fracture of proximal end of right femur (HCC) 12/23/2016  . Closed right hip fracture (HCC) 12/22/2016  . Fall at home, initial encounter 12/22/2016  . Dementia 12/22/2016  . Amnesia 01/08/2015  . Cerebrovascular small vessel disease 01/08/2015  . Idiopathic peripheral neuropathy 01/08/2015  . Atherosclerotic cerebrovascular disease 01/08/2015  . Cough   . Recurrent Clostridium difficile diarrhea 06/13/2014  . Leukocytosis 06/13/2014  . Dehydration 06/13/2014  . Hypothyroid   . Insomnia 02/28/2013  . Abnormal serum protein electrophoresis  02/11/2013  . Abnormal EKG   . HTN (hypertension)   . Fatigue     Medications Prior to Visit Current Outpatient Medications on File Prior to Visit  Medication Sig Dispense Refill  . acetaminophen (TYLENOL) 325 MG tablet Take 650 mg by mouth every 6 (six) hours as needed for headache (headache).    Marland Kitchen aspirin EC 81 MG tablet Take 81 mg by mouth daily.    . Calcium Carbonate (CALTRATE 600 PO) Take 1 tablet by mouth daily.    . Cholecalciferol (VITAMIN D) 2000 UNITS CAPS Take 1 capsule by mouth daily.     Marland Kitchen donepezil (ARICEPT) 10 MG tablet Take 10 mg by mouth.    . levothyroxine (SYNTHROID, LEVOTHROID) 88 MCG tablet Take 1 tablet (88 mcg total) by mouth daily before breakfast. 90 tablet 1  . Melatonin 5 MG TABS Take 5 mg by mouth at bedtime.     . mirabegron ER (MYRBETRIQ) 50 MG TB24 tablet Take 1 tablet (50 mg total) daily by mouth. 30 tablet 5  . QUEtiapine (SEROQUEL) 25 MG tablet TAKE ONE TABLET AT BEDTIME AS NEEDED FOR INSOMNIA-MAY REPEAT IN 1-2 HOURS IF NEEDED. 60 tablet 3  . sertraline (ZOLOFT) 100 MG tablet Take 1 tablet (100 mg total) daily by mouth. 90 tablet 3  . bifidobacterium infantis (ALIGN) capsule Take 1 capsule by mouth daily. 30 capsule 12  . cetirizine (ZYRTEC) 10 MG tablet Take 1 tablet (10 mg total) at bedtime by mouth. 30 tablet 2  . Multiple Vitamins-Minerals (PRESERVISION AREDS) CAPS Take 1 capsule by mouth  daily.     No current facility-administered medications on file prior to visit.     Current Medications (verified) Current Outpatient Medications  Medication Sig Dispense Refill  . acetaminophen (TYLENOL) 325 MG tablet Take 650 mg by mouth every 6 (six) hours as needed for headache (headache).    Marland Kitchen. aspirin EC 81 MG tablet Take 81 mg by mouth daily.    . Calcium Carbonate (CALTRATE 600 PO) Take 1 tablet by mouth daily.    . Cholecalciferol (VITAMIN D) 2000 UNITS CAPS Take 1 capsule by mouth daily.     Marland Kitchen. donepezil (ARICEPT) 10 MG tablet Take 10 mg by mouth.    .  levothyroxine (SYNTHROID, LEVOTHROID) 88 MCG tablet Take 1 tablet (88 mcg total) by mouth daily before breakfast. 90 tablet 1  . Melatonin 5 MG TABS Take 5 mg by mouth at bedtime.     . mirabegron ER (MYRBETRIQ) 50 MG TB24 tablet Take 1 tablet (50 mg total) daily by mouth. 30 tablet 5  . QUEtiapine (SEROQUEL) 25 MG tablet TAKE ONE TABLET AT BEDTIME AS NEEDED FOR INSOMNIA-MAY REPEAT IN 1-2 HOURS IF NEEDED. 60 tablet 3  . sertraline (ZOLOFT) 100 MG tablet Take 1 tablet (100 mg total) daily by mouth. 90 tablet 3  . bifidobacterium infantis (ALIGN) capsule Take 1 capsule by mouth daily. 30 capsule 12  . cetirizine (ZYRTEC) 10 MG tablet Take 1 tablet (10 mg total) at bedtime by mouth. 30 tablet 2  . Multiple Vitamins-Minerals (PRESERVISION AREDS) CAPS Take 1 capsule by mouth daily.     No current facility-administered medications for this visit.      Allergies (verified) Penicillins   PAST HISTORY  Family History Family History  Problem Relation Age of Onset  . Arthritis Mother     Social History Social History   Tobacco Use  . Smoking status: Former Smoker    Types: Cigarettes    Last attempt to quit: 02/10/1991    Years since quitting: 26.5  . Smokeless tobacco: Never Used  Substance Use Topics  . Alcohol use: No    Alcohol/week: 0.0 oz    Comment: rarely     Are there smokers in your home (other than you)? No  Risk Factors Current exercise habits: Home Exercises: She's doing leg and arm exercises each twice a week. She feels steady when she's walking with her walker. She discussed switching to cane for mobility assistance, but was recommended staying with walker for her safety.  Dietary issues discussed: none   Cardiac risk factors: advanced age (older than 5155 for men, 4465 for women) and sedentary lifestyle.  Depression Screen Depression screen [facility] 2/9 08/17/2017 06/08/2017 03/08/2017 03/02/2017 12/15/2016  Decreased Interest 0 0 0 0 0  Down, Depressed, Hopeless 0 0 0 0 0  PHQ  - 2 Score 0 0 0 0 0     Activities of Daily Living In your present state of health, do you have any difficulty performing the following activities?:  Driving? Yes - remembering where to go but does drive a little with her husband in the car to help her with directions Managing money?  Yes Feeding yourself? No Getting from bed to chair? No Climbing a flight of stairs? Yes Preparing food and eating?: Yes Bathing or showering? Yes Getting dressed: No Getting to the toilet? No Using the toilet:Yes - is occasionally incontinent of bowels and urine Moving around from place to place: No In the past year have you fallen or had a near  fall?:Yes   Are you sexually active?  No  Do you have more than one partner?  No     Hearing Difficulties: No Do you often ask people to speak up or repeat themselves? No Do you experience ringing or noises in your ears? No Do you have difficulty understanding soft or whispered voices? No   Do you feel that you have a problem with memory? Yes  Do you often misplace items? Yes  Do you feel safe at home?  Yes  Cognitive Testing - Unable to assess due to dementia.   Alert? Yes  Normal Appearance?Yes  Oriented to person? Yes  Place? No   Time? No  Recall of three objects?  No  Can perform simple calculations? No  Displays appropriate judgment?Yes   Advanced Directives have been discussed with the patient? Yes   List the Names of Other Physician/Practitioners you currently use: 1.  Dr. Linna Caprice - orthopedics  Indicate any recent Medical Services you may have received from other than [facility] providers in the past year (date may be approximate).  Immunization History  Administered Date(s) Administered  . Influenza,inj,Quad PF,6+ Mos 05/13/2013, 05/13/2014, 06/12/2015, 05/25/2016, 06/08/2017  . Pneumococcal Conjugate-13 03/11/2014  . Tdap 06/12/2015    Screening Tests Health Maintenance  Topic Date Due  . DEXA SCAN  05/24/1991  . PNA vac Low Risk  Adult (2 of 2 - PPSV23) 03/12/2015  . MAMMOGRAM  03/30/2017  . TETANUS/TDAP  06/11/2025  . INFLUENZA VACCINE  Completed    All answers were reviewed with the patient and necessary referrals were made:  Sherren Mocha, MD   08/17/2017   History reviewed: allergies, current medications, past family history, past medical history, past social history, past surgical history and problem list  Review of Systems Pertinent items are noted in HPI.    Objective:  Physical Exam HENT: TM's normal, nares normal, oropharynx normal Neck: Normal thyroid, no cervical adenopathy Lungs: normal to oscitation, possible upper airway sounds Heart: heart normal rate and rhythm, normal S1 and S2 sounds; 1+ PT pulses Skin: very dry Musculoskeletal: trace pedal edema bilaterally Abdominal: hyperactive bowel sounds, tenderness with LLQ  Body mass index is 25.65 kg/m. BP 120/60 (BP Location: Left Arm, Patient Position: Sitting, Cuff Size: Large)   Pulse 82   Temp 98.6 F (37 C) (Oral)   Resp 16   Ht 4\' 11"  (1.499 m)   Wt 127 lb (57.6 kg)   SpO2 96%   BMI 25.65 kg/m       Assessment:      1. Medicare annual wellness visit, subsequent   2. Recurrent Clostridium difficile diarrhea   3. Hypothyroidism, unspecified type   4. Dementia without behavioral disturbance, unspecified dementia type   5. Essential hypertension   6. Diarrhea, unspecified type   7. Insomnia   8. Urinary incontinence, unspecified type         Plan:     During the course of the visit the patient was educated and counseled about appropriate screening and preventive services including:   Orders Placed This Encounter  Procedures  . TSH  . CBC with Differential/Platelet  . Comprehensive metabolic panel    Meds ordered this encounter  Medications  . metroNIDAZOLE (FLAGYL) 250 MG tablet    Sig: Take 1 tablet (250 mg total) by mouth 2 (two) times daily.    Dispense:  10 tablet    Refill:  0  . QUEtiapine (SEROQUEL) 25  MG tablet    Sig:  TAKE ONE TABLET AT BEDTIME AS NEEDED FOR INSOMNIA-MAY REPEAT IN 1-2 HOURS IF NEEDED.    Dispense:  180 tablet    Refill:  1  . donepezil (ARICEPT) 10 MG tablet    Sig: Take 1 tablet (10 mg total) by mouth at bedtime.    Dispense:  90 tablet    Refill:  3  . oxybutynin (DITROPAN XL) 15 MG 24 hr tablet    Sig: Take 1 tablet (15 mg total) by mouth at bedtime.    Dispense:  90 tablet    Refill:  1    I personally performed the services described in this documentation, which was scribed in my presence. The recorded information has been reviewed and considered, and addended by me as needed.   Norberto Sorenson, M.D.  Primary Care at [facility] [address] [place], [place] [address] [phone] phone [phone] fax  08/20/17 10:52 AM   Diet review for nutrition referral? Yes ____  Not Indicated ____   Patient Instructions (the written plan) was given to the patient.  Medicare Attestation I have personally reviewed: The patient's medical and social history Their use of alcohol, tobacco or illicit drugs Their current medications and supplements The patient's functional ability including ADLs,fall risks, home safety risks, cognitive, and hearing and visual impairment Diet and physical activities Evidence for depression or mood disorders  The patient's weight, height, BMI, and visual acuity have been recorded in the chart.  I have made referrals, counseling, and provided education to the patient based on review of the above and I have provided the patient with a written personalized care plan for preventive services.     Sherren Mocha, MD   08/17/2017

## 2017-08-18 LAB — COMPREHENSIVE METABOLIC PANEL
A/G RATIO: 1.6 (ref 1.2–2.2)
ALBUMIN: 4.6 g/dL (ref 3.2–4.6)
ALK PHOS: 84 IU/L (ref 39–117)
ALT: 6 IU/L (ref 0–32)
AST: 14 IU/L (ref 0–40)
BUN / CREAT RATIO: 9 — AB (ref 12–28)
BUN: 8 mg/dL — ABNORMAL LOW (ref 10–36)
Bilirubin Total: 0.5 mg/dL (ref 0.0–1.2)
CO2: 21 mmol/L (ref 20–29)
Calcium: 10.8 mg/dL — ABNORMAL HIGH (ref 8.7–10.3)
Chloride: 101 mmol/L (ref 96–106)
Creatinine, Ser: 0.89 mg/dL (ref 0.57–1.00)
GFR calc Af Amer: 66 mL/min/{1.73_m2} (ref >59)
GFR, EST NON AFRICAN AMERICAN: 57 mL/min/{1.73_m2} — AB (ref >59)
GLOBULIN, TOTAL: 2.8 g/dL (ref 1.5–4.5)
Glucose: 71 mg/dL (ref 65–99)
POTASSIUM: 4.7 mmol/L (ref 3.5–5.2)
SODIUM: 141 mmol/L (ref 134–144)
Total Protein: 7.4 g/dL (ref 6.0–8.5)

## 2017-08-18 LAB — CBC WITH DIFFERENTIAL/PLATELET
BASOS: 0 %
Basophils Absolute: 0 10*3/uL (ref 0.0–0.2)
EOS (ABSOLUTE): 0.2 10*3/uL (ref 0.0–0.4)
EOS: 2 %
HEMATOCRIT: 45.4 % (ref 34.0–46.6)
HEMOGLOBIN: 15.2 g/dL (ref 11.1–15.9)
Immature Grans (Abs): 0 10*3/uL (ref 0.0–0.1)
Immature Granulocytes: 0 %
LYMPHS ABS: 2.1 10*3/uL (ref 0.7–3.1)
Lymphs: 19 %
MCH: 31 pg (ref 26.6–33.0)
MCHC: 33.5 g/dL (ref 31.5–35.7)
MCV: 93 fL (ref 79–97)
Monocytes Absolute: 1.1 10*3/uL — ABNORMAL HIGH (ref 0.1–0.9)
Monocytes: 10 %
NEUTROS ABS: 7.8 10*3/uL — AB (ref 1.4–7.0)
Neutrophils: 69 %
Platelets: 357 10*3/uL (ref 150–379)
RBC: 4.91 x10E6/uL (ref 3.77–5.28)
RDW: 14 % (ref 12.3–15.4)
WBC: 11.1 10*3/uL — ABNORMAL HIGH (ref 3.4–10.8)

## 2017-08-18 LAB — TSH: TSH: 0.807 u[IU]/mL (ref 0.450–4.500)

## 2017-09-06 ENCOUNTER — Other Ambulatory Visit: Payer: Self-pay | Admitting: Family Medicine

## 2017-12-05 ENCOUNTER — Other Ambulatory Visit: Payer: Self-pay | Admitting: Family Medicine

## 2017-12-16 DIAGNOSIS — S5701XA Crushing injury of right elbow, initial encounter: Secondary | ICD-10-CM | POA: Diagnosis not present

## 2017-12-22 ENCOUNTER — Inpatient Hospital Stay (HOSPITAL_COMMUNITY)
Admission: EM | Admit: 2017-12-22 | Discharge: 2017-12-25 | DRG: 512 | Disposition: A | Discharge status: Home Health | Payer: Medicare Other | Attending: Internal Medicine | Admitting: Internal Medicine

## 2017-12-22 ENCOUNTER — Emergency Department (HOSPITAL_COMMUNITY): Payer: Medicare Other

## 2017-12-22 ENCOUNTER — Other Ambulatory Visit: Payer: Self-pay

## 2017-12-22 ENCOUNTER — Encounter (HOSPITAL_COMMUNITY): Payer: Self-pay | Admitting: Emergency Medicine

## 2017-12-22 DIAGNOSIS — S59901A Unspecified injury of right elbow, initial encounter: Secondary | ICD-10-CM | POA: Diagnosis not present

## 2017-12-22 DIAGNOSIS — E785 Hyperlipidemia, unspecified: Secondary | ICD-10-CM | POA: Diagnosis present

## 2017-12-22 DIAGNOSIS — Y92481 Parking lot as the place of occurrence of the external cause: Secondary | ICD-10-CM

## 2017-12-22 DIAGNOSIS — F32A Depression, unspecified: Secondary | ICD-10-CM | POA: Diagnosis present

## 2017-12-22 DIAGNOSIS — F329 Major depressive disorder, single episode, unspecified: Secondary | ICD-10-CM | POA: Diagnosis present

## 2017-12-22 DIAGNOSIS — I10 Essential (primary) hypertension: Secondary | ICD-10-CM | POA: Diagnosis not present

## 2017-12-22 DIAGNOSIS — F0390 Unspecified dementia without behavioral disturbance: Secondary | ICD-10-CM | POA: Diagnosis present

## 2017-12-22 DIAGNOSIS — Z79890 Hormone replacement therapy (postmenopausal): Secondary | ICD-10-CM

## 2017-12-22 DIAGNOSIS — Z419 Encounter for procedure for purposes other than remedying health state, unspecified: Secondary | ICD-10-CM

## 2017-12-22 DIAGNOSIS — F419 Anxiety disorder, unspecified: Secondary | ICD-10-CM | POA: Diagnosis not present

## 2017-12-22 DIAGNOSIS — W010XXA Fall on same level from slipping, tripping and stumbling without subsequent striking against object, initial encounter: Secondary | ICD-10-CM | POA: Diagnosis present

## 2017-12-22 DIAGNOSIS — Z880 Allergy status to penicillin: Secondary | ICD-10-CM

## 2017-12-22 DIAGNOSIS — E039 Hypothyroidism, unspecified: Secondary | ICD-10-CM | POA: Diagnosis present

## 2017-12-22 DIAGNOSIS — T148XXA Other injury of unspecified body region, initial encounter: Secondary | ICD-10-CM

## 2017-12-22 DIAGNOSIS — S52021A Displaced fracture of olecranon process without intraarticular extension of right ulna, initial encounter for closed fracture: Secondary | ICD-10-CM

## 2017-12-22 DIAGNOSIS — M810 Age-related osteoporosis without current pathological fracture: Secondary | ICD-10-CM | POA: Diagnosis present

## 2017-12-22 DIAGNOSIS — M25521 Pain in right elbow: Secondary | ICD-10-CM | POA: Diagnosis not present

## 2017-12-22 DIAGNOSIS — E8889 Other specified metabolic disorders: Secondary | ICD-10-CM | POA: Diagnosis present

## 2017-12-22 DIAGNOSIS — R350 Frequency of micturition: Secondary | ICD-10-CM | POA: Diagnosis not present

## 2017-12-22 DIAGNOSIS — Z7982 Long term (current) use of aspirin: Secondary | ICD-10-CM

## 2017-12-22 DIAGNOSIS — S42401A Unspecified fracture of lower end of right humerus, initial encounter for closed fracture: Secondary | ICD-10-CM

## 2017-12-22 DIAGNOSIS — I2510 Atherosclerotic heart disease of native coronary artery without angina pectoris: Secondary | ICD-10-CM | POA: Diagnosis present

## 2017-12-22 DIAGNOSIS — K449 Diaphragmatic hernia without obstruction or gangrene: Secondary | ICD-10-CM | POA: Diagnosis not present

## 2017-12-22 DIAGNOSIS — Y92009 Unspecified place in unspecified non-institutional (private) residence as the place of occurrence of the external cause: Secondary | ICD-10-CM

## 2017-12-22 DIAGNOSIS — Z79899 Other long term (current) drug therapy: Secondary | ICD-10-CM | POA: Diagnosis not present

## 2017-12-22 DIAGNOSIS — W19XXXA Unspecified fall, initial encounter: Secondary | ICD-10-CM

## 2017-12-22 DIAGNOSIS — G8918 Other acute postprocedural pain: Secondary | ICD-10-CM | POA: Diagnosis not present

## 2017-12-22 DIAGNOSIS — S42409A Unspecified fracture of lower end of unspecified humerus, initial encounter for closed fracture: Secondary | ICD-10-CM | POA: Insufficient documentation

## 2017-12-22 DIAGNOSIS — S52031A Displaced fracture of olecranon process with intraarticular extension of right ulna, initial encounter for closed fracture: Secondary | ICD-10-CM | POA: Diagnosis not present

## 2017-12-22 DIAGNOSIS — Z955 Presence of coronary angioplasty implant and graft: Secondary | ICD-10-CM | POA: Diagnosis not present

## 2017-12-22 DIAGNOSIS — S52023A Displaced fracture of olecranon process without intraarticular extension of unspecified ulna, initial encounter for closed fracture: Secondary | ICD-10-CM

## 2017-12-22 DIAGNOSIS — Z87891 Personal history of nicotine dependence: Secondary | ICD-10-CM | POA: Diagnosis not present

## 2017-12-22 HISTORY — DX: Displaced fracture of olecranon process without intraarticular extension of right ulna, initial encounter for closed fracture: S52.021A

## 2017-12-22 LAB — CBC
HCT: 39.6 % (ref 36.0–46.0)
Hemoglobin: 12.5 g/dL (ref 12.0–15.0)
MCH: 29.8 pg (ref 26.0–34.0)
MCHC: 31.6 g/dL (ref 30.0–36.0)
MCV: 94.3 fL (ref 78.0–100.0)
Platelets: 245 10*3/uL (ref 150–400)
RBC: 4.2 MIL/uL (ref 3.87–5.11)
RDW: 13.3 % (ref 11.5–15.5)
WBC: 9.4 10*3/uL (ref 4.0–10.5)

## 2017-12-22 LAB — HEPATIC FUNCTION PANEL
ALK PHOS: 52 U/L (ref 38–126)
ALT: 9 U/L — AB (ref 14–54)
AST: 16 U/L (ref 15–41)
Albumin: 3.4 g/dL — ABNORMAL LOW (ref 3.5–5.0)
Total Bilirubin: 0.8 mg/dL (ref 0.3–1.2)
Total Protein: 6.4 g/dL — ABNORMAL LOW (ref 6.5–8.1)

## 2017-12-22 LAB — BASIC METABOLIC PANEL
ANION GAP: 9 (ref 5–15)
BUN: 15 mg/dL (ref 6–20)
CHLORIDE: 109 mmol/L (ref 101–111)
CO2: 25 mmol/L (ref 22–32)
Calcium: 9.7 mg/dL (ref 8.9–10.3)
Creatinine, Ser: 0.98 mg/dL (ref 0.44–1.00)
GFR calc Af Amer: 57 mL/min — ABNORMAL LOW (ref >60)
GFR calc non Af Amer: 49 mL/min — ABNORMAL LOW (ref >60)
GLUCOSE: 128 mg/dL — AB (ref 65–99)
POTASSIUM: 3.8 mmol/L (ref 3.5–5.1)
Sodium: 143 mmol/L (ref 135–145)

## 2017-12-22 LAB — SURGICAL PCR SCREEN
MRSA, PCR: NEGATIVE
STAPHYLOCOCCUS AUREUS: NEGATIVE

## 2017-12-22 MED ORDER — SERTRALINE HCL 100 MG PO TABS
100.0000 mg | ORAL_TABLET | Freq: Every day | ORAL | Status: DC
  Administered 2017-12-22 – 2017-12-25 (×4): 100 mg via ORAL
  Filled 2017-12-22 (×4): qty 1

## 2017-12-22 MED ORDER — SODIUM CHLORIDE 0.45 % IV SOLN
INTRAVENOUS | Status: DC
  Administered 2017-12-22 – 2017-12-24 (×2): via INTRAVENOUS

## 2017-12-22 MED ORDER — MORPHINE SULFATE (PF) 4 MG/ML IV SOLN
1.0000 mg | INTRAVENOUS | Status: DC | PRN
  Administered 2017-12-22 – 2017-12-23 (×2): 1 mg via INTRAVENOUS
  Filled 2017-12-22 (×2): qty 1

## 2017-12-22 MED ORDER — OXYCODONE-ACETAMINOPHEN 5-325 MG PO TABS
1.0000 | ORAL_TABLET | ORAL | Status: AC | PRN
  Administered 2017-12-22 (×2): 1 via ORAL
  Filled 2017-12-22 (×2): qty 1

## 2017-12-22 MED ORDER — ONDANSETRON HCL 4 MG PO TABS: 4.0000 mg | ORAL_TABLET | Freq: Four times a day (QID) | ORAL | Status: DC | PRN

## 2017-12-22 MED ORDER — OXYBUTYNIN CHLORIDE ER 15 MG PO TB24
15.0000 mg | ORAL_TABLET | Freq: Every day | ORAL | Status: DC
  Administered 2017-12-22 – 2017-12-25 (×3): 15 mg via ORAL
  Filled 2017-12-22 (×3): qty 1

## 2017-12-22 MED ORDER — HEPARIN SODIUM (PORCINE) 5000 UNIT/ML IJ SOLN
5000.0000 [IU] | Freq: Three times a day (TID) | INTRAMUSCULAR | Status: AC
  Administered 2017-12-22 (×2): 5000 [IU] via SUBCUTANEOUS
  Filled 2017-12-22 (×2): qty 1

## 2017-12-22 MED ORDER — ONDANSETRON HCL 4 MG/2ML IJ SOLN: 4.0000 mg | Freq: Four times a day (QID) | INTRAMUSCULAR | Status: DC | PRN

## 2017-12-22 MED ORDER — QUETIAPINE FUMARATE 50 MG PO TABS
25.0000 mg | ORAL_TABLET | Freq: Every evening | ORAL | Status: DC | PRN
  Administered 2017-12-22 – 2017-12-24 (×3): 25 mg via ORAL
  Filled 2017-12-22 (×3): qty 1

## 2017-12-22 MED ORDER — POVIDONE-IODINE 10 % EX SWAB
2.0000 "application " | Freq: Once | CUTANEOUS | Status: AC
  Administered 2017-12-23: 2 via TOPICAL

## 2017-12-22 MED ORDER — LEVOTHYROXINE SODIUM 88 MCG PO TABS
88.0000 ug | ORAL_TABLET | Freq: Every day | ORAL | Status: DC
Start: 2017-12-23 — End: 2017-12-25
  Administered 2017-12-23 – 2017-12-25 (×3): 88 ug via ORAL
  Filled 2017-12-22 (×3): qty 1

## 2017-12-22 MED ORDER — ACETAMINOPHEN 325 MG PO TABS
650.0000 mg | ORAL_TABLET | Freq: Four times a day (QID) | ORAL | Status: DC | PRN
  Administered 2017-12-22: 650 mg via ORAL
  Filled 2017-12-22 (×2): qty 2

## 2017-12-22 MED ORDER — OCUVITE-LUTEIN PO CAPS
1.0000 | ORAL_CAPSULE | Freq: Every day | ORAL | Status: DC
  Filled 2017-12-22 (×2): qty 1

## 2017-12-22 MED ORDER — ACETAMINOPHEN 650 MG RE SUPP: 650.0000 mg | Freq: Four times a day (QID) | RECTAL | Status: DC | PRN

## 2017-12-22 MED ORDER — ASPIRIN EC 81 MG PO TBEC
81.0000 mg | DELAYED_RELEASE_TABLET | Freq: Every day | ORAL | Status: DC
  Administered 2017-12-24 – 2017-12-25 (×2): 81 mg via ORAL
  Filled 2017-12-22 (×2): qty 1

## 2017-12-22 MED ORDER — VITAMIN D 1000 UNITS PO TABS
2000.0000 [IU] | ORAL_TABLET | Freq: Every day | ORAL | Status: DC
Start: 2017-12-22 — End: 2017-12-25
  Administered 2017-12-24 – 2017-12-25 (×2): 2000 [IU] via ORAL
  Filled 2017-12-22 (×2): qty 2

## 2017-12-22 MED ORDER — CLINDAMYCIN PHOSPHATE 900 MG/50ML IV SOLN
900.0000 mg | INTRAVENOUS | Status: AC
  Administered 2017-12-23: 900 mg via INTRAVENOUS
  Filled 2017-12-22: qty 50

## 2017-12-22 MED ORDER — CHLORHEXIDINE GLUCONATE 4 % EX LIQD
60.0000 mL | Freq: Once | CUTANEOUS | Status: AC
  Administered 2017-12-23: 4 via TOPICAL
  Filled 2017-12-22: qty 60

## 2017-12-22 NOTE — Progress Notes (Signed)
1735 Received pt from ED, A&O x3. Right arm splint on, fingers are warm and mobile. Spouse and daughter at the bedside, pt signed consent for surgery.

## 2017-12-22 NOTE — ED Notes (Signed)
Reg tray ordered 

## 2017-12-22 NOTE — Consult Note (Addendum)
Reason for Consult:Left olecranon fx Referring Physician: [name] is an 84 y.o. female.  HPI: Brianna Reynolds was walking outside last Saturday when she tripped on a brick and fell onto her right side. She was ambulating with a walker as usual. She had right elbow pain and family took her to [facility] where x-rays were reportedly inconclusive. When she didn't get better she was referred to orthopedics where x-rays showed a right olecranon fx. She was sent to the ED at [facility] for fixation by the orthopedic trauma service. She is RHD. Her memory is quite impaired and history was provided by family in the room.  Past Medical History:  Diagnosis Date  . Abnormal EKG   . Anxiety   . C. difficile colitis   . Cataract   . Fatigue   . HTN (hypertension)    New  . Hyperlipidemia   . Hypothyroid   . Memory loss   . Pneumonia     Past Surgical History:  Procedure Laterality Date  . BREAST SURGERY Bilateral   . COLONOSCOPY    . COLONOSCOPY N/A 10/27/2014   Procedure: COLONOSCOPY;  Surgeon: Iva Boop, MD;  Location[facility]OSCOPY;  Service: Endoscopy;  Laterality: N/A;  . FECAL TRANSPLANT N/A 10/27/2014  . INTRAMEDULLARY (IM) NAIL INTERTROCHANTERIC Right 12/23/2016   Procedure: RIGHT INTRAMEDULLARY (IM) NAIL INTERTROCHANTRIC;  Surgeon: Samson Frederic, MD;  Location: [facility] OR;  Service: Orthopedics;  Laterality: Right;  . PARATHYROIDECTOMY    . THYROIDECTOMY, PARTIAL    . TONSILLECTOMY      Family History  Problem Relation Age of Onset  . Arthritis Mother     Social History:  reports that she quit smoking about 26 years ago. Her smoking use included cigarettes. She has never used smokeless tobacco. She reports that she does not drink alcohol or use drugs.  Allergies:  Allergies  Allergen Reactions  . Penicillins Rash    Medications: I have reviewed the patient's current medications.  No results found for this or any previous visit (from the past 48 hour(s)).  No results  found.  Review of Systems  Constitutional: Negative for weight loss.  HENT: Negative for ear discharge, ear pain, hearing loss and tinnitus.   Eyes: Negative for blurred vision, double vision, photophobia and pain.  Respiratory: Negative for cough, sputum production and shortness of breath.   Cardiovascular: Negative for chest pain.  Gastrointestinal: Negative for abdominal pain, nausea and vomiting.  Genitourinary: Negative for dysuria, flank pain, frequency and urgency.  Musculoskeletal: Positive for joint pain (Right elbow). Negative for back pain, falls, myalgias and neck pain.  Neurological: Negative for dizziness, tingling, sensory change, focal weakness, loss of consciousness and headaches.  Endo/Heme/Allergies: Does not bruise/bleed easily.  Psychiatric/Behavioral: Negative for depression, memory loss and substance abuse. The patient is not nervous/anxious.    Blood pressure (!) 163/52, pulse 70, temperature 98.8 F (37.1 C), temperature source Oral, resp. rate 16, SpO2 96 %. Physical Exam  Constitutional: She appears well-developed and well-nourished. No distress.  HENT:  Head: Normocephalic and atraumatic.  Eyes: Conjunctivae are normal. Right eye exhibits no discharge. Left eye exhibits no discharge. No scleral icterus.  Neck: Normal range of motion.  Cardiovascular: Normal rate and regular rhythm.  Respiratory: Effort normal. No respiratory distress.  Musculoskeletal:  Right shoulder, elbow, wrist, digits- Elbow splint in place  Sens  Ax/M/U intact, R paresthetic to absent  Mot   Ax/ R/ PIN/ M/ AIN/ U intact  Rad 2+  Neurological:  She is alert.  Skin: Skin is warm and dry. She is not diaphoretic.  Psychiatric: She has a normal mood and affect. Her behavior is normal.    Assessment/Plan: Fall Right elbow fx -- Tentative plan for fixation Tuesday by Dr. Carola Frost.  Continue splint. Dementia -- Appreciate IM managing likely challenging pain control in this elderly demented  woman HTN Hypothyroidism    Brianna Caldron, PA-C Orthopedic Surgery [phone] 12/22/2017, 12:11 PM

## 2017-12-22 NOTE — ED Provider Notes (Signed)
Patient tripped and fell one week ago injuring her right elbow. No other injury. She'll alert Glasgow Coma Score 15 no distress   Doug Sou, MD 12/22/17 1240

## 2017-12-22 NOTE — ED Notes (Signed)
Blood work collected at [facility], but time clicked off and shows in [employer] as being completed at 1:25am - lab notified. [employer] labels state correct time.

## 2017-12-22 NOTE — ED Provider Notes (Signed)
[facility] EMERGENCY DEPARTMENT Provider Note   CSN: [id number] Arrival date & time: 12/22/17  1124     History   Chief Complaint Chief Complaint  Patient presents with  . Arm Injury    HPI Brianna Reynolds is a 84 y.o. female.  HPI   Brianna Reynolds is a 84yo female with a history of memory loss, hypertension, hyperlipidemia who presents to the emergency department from [facility] orthopedics for evaluation of right elbow injury.  Patient's family provides the majority of the history.  They state that she had a mechanical fall last Saturday in which she hit her right elbow.  Family witnessed the fall, no report of hitting her head or loss of consciousness.  She was seen at the urgent care for persistent right elbow pain, and told that the films were inconclusive.  Given she continued to have pain, she was seen in [facility] orthopedics today who diagnosed her with a right olecranon fracture.  They sent her here to be admitted to MedSurg for planned surgical intervention tomorrow.  Patient was given a Percocet prior to me seeing her.  She denies any pain at this time.  Denies numbness, weakness, headache, chest pain, shortness of breath, abdominal pain, nausea/vomiting.  Past Medical History:  Diagnosis Date  . Abnormal EKG   . Anxiety   . C. difficile colitis   . Cataract   . Fatigue   . HTN (hypertension)    New  . Hyperlipidemia   . Hypothyroid   . Memory loss   . Pneumonia     Patient Active Problem List   Diagnosis Date Noted  . Closed comminuted intertrochanteric fracture of proximal end of right femur (HCC) 12/23/2016  . Closed right hip fracture (HCC) 12/22/2016  . Fall at home, initial encounter 12/22/2016  . Dementia 12/22/2016  . Amnesia 01/08/2015  . Cerebrovascular small vessel disease 01/08/2015  . Idiopathic peripheral neuropathy 01/08/2015  . Atherosclerotic cerebrovascular disease 01/08/2015  . Cough   . Recurrent Clostridium  difficile diarrhea 06/13/2014  . Leukocytosis 06/13/2014  . Dehydration 06/13/2014  . Hypothyroid   . Insomnia 02/28/2013  . Abnormal serum protein electrophoresis 02/11/2013  . Abnormal EKG   . HTN (hypertension)   . Fatigue     Past Surgical History:  Procedure Laterality Date  . BREAST SURGERY Bilateral   . COLONOSCOPY    . COLONOSCOPY N/A 10/27/2014   Procedure: COLONOSCOPY;  Surgeon: Iva Boop, MD;  Location: [facility] ENDOSCOPY;  Service: Endoscopy;  Laterality: N/A;  . FECAL TRANSPLANT N/A 10/27/2014  . INTRAMEDULLARY (IM) NAIL INTERTROCHANTERIC Right 12/23/2016   Procedure: RIGHT INTRAMEDULLARY (IM) NAIL INTERTROCHANTRIC;  Surgeon: Samson Frederic, MD;  Location: [facility] OR;  Service: Orthopedics;  Laterality: Right;  . PARATHYROIDECTOMY    . THYROIDECTOMY, PARTIAL    . TONSILLECTOMY       OB History   None      Home Medications    Prior to Admission medications   Medication Sig Start Date End Date Taking? Authorizing Provider  acetaminophen (TYLENOL) 325 MG tablet Take 650 mg by mouth every 6 (six) hours as needed for headache (headache).    [provider]  aspirin EC 81 MG tablet Take 81 mg by mouth daily.    [provider]  bifidobacterium infantis (ALIGN) capsule Take 1 capsule by mouth daily. 01/20/16   Weber, Dema Severin, PA-C  Calcium Carbonate (CALTRATE 600 PO) Take 1 tablet by mouth daily.    [provider]  cetirizine (ZYRTEC) 10 MG tablet Take 1 tablet (10 mg total) at bedtime by mouth. 06/08/17   Sherren Mocha, MD  Cholecalciferol (VITAMIN D) 2000 UNITS CAPS Take 1 capsule by mouth daily.     [provider]  donepezil (ARICEPT) 10 MG tablet Take 1 tablet (10 mg total) by mouth at bedtime. 08/17/17   Sherren Mocha, MD  levothyroxine (SYNTHROID, LEVOTHROID) 88 MCG tablet TAKE 1 TABLET IN THE MORNING BEFORE BREAKFAST. 12/05/17   Sherren Mocha, MD  Melatonin 5 MG TABS Take 5 mg by mouth at bedtime.     [provider]  metroNIDAZOLE  (FLAGYL) 250 MG tablet Take 1 tablet (250 mg total) by mouth 2 (two) times daily. 08/17/17   Sherren Mocha, MD  Multiple Vitamins-Minerals (PRESERVISION AREDS) CAPS Take 1 capsule by mouth daily.    [provider]  oxybutynin (DITROPAN XL) 15 MG 24 hr tablet Take 1 tablet (15 mg total) by mouth at bedtime. 08/17/17   Sherren Mocha, MD  QUEtiapine (SEROQUEL) 25 MG tablet TAKE ONE TABLET AT BEDTIME AS NEEDED FOR INSOMNIA-MAY REPEAT IN 1-2 HOURS IF NEEDED. 08/17/17   Sherren Mocha, MD  sertraline (ZOLOFT) 100 MG tablet Take 1 tablet (100 mg total) daily by mouth. 06/08/17   Sherren Mocha, MD    Family History Family History  Problem Relation Age of Onset  . Arthritis Mother     Social History Social History   Tobacco Use  . Smoking status: Former Smoker    Types: Cigarettes    Last attempt to quit: 02/10/1991    Years since quitting: 26.8  . Smokeless tobacco: Never Used  Substance Use Topics  . Alcohol use: No    Alcohol/week: 0.0 oz    Comment: rarely  . Drug use: No     Allergies   Penicillins   Review of Systems Review of Systems  Constitutional: Negative for chills and fever.  Eyes: Negative for visual disturbance.  Respiratory: Negative for shortness of breath and wheezing.   Cardiovascular: Negative for chest pain.  Gastrointestinal: Negative for abdominal pain, nausea and vomiting.  Genitourinary: Negative for difficulty urinating.  Musculoskeletal: Positive for arthralgias (right elbow).  Skin: Negative for wound.  Neurological: Negative for weakness, numbness and headaches.  Psychiatric/Behavioral: Negative for agitation.     Physical Exam Updated Vital Signs BP (!) 163/52 (BP Location: Right Arm)   Pulse 70   Temp 98.8 F (37.1 C) (Oral)   Resp 16   SpO2 96%   Physical Exam  Constitutional: She appears well-developed and well-nourished. No distress.  Sitting in bed in no apparent distress, nontoxic-appearing.  HENT:  Head: Normocephalic and  atraumatic.  Mouth/Throat: Oropharynx is clear and moist. No oropharyngeal exudate.  Eyes: Pupils are equal, round, and reactive to light. Conjunctivae are normal. Right eye exhibits no discharge. Left eye exhibits no discharge.  Neck: Normal range of motion. Neck supple.  Cardiovascular: Normal rate, regular rhythm and intact distal pulses.  Pulmonary/Chest: Effort normal. No respiratory distress.  No respiratory distress, speaking in full sentences.  Bilateral expiratory rhonchi in lung bases.  No stridor, rales.  Abdominal: Soft. Bowel sounds are normal. There is no tenderness. There is no guarding.  Musculoskeletal:  Right upper extremity wrapped in Ace bandage from shoulder to wrist.  Radial pulses 2+ bilaterally.  Distal sensation to light touch intact in bilateral lower extremities.  Neurological: She is alert. Coordination normal.  Skin: Skin is warm and dry. She  is not diaphoretic.  Psychiatric: She has a normal mood and affect. Her behavior is normal.  Nursing note and vitals reviewed.    ED Treatments / Results  Labs (all labs ordered are listed, but only abnormal results are displayed) Labs Reviewed  BASIC METABOLIC PANEL - Abnormal; Notable for the following components:      Result Value   Glucose, Bld 128 (*)    GFR calc non Af Amer 49 (*)    GFR calc Af Amer 57 (*)    All other components within normal limits  CBC  COMPREHENSIVE METABOLIC PANEL    EKG None  Radiology Dg Elbow Complete Right (3+view)  Result Date: 12/22/2017 CLINICAL DATA:  Recent fall with right posterior elbow pain EXAM: RIGHT ELBOW - COMPLETE 3+ VIEW COMPARISON:  None. FINDINGS: Splinting material is noted somewhat precluding fine bony detail and the ability to fully straighten the arm. A olecranon process fracture extending into the articular surface is noted with distraction of approximately the 11 mm. There also changes suspicious for radial head fracture although incompletely evaluated  cortical fracture is noted in the distal humerus as well. IMPRESSION: Multiple fractures about the elbow. If further imaging is necessary CT may be the best option for fine bony detail. Electronically Signed   By: Alcide Clever M.D.   On: 12/22/2017 14:15    Procedures Procedures (including critical care time)  Medications Ordered in ED Medications  oxyCODONE-acetaminophen (PERCOCET/ROXICET) 5-325 MG per tablet 1 tablet (1 tablet Oral Given 12/22/17 1207)     Initial Impression / Assessment and Plan / ED Course  I have reviewed the triage vital signs and the nursing notes.  Pertinent labs & imaging results that were available during my care of the patient were reviewed by me and considered in my medical decision making (see chart for details).    Patient presents from [facility] orthopedics for right elbow fracture.  Dale Buckley, PA-C evaluated the patient prior to me seeing her.  States that she will need admission for planned surgical intervention by Dr. Carola Frost tomorrow.  Otherwise patient is stable.  She has no complaints.  Rhonchi heard in bilateral bases, will get chest x-ray for further evaluation.  CBC and CMP ordered for admission.  Discussed this patient with Hospitalist Dr. Arlean Hopping who will admit to medicine.  This was a shared encounter with Dr. Rennis Chris who also saw the patient and agrees with plan to admit. Patient and family at bedside informed.  Final Clinical Impressions(s) / ED Diagnoses   Final diagnoses:  Elbow fracture, right    ED Discharge Orders    None       Lawrence Marseilles 12/23/17 2147    Doug Sou, MD 12/24/17 0700

## 2017-12-22 NOTE — H&P (Signed)
[facility] History and Physical  Brianna Reynolds:[id number] DOB: 06/24/1924 DOA: 12/22/2017  Referring physician: Dr. Ethelda Chick PCP: Sherren Mocha, MD   Chief Complaint: Elbow fracture/ fall, dementia  HPI: Brianna Reynolds is a 84 y.o. female with hx of CAD sp stent, Cdif and dementia fell walking in a parking lot with her walker today and came to ED where work-up shows R elbow fracture.  Has been seen by orthopedics who are considering surgery tomorrow.  Asked to admit to medical service.    Pt is not good historian with severe dementia family is all here and answering quetsions, patient smoked but quit 50 yrs ago no hx of lung disease asthma use of nebs or inhalers and no cardiac hx not hx stents or heart attack or chf.      Chart:  Aug 2015 - acute TME/ confusion, high LA level 6.5, similar episodes in past w/u negative prob infection w low grade temp  NOv 2015 - Cdif infection recurrent , HTN, low K, dehydration , depression  May 2018 - fall with R hip fracutre undewent ORIF     ROS  denies CP  denies abd pain  no headache   Past Medical History  Past Medical History:  Diagnosis Date  . Abnormal EKG   . Anxiety   . C. difficile colitis   . Cataract   . Fatigue   . HTN (hypertension)    New  . Hyperlipidemia   . Hypothyroid   . Memory loss   . Pneumonia    Past Surgical History  Past Surgical History:  Procedure Laterality Date  . BREAST SURGERY Bilateral   . COLONOSCOPY    . COLONOSCOPY N/A 10/27/2014   Procedure: COLONOSCOPY;  Surgeon: Iva Boop, MD;  Location: [facility] ENDOSCOPY;  Service: Endoscopy;  Laterality: N/A;  . FECAL TRANSPLANT N/A 10/27/2014  . INTRAMEDULLARY (IM) NAIL INTERTROCHANTERIC Right 12/23/2016   Procedure: RIGHT INTRAMEDULLARY (IM) NAIL INTERTROCHANTRIC;  Surgeon: Samson Frederic, MD;  Location: [facility] OR;  Service: Orthopedics;  Laterality: Right;  . PARATHYROIDECTOMY    . THYROIDECTOMY, PARTIAL    . TONSILLECTOMY     Family  History  Family History  Problem Relation Age of Onset  . Arthritis Mother    Social History  reports that she quit smoking about 26 years ago. Her smoking use included cigarettes. She has never used smokeless tobacco. She reports that she does not drink alcohol or use drugs. Allergies  Allergies  Allergen Reactions  . Penicillins Rash   Home medications Prior to Admission medications   Medication Sig Start Date End Date Taking? Authorizing Provider  acetaminophen (TYLENOL) 325 MG tablet Take 650 mg by mouth every 6 (six) hours as needed for headache (headache).    [provider]  aspirin EC 81 MG tablet Take 81 mg by mouth daily.    [provider]  bifidobacterium infantis (ALIGN) capsule Take 1 capsule by mouth daily. 01/20/16   Weber, Dema Severin, PA-C  Calcium Carbonate (CALTRATE 600 PO) Take 1 tablet by mouth daily.    [provider]  cetirizine (ZYRTEC) 10 MG tablet Take 1 tablet (10 mg total) at bedtime by mouth. 06/08/17   Sherren Mocha, MD  Cholecalciferol (VITAMIN D) 2000 UNITS CAPS Take 1 capsule by mouth daily.     [provider]  donepezil (ARICEPT) 10 MG tablet Take 1 tablet (10 mg total) by mouth at bedtime. 08/17/17   Sherren Mocha, MD  levothyroxine (  SYNTHROID, LEVOTHROID) 88 MCG tablet TAKE 1 TABLET IN THE MORNING BEFORE BREAKFAST. 12/05/17   Sherren Mocha, MD  Melatonin 5 MG TABS Take 5 mg by mouth at bedtime.     [provider]  metroNIDAZOLE (FLAGYL) 250 MG tablet Take 1 tablet (250 mg total) by mouth 2 (two) times daily. 08/17/17   Sherren Mocha, MD  Multiple Vitamins-Minerals (PRESERVISION AREDS) CAPS Take 1 capsule by mouth daily.    [provider]  oxybutynin (DITROPAN XL) 15 MG 24 hr tablet Take 1 tablet (15 mg total) by mouth at bedtime. 08/17/17   Sherren Mocha, MD  QUEtiapine (SEROQUEL) 25 MG tablet TAKE ONE TABLET AT BEDTIME AS NEEDED FOR INSOMNIA-MAY REPEAT IN 1-2 HOURS IF NEEDED. 08/17/17   Sherren Mocha, MD  sertraline  (ZOLOFT) 100 MG tablet Take 1 tablet (100 mg total) daily by mouth. 06/08/17   Sherren Mocha, MD   Liver Function Tests No results for input(s): AST, ALT, ALKPHOS, BILITOT, PROT, ALBUMIN in the last 168 hours. No results for input(s): LIPASE, AMYLASE in the last 168 hours. CBC Recent Labs  Lab 12/22/17 1326  WBC 9.4  HGB 12.5  HCT 39.6  MCV 94.3  PLT 245   Basic Metabolic Panel Recent Labs  Lab 12/22/17 1326  NA 143  K 3.8  CL 109  CO2 25  GLUCOSE 128*  BUN 15  CREATININE 0.98  CALCIUM 9.7     Vitals:   12/22/17 1203 12/22/17 1415 12/22/17 1525  BP: (!) 163/52 (!) 155/60 (!) 118/49  Pulse: 70 68   Resp: 16 14   Temp: 98.8 F (37.1 C)    TempSrc: Oral    SpO2: 96% 98%    Exam: Gen elderly WF in no distress very poor memory of recent events, no distress No rash, cyanosis or gangrene Sclera anicteric, throat clear  No jvd or bruits Chest some scattered mild coarse bilat crackles which improve w breathing, no wheezing RRR no MRG no jvd Abd soft ntnd no mass or ascites +bs GU defer  MS R arm in soft cast/ ace wrap Ext no LE or UE edema / no wounds or ulcers Neuro is alert, Ox 1 , nf    Home meds: -ecasa / aricept 10/ synthroid/ flagyl bid/ mvi/ seroquel 25 hs prn/ zoloft 100 -prns/ probiotics/ vitamins/ supplements/ ditropan xl qhs   Na 143 K 3.8  BN 15  Cr 0.98   WBC 9k  Hb 12 plt 245  EKG (independ reviewed) >  Sinus rhythm Atrial premature complex Probable left atrial enlargement No significant change was found  CXR (independ reviewed) > IMPRESSION: 1.  Low lung volumes with mild basilar atelectasis/infiltrates. 2.  Stable cardiomegaly.  No pulmonary venous congestion. 3.  Sliding hiatal hernia.   Assessment: 1  Mechanical fall  2  R elbow fracture - per ortho likely for surgery tomorrow 3  Dementia - significant memory deficits but still is ambulatory 4  Depression - cont meds 5  Hypothyroid - cont T4 6  DVT proph - SQ heparin tid, hold  for surg  Plan - no signs of any significant lung or heart disease will d/w ortho medically stable for surgery, some ^'d risk due to age and dementia.  Have d/w family and answered questions.  Have d/w ortho who says plan at this time is for surgery tomorrow and possibly dc thereafter if pain control is adequate.         Alix Stowers D  [facility] Pager [phone]   If 7PM-7AM, please contact night-coverage [email or web address] Password [email or web address] 12/22/2017, 3:37 PM

## 2017-12-22 NOTE — ED Triage Notes (Signed)
Patient sent to ED by Dr Linna Caprice for admission to have surgery to repair a fracture in her right arm after a fall. Plan is to have surgery tomorrow.

## 2017-12-22 NOTE — ED Notes (Signed)
Surgeon at [facility]

## 2017-12-23 ENCOUNTER — Inpatient Hospital Stay (HOSPITAL_COMMUNITY): Payer: Medicare Other

## 2017-12-23 ENCOUNTER — Encounter (HOSPITAL_COMMUNITY)
Admission: EM | Disposition: A | Discharge status: Home Health | Payer: Self-pay | Source: Home / Self Care | Attending: Internal Medicine

## 2017-12-23 ENCOUNTER — Inpatient Hospital Stay (HOSPITAL_COMMUNITY): Payer: Medicare Other | Admitting: Anesthesiology

## 2017-12-23 DIAGNOSIS — S52021A Displaced fracture of olecranon process without intraarticular extension of right ulna, initial encounter for closed fracture: Secondary | ICD-10-CM

## 2017-12-23 HISTORY — PX: ORIF ELBOW FRACTURE: SHX5031

## 2017-12-23 SURGERY — OPEN REDUCTION INTERNAL FIXATION (ORIF) ELBOW/OLECRANON FRACTURE
Anesthesia: General | Site: Elbow | Laterality: Right

## 2017-12-23 MED ORDER — DOCUSATE SODIUM 100 MG PO CAPS
100.0000 mg | ORAL_CAPSULE | Freq: Two times a day (BID) | ORAL | Status: DC
  Administered 2017-12-24 – 2017-12-25 (×4): 100 mg via ORAL
  Filled 2017-12-23 (×4): qty 1

## 2017-12-23 MED ORDER — METOCLOPRAMIDE HCL 5 MG PO TABS: 5.0000 mg | ORAL_TABLET | Freq: Three times a day (TID) | ORAL | Status: DC | PRN

## 2017-12-23 MED ORDER — FENTANYL CITRATE (PF) 100 MCG/2ML IJ SOLN
INTRAMUSCULAR | Status: DC | PRN
  Administered 2017-12-23 (×3): 50 ug via INTRAVENOUS

## 2017-12-23 MED ORDER — ONDANSETRON HCL 4 MG PO TABS: 4.0000 mg | ORAL_TABLET | Freq: Four times a day (QID) | ORAL | Status: DC | PRN

## 2017-12-23 MED ORDER — METOCLOPRAMIDE HCL 5 MG/ML IJ SOLN: 5.0000 mg | Freq: Three times a day (TID) | INTRAMUSCULAR | Status: DC | PRN

## 2017-12-23 MED ORDER — ONDANSETRON HCL 4 MG/2ML IJ SOLN
INTRAMUSCULAR | Status: DC | PRN
  Administered 2017-12-23: 4 mg via INTRAVENOUS

## 2017-12-23 MED ORDER — CLINDAMYCIN PHOSPHATE 600 MG/50ML IV SOLN
600.0000 mg | Freq: Four times a day (QID) | INTRAVENOUS | Status: AC
  Administered 2017-12-24 (×3): 600 mg via INTRAVENOUS
  Filled 2017-12-23 (×5): qty 50

## 2017-12-23 MED ORDER — SUGAMMADEX SODIUM 200 MG/2ML IV SOLN
INTRAVENOUS | Status: DC | PRN
  Administered 2017-12-23: 120 mg via INTRAVENOUS

## 2017-12-23 MED ORDER — ONDANSETRON HCL 4 MG/2ML IJ SOLN: 4.0000 mg | Freq: Four times a day (QID) | INTRAMUSCULAR | Status: DC | PRN

## 2017-12-23 MED ORDER — DEXAMETHASONE SODIUM PHOSPHATE 4 MG/ML IJ SOLN
INTRAMUSCULAR | Status: DC | PRN
  Administered 2017-12-23: 4 mg via INTRAVENOUS

## 2017-12-23 MED ORDER — POLYETHYLENE GLYCOL 3350 17 G PO PACK
17.0000 g | PACK | Freq: Every day | ORAL | Status: DC
  Administered 2017-12-24: 17 g via ORAL
  Filled 2017-12-23: qty 1

## 2017-12-23 MED ORDER — FENTANYL CITRATE (PF) 250 MCG/5ML IJ SOLN
INTRAMUSCULAR | Status: AC
  Filled 2017-12-23: qty 5

## 2017-12-23 MED ORDER — 0.9 % SODIUM CHLORIDE (POUR BTL) OPTIME
TOPICAL | Status: DC | PRN
  Administered 2017-12-23: 1000 mL

## 2017-12-23 MED ORDER — PROPOFOL 10 MG/ML IV BOLUS
INTRAVENOUS | Status: DC | PRN
  Administered 2017-12-23: 80 mg via INTRAVENOUS

## 2017-12-23 MED ORDER — FENTANYL CITRATE (PF) 100 MCG/2ML IJ SOLN: 25.0000 ug | INTRAMUSCULAR | Status: DC | PRN

## 2017-12-23 MED ORDER — ROCURONIUM BROMIDE 10 MG/ML (PF) SYRINGE
PREFILLED_SYRINGE | INTRAVENOUS | Status: DC | PRN
  Administered 2017-12-23: 30 mg via INTRAVENOUS
  Administered 2017-12-23: 5 mg via INTRAVENOUS

## 2017-12-23 MED ORDER — ACETAMINOPHEN 325 MG PO TABS: 325.0000 mg | ORAL_TABLET | Freq: Four times a day (QID) | ORAL | Status: DC | PRN

## 2017-12-23 MED ORDER — LIDOCAINE HCL (CARDIAC) PF 100 MG/5ML IV SOSY
PREFILLED_SYRINGE | INTRAVENOUS | Status: DC | PRN
  Administered 2017-12-23: 40 mg via INTRAVENOUS

## 2017-12-23 MED ORDER — PROSIGHT PO TABS
1.0000 | ORAL_TABLET | Freq: Every day | ORAL | Status: DC
  Administered 2017-12-24 – 2017-12-25 (×2): 1 via ORAL
  Filled 2017-12-23 (×2): qty 1

## 2017-12-23 MED ORDER — LACTATED RINGERS IV SOLN
INTRAVENOUS | Status: DC
  Administered 2017-12-23: 11:00:00 via INTRAVENOUS

## 2017-12-23 MED ORDER — PHENYLEPHRINE HCL 10 MG/ML IJ SOLN
INTRAMUSCULAR | Status: DC | PRN
  Administered 2017-12-23: 40 ug via INTRAVENOUS

## 2017-12-23 MED ORDER — PROMETHAZINE HCL 25 MG/ML IJ SOLN: 6.2500 mg | INTRAMUSCULAR | Status: DC | PRN

## 2017-12-23 MED ORDER — ESMOLOL HCL 100 MG/10ML IV SOLN
INTRAVENOUS | Status: DC | PRN
  Administered 2017-12-23: 15 mg via INTRAVENOUS

## 2017-12-23 MED ORDER — PHENYLEPHRINE HCL 10 MG/ML IJ SOLN
INTRAMUSCULAR | Status: DC | PRN
  Administered 2017-12-23: 15 ug/min via INTRAVENOUS

## 2017-12-23 MED ORDER — HYDROCODONE-ACETAMINOPHEN 5-325 MG PO TABS
1.0000 | ORAL_TABLET | Freq: Four times a day (QID) | ORAL | Status: DC | PRN
  Administered 2017-12-24: 1 via ORAL
  Filled 2017-12-23: qty 1

## 2017-12-23 MED ORDER — SUCCINYLCHOLINE CHLORIDE 200 MG/10ML IV SOSY
PREFILLED_SYRINGE | INTRAVENOUS | Status: DC | PRN
  Administered 2017-12-23: 80 mg via INTRAVENOUS

## 2017-12-23 MED ORDER — BUPIVACAINE HCL (PF) 0.25 % IJ SOLN
INTRAMUSCULAR | Status: AC
  Filled 2017-12-23: qty 30

## 2017-12-23 MED ORDER — EPHEDRINE SULFATE 50 MG/ML IJ SOLN
INTRAMUSCULAR | Status: DC | PRN
  Administered 2017-12-23: 10 mg via INTRAVENOUS

## 2017-12-23 MED ORDER — GLYCOPYRROLATE 0.2 MG/ML IJ SOLN
INTRAMUSCULAR | Status: DC | PRN
  Administered 2017-12-23: 0.1 mg via INTRAVENOUS

## 2017-12-23 SURGICAL SUPPLY — 93 items
BANDAGE ACE 3X5.8 VEL STRL LF (GAUZE/BANDAGES/DRESSINGS) ×3 IMPLANT
BANDAGE ACE 4X5 VEL STRL LF (GAUZE/BANDAGES/DRESSINGS) ×3 IMPLANT
BANDAGE ACE 6X5 VEL STRL LF (GAUZE/BANDAGES/DRESSINGS) ×3 IMPLANT
BENZOIN TINCTURE PRP APPL 2/3 (GAUZE/BANDAGES/DRESSINGS) IMPLANT
BIOPATCH BLUE 3/4IN DISK W/1.5 (GAUZE/BANDAGES/DRESSINGS) ×3 IMPLANT
BIT DRILL 2.0 LNG QUCK RELEASE (BIT) ×1 IMPLANT
BIT DRILL 2.8 QUICK RELEASE (BIT) ×1 IMPLANT
BLADE AVERAGE 25MMX9MM (BLADE)
BLADE AVERAGE 25X9 (BLADE) IMPLANT
BLADE CLIPPER SURG (BLADE) ×3 IMPLANT
BLADE SURG 10 STRL SS (BLADE) ×3 IMPLANT
BNDG COHESIVE 4X5 TAN STRL (GAUZE/BANDAGES/DRESSINGS) ×3 IMPLANT
BNDG ESMARK 4X9 LF (GAUZE/BANDAGES/DRESSINGS) ×3 IMPLANT
BNDG GAUZE ELAST 4 BULKY (GAUZE/BANDAGES/DRESSINGS) ×3 IMPLANT
BRUSH SCRUB SURG 4.25 DISP (MISCELLANEOUS) ×6 IMPLANT
CLEANER TIP ELECTROSURG 2X2 (MISCELLANEOUS) ×3 IMPLANT
CLOSURE WOUND 1/2 X4 (GAUZE/BANDAGES/DRESSINGS)
COVER SURGICAL LIGHT HANDLE (MISCELLANEOUS) ×6 IMPLANT
CUFF TOURNIQUET SINGLE 18IN (TOURNIQUET CUFF) IMPLANT
CUFF TOURNIQUET SINGLE 24IN (TOURNIQUET CUFF) IMPLANT
DECANTER SPIKE VIAL GLASS SM (MISCELLANEOUS) IMPLANT
DRAPE C-ARM 42X72 X-RAY (DRAPES) ×3 IMPLANT
DRAPE C-ARMOR (DRAPES) ×3 IMPLANT
DRAPE INCISE IOBAN 66X45 STRL (DRAPES) IMPLANT
DRAPE U-SHAPE 47X51 STRL (DRAPES) ×3 IMPLANT
DRILL 2.0 LNG QUICK RELEASE (BIT) ×3
DRILL 2.8 QUICK RELEASE (BIT) ×3
DRSG ADAPTIC 3X8 NADH LF (GAUZE/BANDAGES/DRESSINGS) ×3 IMPLANT
DRSG EMULSION OIL 3X3 NADH (GAUZE/BANDAGES/DRESSINGS) IMPLANT
ELECT REM PT RETURN 9FT ADLT (ELECTROSURGICAL) ×3
ELECTRODE REM PT RTRN 9FT ADLT (ELECTROSURGICAL) ×1 IMPLANT
FACESHIELD WRAPAROUND (MASK) IMPLANT
GAUZE SPONGE 4X4 12PLY STRL (GAUZE/BANDAGES/DRESSINGS) ×3 IMPLANT
GAUZE XEROFORM 1X8 LF (GAUZE/BANDAGES/DRESSINGS) ×3 IMPLANT
GAUZE XEROFORM 5X9 LF (GAUZE/BANDAGES/DRESSINGS) ×3 IMPLANT
GLOVE BIO SURGEON STRL SZ7.5 (GLOVE) ×3 IMPLANT
GLOVE BIO SURGEON STRL SZ8 (GLOVE) ×3 IMPLANT
GLOVE BIOGEL M 6.5 STRL (GLOVE) ×3 IMPLANT
GLOVE BIOGEL PI IND STRL 7.5 (GLOVE) ×1 IMPLANT
GLOVE BIOGEL PI IND STRL 8 (GLOVE) ×1 IMPLANT
GLOVE BIOGEL PI INDICATOR 7.5 (GLOVE) ×2
GLOVE BIOGEL PI INDICATOR 8 (GLOVE) ×2
GOWN STRL REUS W/ TWL LRG LVL3 (GOWN DISPOSABLE) ×2 IMPLANT
GOWN STRL REUS W/ TWL XL LVL3 (GOWN DISPOSABLE) ×1 IMPLANT
GOWN STRL REUS W/TWL LRG LVL3 (GOWN DISPOSABLE) ×4
GOWN STRL REUS W/TWL XL LVL3 (GOWN DISPOSABLE) ×2
GUIDEWIRE ORTH 6X062XTROC NS (WIRE) ×1 IMPLANT
GUIDEWIRE ORTHO 2.0X9 ST (WIRE) ×3 IMPLANT
K-WIRE .062 (WIRE) ×2
KIT BASIN OR (CUSTOM PROCEDURE TRAY) ×3 IMPLANT
KIT TURNOVER KIT B (KITS) ×3 IMPLANT
MANIFOLD NEPTUNE II (INSTRUMENTS) IMPLANT
NEEDLE HYPO 25GX1X1/2 BEV (NEEDLE) ×3 IMPLANT
NS IRRIG 1000ML POUR BTL (IV SOLUTION) ×3 IMPLANT
PACK ORTHO EXTREMITY (CUSTOM PROCEDURE TRAY) ×3 IMPLANT
PAD ARMBOARD 7.5X6 YLW CONV (MISCELLANEOUS) ×6 IMPLANT
PAD CAST 3X4 CTTN HI CHSV (CAST SUPPLIES) ×1 IMPLANT
PAD CAST 4YDX4 CTTN HI CHSV (CAST SUPPLIES) ×1 IMPLANT
PADDING CAST COTTON 3X4 STRL (CAST SUPPLIES) ×2
PADDING CAST COTTON 4X4 STRL (CAST SUPPLIES) ×2
PLATE OLECRANON RT .3HOLE (Plate) ×3 IMPLANT
SCREW CORTICAL 3.5X20MM (Screw) ×3 IMPLANT
SCREW HEX LOCK 2.7X16MM (Screw) ×6 IMPLANT
SCREW HEXALOBE LOCKING 3.5X16M (Screw) ×3 IMPLANT
SCREW LOCK 24X3.5X HEXALOBE (Screw) ×1 IMPLANT
SCREW LOCK 40X3.5X HEXALOBE (Screw) ×1 IMPLANT
SCREW LOCKING 3.5X24 (Screw) ×2 IMPLANT
SCREW LOCKING 3.5X40 (Screw) ×2 IMPLANT
SCREW NON LOCK 3.5X40 (Screw) ×3 IMPLANT
SCREW NON LOCKING HEX 3.5X18MM (Screw) ×3 IMPLANT
SPLINT PLASTER CAST XFAST 5X30 (CAST SUPPLIES) ×1 IMPLANT
SPLINT PLASTER XFAST SET 5X30 (CAST SUPPLIES) ×2
SPONGE LAP 18X18 X RAY DECT (DISPOSABLE) ×6 IMPLANT
STAPLER VISISTAT 35W (STAPLE) ×3 IMPLANT
STOCKINETTE IMPERVIOUS 9X36 MD (GAUZE/BANDAGES/DRESSINGS) ×3 IMPLANT
STRIP CLOSURE SKIN 1/2X4 (GAUZE/BANDAGES/DRESSINGS) IMPLANT
SUCTION FRAZIER HANDLE 10FR (MISCELLANEOUS) ×2
SUCTION TUBE FRAZIER 10FR DISP (MISCELLANEOUS) ×1 IMPLANT
SUT PDS AB 2-0 CT1 27 (SUTURE) IMPLANT
SUT PROLENE 3 0 PS 2 (SUTURE) IMPLANT
SUT VIC AB 0 CT1 27 (SUTURE)
SUT VIC AB 0 CT1 27XBRD ANBCTR (SUTURE) IMPLANT
SUT VIC AB 2-0 CT1 27 (SUTURE) ×2
SUT VIC AB 2-0 CT1 TAPERPNT 27 (SUTURE) ×1 IMPLANT
SUT VIC AB 2-0 CT3 27 (SUTURE) IMPLANT
SYR CONTROL 10ML LL (SYRINGE) ×3 IMPLANT
TOWEL OR 17X24 6PK STRL BLUE (TOWEL DISPOSABLE) ×3 IMPLANT
TOWEL OR 17X26 10 PK STRL BLUE (TOWEL DISPOSABLE) ×6 IMPLANT
TUBE CONNECTING 12'X1/4 (SUCTIONS) ×1
TUBE CONNECTING 12X1/4 (SUCTIONS) ×2 IMPLANT
UNDERPAD 30X30 (UNDERPADS AND DIAPERS) ×3 IMPLANT
WATER STERILE IRR 1000ML POUR (IV SOLUTION) ×3 IMPLANT
YANKAUER SUCT BULB TIP NO VENT (SUCTIONS) ×3 IMPLANT

## 2017-12-23 NOTE — Anesthesia Preprocedure Evaluation (Signed)
Anesthesia Evaluation  Patient identified by MRN, date of birth, ID band Patient awake    Reviewed: Allergy & Precautions, NPO status , Patient's Chart, lab work & pertinent test results  Airway Mallampati: II  TM Distance: >3 FB Neck ROM: Full    Dental no notable dental hx.    Pulmonary neg pulmonary ROS, former smoker,    Pulmonary exam normal breath sounds clear to auscultation       Cardiovascular hypertension, Normal cardiovascular exam Rhythm:Regular Rate:Normal     Neuro/Psych Dementia negative neurological ROS     GI/Hepatic negative GI ROS, Neg liver ROS,   Endo/Other  Hypothyroidism   Renal/GU negative Renal ROS  negative genitourinary   Musculoskeletal negative musculoskeletal ROS (+)   Abdominal   Peds negative pediatric ROS (+)  Hematology negative hematology ROS (+)   Anesthesia Other Findings   Reproductive/Obstetrics negative OB ROS                             Anesthesia Physical Anesthesia Plan  ASA: III  Anesthesia Plan: General   Post-op Pain Management:    Induction: Intravenous  PONV Risk Score and Plan: 2 and Ondansetron, Dexamethasone and Treatment may vary due to age or medical condition  Airway Management Planned: LMA and Oral ETT  Additional Equipment:   Intra-op Plan:   Post-operative Plan: Extubation in OR  Informed Consent: I have reviewed the patients History and Physical, chart, labs and discussed the procedure including the risks, benefits and alternatives for the proposed anesthesia with the patient or authorized representative who has indicated his/her understanding and acceptance.   Dental advisory given  Plan Discussed with: CRNA and Surgeon  Anesthesia Plan Comments: (If dementia is mild, will try nerve block with MAC)        Anesthesia Quick Evaluation

## 2017-12-23 NOTE — Anesthesia Postprocedure Evaluation (Signed)
Anesthesia Post Note  Patient: Brianna Reynolds  Procedure(s) Performed: OPEN REDUCTION INTERNAL FIXATION (ORIF) ELBOW/OLECRANON FRACTURE (Right Elbow)     Patient location during evaluation: PACU Anesthesia Type: General Level of consciousness: awake and alert Pain management: pain level controlled Vital Signs Assessment: post-procedure vital signs reviewed and stable Respiratory status: spontaneous breathing, nonlabored ventilation, respiratory function stable and patient connected to nasal cannula oxygen Cardiovascular status: blood pressure returned to baseline and stable Postop Assessment: no apparent nausea or vomiting Anesthetic complications: no    Last Vitals:  Vitals:   12/23/17 1545 12/23/17 1601  BP:  (!) 177/82  Pulse: 88 84  Resp: (!) 23 (!) 22  Temp:  36.5 C  SpO2: 96% 91%    Last Pain:  Vitals:   12/23/17 1601  TempSrc: Oral  PainSc:                  Suha Schoenbeck S

## 2017-12-23 NOTE — Plan of Care (Signed)
  Problem: Nutrition: Goal: Adequate nutrition will be maintained Outcome: Progressing   Problem: Coping: Goal: Level of anxiety will decrease Outcome: Progressing   Problem: Elimination: Goal: Will not experience complications related to urinary retention Outcome: Progressing   Problem: Pain Managment: Goal: General experience of comfort will improve Outcome: Progressing   

## 2017-12-23 NOTE — Op Note (Signed)
NAME: Brianna Reynolds, Brianna Reynolds MEDICAL RECORD ZO:[id number] ACCOUNT [id number] DATE OF BIRTH:October 16, 1923 FACILITY: [facility] LOCATION: [facility] PHYSICIAN:Orel Hord H. Bethany Hirt, MD  OPERATIVE REPORT  DATE OF PROCEDURE:  12/23/2017  PREOPERATIVE DIAGNOSIS:  Right olecranon fracture.  POSTOPERATIVE DIAGNOSIS:  Right olecranon fracture.  PROCEDURE:  Open reduction, internal fixation of right olecranon fracture.  SURGEON:  Myrene Galas, MD  ASSISTANT:  None.  ANESTHESIA:  General.  COMPLICATIONS:  None.  TOURNIQUET:  None.  DISPOSITION:  To PACU.  CONDITION: Stable.  BRIEF INDICATIONS:  The patient is a 84 year old female who sustained a displaced right olecranon fracture in a fall.  She complained of pain and loss of active extension of the elbow and was placed into a splint.  I discussed with the patient and family  the risks and benefits of surgical repair, including the potential for symptomatic hardware, loss of fixation, infection, nerve injury, vessel injury, DVT, PE, heart attack, stroke, and loss of motion, among others, and after full discussion, consent  was given to proceed.  BRIEF SUMMARY OF PROCEDURE:  The patient was taken to the operating room where general anesthesia was induced.  The patient was then positioned with the injured side up and an axillary roll and all prominences padded appropriately.  Standard  chlorhexidine wash and then Betadine scrub and paint were performed.  A timeout was held and then we proceeded with a posterior incision just off the midline.  Dissection was carried carefully down to the periosteum, which was left in place.  Hemostasis  was obtained with electrocautery.   Weitlaner retractors were placed to assist with retraction and my assistant also used Government social research officer.  The fracture site was then exposed and hematoma removed from the bone surfaces with irrigation and  curettage.  This was followed by placement of a drill hole within the distal  segment that enabled me to place the large Weber tenaculum into this and the proximal extent up on the tip of the olecranon with the help of another tenaculum.  The proximal  segment was mobilized distally.  I prepared the fracture edges by elevating for 2 mm with a 15 blade so that I could see the fracture and watch it interdigitate.  Once this was achieved, it was held in position with the tenaculum and supplemented with  another crossing tenaculum to further compress and interdigitate.  I then placed the spiked plate from [employer] and I pushed it as distal as possible on the proximal segment and secured it with a K-wire through the so-called home run screw within the  plate.  I followed with placement of a single screw for compression in the oblong hole and then used a combination of small locking screws proximally and then a standard lag screw in the home run screw hole to further compress the fracture and oppose the  plate to the bone.  Once this provisional reduction was obtained, I checked AP, lateral, and oblique images with fluoroscopy to confirm reduction and hardware placement.  I then followed with placement of two additional lag screws distally.  Proximally,  I then exchanged the standard screw for a locked screw.  Final images were obtained showing appropriate screw position, trajectory and length.  Wounds were irrigated thoroughly and closed in standard layered fashion using 2-0 Vicryl for the deep layer,  2-0 Vicryl for the subcutaneous and a 2-0 horizontal mattress for the skin.  A sterile gently compressive dressing was applied and then a posterior splint with side struts at 90  degrees.  The patient was then taken to the PACU in stable condition.  There  were no complications during the procedure.  PROGNOSIS:  The patient will be allowed full range of motion actively and passively of the digits and wrist and will be able to continue with passive motion of the elbow in extension and avoiding  active extension against resistance while performing a  passive and active assisted flexion.  Because of her dementia, she is at increased risk of complications which would include loss of fixation.  AN/NUANCE  D:12/23/2017 T:12/23/2017 JOB:[id number]

## 2017-12-23 NOTE — Progress Notes (Signed)
PROGRESS NOTE    Brianna Reynolds  YNW:[id number] DOB: May 08, 1924 DOA: 12/22/2017 PCP: Sherren Mocha, MD  Brief Narrative: 85 y.o. female with hx of CAD sp stent, Cdif and dementia fell walking in a parking lot with her walker today and came to ED where work-up shows R elbow fracture.  Has been seen by orthopedics who are considering surgery tomorrow.  Asked to admit to medical service.    Pt is not good historian with severe dementia family is all here and answering quetsions, patient smoked but quit 50 yrs ago no hx of lung disease asthma use of nebs or inhalers and no cardiac hx not hx stents or heart attack or chf.      Chart:  Aug 2015 - acute TME/ confusion, high LA level 6.5, similar episodes in past w/u negative prob infection w low grade temp  NOv 2015 - Cdif infection recurrent , HTN, low K, dehydration , depression  May 2018 - fall with R hip fracutre undewent ORIF     Assessment & Plan:   Principal Problem:   Olecranon fracture Active Problems:   Fall at home, initial encounter   Dementia   Depression   Elbow fracture  1] status post mechanical fall with right elbow fracture patient going for surgery today.  She lives at home with this with her family.  2] history of dementia stable at this time.  3] hypothyroidism continue Synthroid.    DVT prophylaxis: Heparin Code Status: Full code Family Communication: No family available Disposition Plan: TBD Consultants: Ortho  Procedures: None Antimicrobials: None  Subjective: Resting in bed waiting for surgery Objective: Vitals:   12/22/17 1646 12/22/17 1738 12/22/17 2144 12/23/17 0605  BP:  (!) 155/54 (!) 130/55 (!) 142/60  Pulse: 61 (!) 55 (!) 59 61  Resp:  16 16 (!) 26  Temp:  98.4 F (36.9 C) 98.2 F (36.8 C) 98.2 F (36.8 C)  TempSrc:  Oral Oral Oral  SpO2: 96% 99% 97% 94%  Weight:  60.9 kg (134 lb 4.2 oz)  63 kg (139 lb)  Height:   (1.499 m)      Intake/Output Summary (Last 24 hours) at  12/23/2017 0931 Last data filed at 12/23/2017 0600 Gross per 24 hour  Intake 936.67 ml  Output -  Net 936.67 ml   Filed Weights   12/22/17 1738 12/23/17 0605  Weight: 60.9 kg (134 lb 4.2 oz) 63 kg (139 lb)    Examination:  General exam: Appears calm and comfortable  Respiratory system: Clear to auscultation. Respiratory effort normal. Cardiovascular system: S1 & S2 heard, RRR. No JVD, murmurs, rubs, gallops or clicks. No pedal edema. Gastrointestinal system: Abdomen is nondistended, soft and nontender. No organomegaly or masses felt. Normal bowel sounds heard. Central nervous system: Alert and oriented. No focal neurological deficits. Extremities:RUE covered with dressings. Skin: No rashes, lesions or ulcers Psychiatry: Judgement and insight appear normal. Mood & affect appropriate.     Data Reviewed: I have personally reviewed following labs and imaging studies  CBC: Recent Labs  Lab 12/22/17 1326  WBC 9.4  HGB 12.5  HCT 39.6  MCV 94.3  PLT 245   Basic Metabolic Panel: Recent Labs  Lab 12/22/17 1326  NA 143  K 3.8  CL 109  CO2 25  GLUCOSE 128*  BUN 15  CREATININE 0.98  CALCIUM 9.7   GFR: Estimated Creatinine Clearance: 30.2 mL/min (by C-G formula based on SCr of 0.98 mg/dL). Liver Function Tests: Recent Labs  Lab 12/22/17 1326  AST 16  ALT 9*  ALKPHOS 52  BILITOT 0.8  PROT 6.4*  ALBUMIN 3.4*   No results for input(s): LIPASE, AMYLASE in the last 168 hours. No results for input(s): AMMONIA in the last 168 hours. Coagulation Profile: No results for input(s): INR, PROTIME in the last 168 hours. Cardiac Enzymes: No results for input(s): CKTOTAL, CKMB, CKMBINDEX, TROPONINI in the last 168 hours. BNP (last 3 results) No results for input(s): PROBNP in the last 8760 hours. HbA1C: No results for input(s): HGBA1C in the last 72 hours. CBG: No results for input(s): GLUCAP in the last 168 hours. Lipid Profile: No results for input(s): CHOL, HDL,  LDLCALC, TRIG, CHOLHDL, LDLDIRECT in the last 72 hours. Thyroid Function Tests: No results for input(s): TSH, T4TOTAL, FREET4, T3FREE, THYROIDAB in the last 72 hours. Anemia Panel: No results for input(s): VITAMINB12, FOLATE, FERRITIN, TIBC, IRON, RETICCTPCT in the last 72 hours. Sepsis Labs: No results for input(s): PROCALCITON, LATICACIDVEN in the last 168 hours.  Recent Results (from the past 240 hour(s))  Surgical pcr screen     Status: None   Collection Time: 12/22/17  6:18 PM  Result Value Ref Range Status   MRSA, PCR NEGATIVE NEGATIVE Final   Staphylococcus aureus NEGATIVE NEGATIVE Final    Comment: (NOTE) The Xpert SA Assay (FDA approved for NASAL specimens in patients 57 years of age and older), is one component of a comprehensive surveillance program. It is not intended to diagnose infection nor to guide or monitor treatment. Performed at [facility], [address], [place], [place] [address]          Radiology Studies: Dg Chest 2 View  Result Date: 12/22/2017 CLINICAL DATA:  Wheezing. EXAM: CHEST - 2 VIEW COMPARISON:  No prior. FINDINGS: Mediastinum and hilar structures normal. Stable cardiomegaly with normal pulmonary vascularity. Low lung volumes with mild basilar atelectasis/infiltrates. IMPRESSION: 1.  Low lung volumes with mild basilar atelectasis/infiltrates. 2.  Stable cardiomegaly.  No pulmonary venous congestion. 3.  Sliding hiatal hernia. Electronically Signed   By: Maisie Fus  Register   On: 12/22/2017 15:09   Dg Elbow Complete Right (3+view)  Result Date: 12/22/2017 CLINICAL DATA:  Recent fall with right posterior elbow pain EXAM: RIGHT ELBOW - COMPLETE 3+ VIEW COMPARISON:  None. FINDINGS: Splinting material is noted somewhat precluding fine bony detail and the ability to fully straighten the arm. A olecranon process fracture extending into the articular surface is noted with distraction of approximately the 11 mm. There also changes suspicious for radial  head fracture although incompletely evaluated cortical fracture is noted in the distal humerus as well. IMPRESSION: Multiple fractures about the elbow. If further imaging is necessary CT may be the best option for fine bony detail. Electronically Signed   By: Alcide Clever M.D.   On: 12/22/2017 14:15        Scheduled Meds: . aspirin EC  81 mg Oral Daily  . cholecalciferol  2,000 Units Oral Daily  . levothyroxine  88 mcg Oral QAC breakfast  . multivitamin  1 tablet Oral Daily  . oxybutynin  15 mg Oral QHS  . sertraline  100 mg Oral Daily   Continuous Infusions: . sodium chloride 50 mL/hr at 12/22/17 1852  . clindamycin (CLEOCIN) IV       LOS: 1 day     Alwyn Ren, MD [facility]  If 7PM-7AM, please contact night-coverage [email or web address] Password [facility] 12/23/2017, 9:31 AM

## 2017-12-23 NOTE — Anesthesia Procedure Notes (Signed)
Anesthesia Procedure Image    

## 2017-12-23 NOTE — Brief Op Note (Signed)
12/22/2017 - 12/23/2017  3:11 PM  PATIENT:  Brianna Reynolds  84 y.o. female  PRE-OPERATIVE DIAGNOSIS:  Right olecranon fx  POST-OPERATIVE DIAGNOSIS:  Right olecranon fx  PROCEDURE:  Procedure(s): OPEN REDUCTION INTERNAL FIXATION (ORIF) ELBOW/OLECRANON FRACTURE (Right)  SURGEON:  Surgeon(s) and Role:    Myrene Galas, MD - Primary  ASSISTANTS: none   ANESTHESIA:   none  EBL:  50cc  BLOOD ADMINISTERED:none  DRAINS: none   LOCAL MEDICATIONS USED:  NONE  SPECIMEN:  No Specimen  DISPOSITION OF SPECIMEN:  N/A  COUNTS:  YES  TOURNIQUET:  * No tourniquets in log *  DICTATION: .Other Dictation: Dictation Number [id number]  PLAN OF CARE: Admit to inpatient   PATIENT DISPOSITION:  PACU - hemodynamically stable.   Delay start of Pharmacological VTE agent (>24hrs) due to surgical blood loss or risk of bleeding: no

## 2017-12-23 NOTE — Transfer of Care (Signed)
Immediate Anesthesia Transfer of Care Note  Patient: Brianna Reynolds  Procedure(s) Performed: OPEN REDUCTION INTERNAL FIXATION (ORIF) ELBOW/OLECRANON FRACTURE (Right Elbow)  Patient Location: PACU  Anesthesia Type:GA combined with regional for post-op pain  Level of Consciousness: awake, cooperative.  Denies pain.  Airway & Oxygen Therapy: Patient Spontanous Breathing and Patient connected to nasal cannula oxygen  Post-op Assessment: Report given to RN and Post -op Vital signs reviewed and stable  Post vital signs: Reviewed and stable  Last Vitals:  Vitals Value Taken Time  BP 166/75 12/23/2017  3:19 PM  Temp    Pulse 98 12/23/2017  3:20 PM  Resp 34 12/23/2017  3:20 PM  SpO2 90 % 12/23/2017  3:20 PM  Vitals shown include unvalidated device data.  Last Pain:  Vitals:   12/23/17 0949  TempSrc: Oral  PainSc:       Patients Stated Pain Goal: 4 (12/22/17 2237)  Complications: No apparent anesthesia complications

## 2017-12-23 NOTE — Anesthesia Procedure Notes (Signed)
Anesthesia Regional Block: Supraclavicular block   Pre-Anesthetic Checklist: ,, timeout performed, Correct Patient, Correct Site, Correct Laterality, Correct Procedure, Correct Position, site marked, Risks and benefits discussed,  Surgical consent,  Pre-op evaluation,  At surgeon's request and post-op pain management  Laterality: Right  Prep: chloraprep       Needles:  Injection technique: Single-shot  Needle Type: Echogenic Needle     Needle Length: 9cm      Additional Needles:   Procedures:,,,, ultrasound used (permanent image in chart),,,,  Narrative:  Start time: 12/23/2017 11:12 AM End time: 12/23/2017 11:21 AM Injection made incrementally with aspirations every 5 mL.  Performed by: Personally  Anesthesiologist: Eilene Ghazi, MD  Additional Notes: Patient tolerated the procedure well without complications

## 2017-12-23 NOTE — Anesthesia Procedure Notes (Signed)
Procedure Name: Intubation Date/Time: 12/23/2017 1:21 PM Performed by: Wilder Glade, CRNA Pre-anesthesia Checklist: Patient identified, Emergency Drugs available, Suction available, Patient being monitored and Timeout performed Patient Re-evaluated:Patient Re-evaluated prior to induction Oxygen Delivery Method: Circle system utilized Preoxygenation: Pre-oxygenation with 100% oxygen Induction Type: IV induction Ventilation: Mask ventilation without difficulty Laryngoscope Size: Miller and 2 Grade View: Grade I Tube type: Oral Tube size: 7.0 mm Number of attempts: 1 (brief atraumatic ) Airway Equipment and Method: Stylet Placement Confirmation: ETT inserted through vocal cords under direct vision,  positive ETCO2 and breath sounds checked- equal and bilateral Secured at: 22 cm Tube secured with: Tape Dental Injury: Teeth and Oropharynx as per pre-operative assessment

## 2017-12-24 ENCOUNTER — Encounter (HOSPITAL_COMMUNITY): Payer: Self-pay | Admitting: Orthopedic Surgery

## 2017-12-24 LAB — URINALYSIS, ROUTINE W REFLEX MICROSCOPIC
BACTERIA UA: NONE SEEN
Bilirubin Urine: NEGATIVE
Glucose, UA: NEGATIVE mg/dL
Ketones, ur: NEGATIVE mg/dL
Leukocytes, UA: NEGATIVE
Nitrite: NEGATIVE
PROTEIN: NEGATIVE mg/dL
Specific Gravity, Urine: 1.013 (ref 1.005–1.030)
pH: 5 (ref 5.0–8.0)

## 2017-12-24 LAB — MAGNESIUM: MAGNESIUM: 1.7 mg/dL (ref 1.7–2.4)

## 2017-12-24 LAB — PHOSPHORUS: PHOSPHORUS: 3.1 mg/dL (ref 2.5–4.6)

## 2017-12-24 MED ORDER — ACETAMINOPHEN 500 MG PO TABS
1000.0000 mg | ORAL_TABLET | Freq: Four times a day (QID) | ORAL | Status: DC
  Administered 2017-12-24 – 2017-12-25 (×4): 1000 mg via ORAL
  Filled 2017-12-24 (×5): qty 2

## 2017-12-24 MED ORDER — TRAMADOL HCL 50 MG PO TABS: 50.0000 mg | ORAL_TABLET | Freq: Three times a day (TID) | ORAL | 0 refills | Status: AC | PRN

## 2017-12-24 MED ORDER — ACETAMINOPHEN 500 MG PO TABS: 1000.0000 mg | ORAL_TABLET | Freq: Four times a day (QID) | ORAL | 0 refills | Status: AC

## 2017-12-24 MED ORDER — TRAMADOL HCL 50 MG PO TABS
50.0000 mg | ORAL_TABLET | Freq: Three times a day (TID) | ORAL | Status: DC | PRN
  Administered 2017-12-24 – 2017-12-25 (×3): 50 mg via ORAL
  Filled 2017-12-24 (×3): qty 1

## 2017-12-24 NOTE — Evaluation (Signed)
Occupational Therapy Evaluation Patient Details Name: Brianna Reynolds MRN: [id number] DOB: [birth year] Today's Date: 12/24/2017    History of Present Illness 84 yo female presenting with mechanical fall while walking in parking low resulting in right olecranon fracture. S/p ORIF right olecranon fx on 12/23/17. PHM including right hip fx (May 2018), depression, CAD sp stent, Cdif, and dementia.   Clinical Impression   PTA, pt was living with her husband who assisted with BADLs due to baseline dementia. Pt using a RW during functional mobility PTA. Family very supportive. Pt currently requiring Max A for UB ADLs, Mod A for LB ADLs, and Min A +2 for functional mobility with single hand held A. Providing family with education on edema management through finger ROM and elevation. Pt will require further acute OT to facilitate safe dc and increase safety with ADLs. Recommend post-acute rehab to optimize safety and independence with ADLs and functional mobility as well as decrease fall risk. However, due to baseline dementia, pt would benefit from dc to home environment to prevent decrease in cognition and functional performance; pt would benefit from program such as [facility] if eligable for skilled level care at home.    Follow Up Recommendations  SNF;Supervision/Assistance - 24 hour(Would benefit from [facility] program due to baseline dementia)    Equipment Recommendations  None recommended by OT    Recommendations for Other Services PT consult     Precautions / Restrictions Precautions Precautions: Fall Restrictions Weight Bearing Restrictions: Yes RUE Weight Bearing: Weight bear through elbow only Other Position/Activity Restrictions: Verbal order per PA for weight bearing through elbow only.       Mobility Bed Mobility Overal bed mobility: Needs Assistance Bed Mobility: Supine to Sit     Supine to sit: Min assist     General bed mobility comments: Min A for facilitating legs  towards EOB and elevating trunk  Transfers Overall transfer level: Needs assistance Equipment used: 1 person hand held assist Transfers: Sit to/from Stand Sit to Stand: Min assist;+2 safety/equipment         General transfer comment: Min A for power up into standing and single hand held A on LUE. +2 for safety. Noting slight posterior lean during standing which pt corrects with tactile cues.    Balance Overall balance assessment: Needs assistance Sitting-balance support: Feet supported Sitting balance-Leahy Scale: Fair Sitting balance - Comments: Able to bend forward to adjust socks at EOB without LOB   Standing balance support: Single extremity supported;During functional activity Standing balance-Leahy Scale: Poor Standing balance comment: reliant on single UE support for balance. Noted posterior lean                           ADL either performed or assessed with clinical judgement   ADL Overall ADL's : Needs assistance/impaired Eating/Feeding: Set up;Sitting;Moderate assistance Eating/Feeding Details (indicate cue type and reason): Mod A for bilateral tasks Grooming: Cueing for sequencing;Sitting;Moderate assistance Grooming Details (indicate cue type and reason): Mod A for bilateral tasks Upper Body Bathing: Maximal assistance;Sitting   Lower Body Bathing: Moderate assistance;Sit to/from stand   Upper Body Dressing : Maximal assistance;Sitting   Lower Body Dressing: Moderate assistance;Sit to/from stand Lower Body Dressing Details (indicate cue type and reason): Pt able to reach forward and adjust socks with LUE. Pt requires Mod A for dynamic balance in standing Toilet Transfer: Minimal assistance;Ambulation;+2 for safety/equipment(Simulated to recliner)           Functional  mobility during ADLs: Moderate assistance;+2 for safety/equipment General ADL Comments: Pt with decreased funcitonal performance due to limited ROM and movement of dominant UE  (right). Pt with decreased ST memory and requiring cues for adherance to NWB status. At nest session, will need to address ADLs with use of plateform walker      Vision         Perception     Praxis      Pertinent Vitals/Pain Pain Assessment: Faces Faces Pain Scale: Hurts even more Pain Location: RUE movement Pain Descriptors / Indicators: Discomfort;Grimacing;Shooting Pain Intervention(s): Monitored during session;Limited activity within patient's tolerance;Repositioned     Hand Dominance Right   Extremity/Trunk Assessment Upper Extremity Assessment Upper Extremity Assessment: RUE deficits/detail RUE Deficits / Details: Right olecranon fracture. WFL for finger opposition and finger ext/flex. Casted at wrist and elbow restricting movement. Restricted from active elbow extension and extension against resistance x 6-8 wks.. Limited shoulder ROM due to pain. RUE: Unable to fully assess due to pain;Unable to fully assess due to immobilization RUE Coordination: decreased fine motor;decreased gross motor   Lower Extremity Assessment Lower Extremity Assessment: Defer to PT evaluation   Cervical / Trunk Assessment Cervical / Trunk Assessment: Kyphotic   Communication Communication Communication: No difficulties   Cognition Arousal/Alertness: Awake/alert Behavior During Therapy: WFL for tasks assessed/performed Overall Cognitive Status: History of cognitive impairments - at baseline                                 General Comments: Baseline dementia   General Comments  Daughters and husband present throughout session    Exercises     Shoulder Instructions      Home Living Family/patient expects to be discharged to:: Private residence Living Arrangements: Spouse/significant other Available Help at Discharge: Family;Available 24 hours/day(Daughter comes in and out) Type of Home: House Home Access: Stairs to enter [employer] of Steps: 5   Home  Layout: Two level;Able to live on main level with bedroom/bathroom     Bathroom Shower/Tub: Tub/shower unit(Sponge baths at sink)   Firefighter: Standard(BSC over toilet)     Home Equipment: Bedside commode;Walker - 2 wheels          Prior Functioning/Environment Level of Independence: Needs assistance  Gait / Transfers Assistance Needed: Uses RW for functional mobility around home ADL's / Homemaking Assistance Needed: Husband assist with BADLs including bathing and dressing            OT Problem List: Decreased strength;Decreased range of motion;Decreased activity tolerance;Impaired balance (sitting and/or standing);Decreased knowledge of use of DME or AE;Decreased knowledge of precautions;Decreased safety awareness;Decreased cognition;Pain;Impaired UE functional use      OT Treatment/Interventions: Self-care/ADL training;Therapeutic exercise;Energy conservation;DME and/or AE instruction;Therapeutic activities;Patient/family education    OT Goals(Current goals can be found in the care plan section) Acute Rehab OT Goals Patient Stated Goal: Family wants her walking safety and prevent falls. OT Goal Formulation: With patient/family Time For Goal Achievement: 01/07/18 Potential to Achieve Goals: Good  OT Frequency: Min 2X/week   Barriers to D/C:            Co-evaluation PT/OT/SLP Co-Evaluation/Treatment: Yes Reason for Co-Treatment: To address functional/ADL transfers   OT goals addressed during session: ADL's and self-care      AM-PAC PT "6 Clicks" Daily Activity     Outcome Measure Help from another person eating meals?: A Little Help from another person taking care of personal grooming?:  A Little Help from another person toileting, which includes using toliet, bedpan, or urinal?: A Lot Help from another person bathing (including washing, rinsing, drying)?: A Lot Help from another person to put on and taking off regular upper body clothing?: A Lot Help from  another person to put on and taking off regular lower body clothing?: A Lot 6 Click Score: 14   End of Session Equipment Utilized During Treatment: Gait belt Nurse Communication: Mobility status;Weight bearing status  Activity Tolerance: Patient tolerated treatment well Patient left: in chair;with call bell/phone within reach;with chair alarm set;with family/visitor present  OT Visit Diagnosis: Unsteadiness on feet (R26.81);Other abnormalities of gait and mobility (R26.89);Muscle weakness (generalized) (M62.81);Pain Pain - Right/Left: Right Pain - part of body: Arm                Time: 6387-5643 OT Time Calculation (min): 26 min Charges:  OT General Charges $OT Visit: 1 Visit OT Evaluation $OT Eval Moderate Complexity: 1 Mod G-Codes:     Junell Cullifer MSOT, OTR/L Acute Rehab Pager: [phone] Office: [phone]  Theodoro Grist Donley Harland 12/24/2017, 2:20 PM

## 2017-12-24 NOTE — Evaluation (Signed)
Physical Therapy Evaluation Patient Details Name: Brianna Reynolds MRN: [id number] DOB: 1923/09/16 Today's Date: 12/24/2017   History of Present Illness  84 yo female presenting with mechanical fall while walking in parking low resulting in right olecranon fracture. S/p ORIF right olecranon fx on 12/23/17. PHM including right hip fx (May 2018), depression, CAD sp stent, Cdif, and dementia.  Clinical Impression  Pt admitted with above diagnosis. Pt currently with functional limitations due to the deficits listed below (see PT Problem List). Patient presenting with decreased functional mobility secondary to RUE pain and nonweightbearing status. Per PA, patient is weightbearing as tolerated through elbow only and okay to use platform RW for ambulation. On PT evaluation, patient requiring minA + 2 for ambulation around room using single hand held assist. Will need platform RW to assess gait for next session. Patient has excellent family support. Due to baseline dementia, recommending "Home First" program in order to discharge to home environment with follow up physical therapy.  Pt will benefit from skilled PT to increase their independence and safety with mobility to allow discharge to the venue listed below.       Follow Up Recommendations Home health PT;Other (comment)(Home First)    Equipment Recommendations  Other (comment)(Rolling walker with 5" wheels and RUE platform)    Recommendations for Other Services       Precautions / Restrictions Precautions Precautions: Fall Restrictions Weight Bearing Restrictions: Yes RUE Weight Bearing: Weight bear through elbow only Other Position/Activity Restrictions: Verbal order per PA for weight bearing through elbow only.       Mobility  Bed Mobility Overal bed mobility: Needs Assistance Bed Mobility: Supine to Sit     Supine to sit: Min assist     General bed mobility comments: Min A for facilitating legs towards EOB and elevating  trunk  Transfers Overall transfer level: Needs assistance Equipment used: 1 person hand held assist Transfers: Sit to/from Stand Sit to Stand: Min assist;+2 safety/equipment         General transfer comment: Min A for power up into standing and single hand held A on LUE. +2 for safety. Noting slight posterior lean during standing which pt corrects with tactile cues.  Ambulation/Gait Ambulation/Gait assistance: Min guard Ambulation Distance (Feet): 15 Feet Assistive device: 1 person hand held assist Gait Pattern/deviations: Step-through pattern;Decreased stride length;Shuffle     General Gait Details: close min guard for safety. Patient with short, shuffling steps which patient's family states is baseline. Patient family reports patient is faster with the RW  Stairs            Wheelchair Mobility    Modified Rankin (Stroke Patients Only)       Balance Overall balance assessment: Needs assistance Sitting-balance support: Feet supported Sitting balance-Leahy Scale: Fair Sitting balance - Comments: Able to bend forward to adjust socks at EOB without LOB   Standing balance support: Single extremity supported;During functional activity Standing balance-Leahy Scale: Poor Standing balance comment: reliant on single UE support for balance. Noted posterior lean                             Pertinent Vitals/Pain Pain Assessment: Faces Faces Pain Scale: Hurts even more Pain Location: RUE movement Pain Descriptors / Indicators: Discomfort;Grimacing;Shooting Pain Intervention(s): Limited activity within patient's tolerance;Monitored during session;Repositioned    Home Living Family/patient expects to be discharged to:: Private residence Living Arrangements: Spouse/significant other Available Help at Discharge: Family;Available 24 hours/day(Daughter comes  in and out) Type of Home: House Home Access: Stairs to enter Entrance Stairs-Rails: Conservation officer, historic buildings of Steps: 5 Home Layout: Two level;Able to live on main level with bedroom/bathroom Home Equipment: Bedside commode;Walker - 2 wheels      Prior Function Level of Independence: Needs assistance   Gait / Transfers Assistance Needed: Uses RW for functional mobility around home  ADL's / Homemaking Assistance Needed: Husband assist with BADLs including bathing and dressing        Hand Dominance   Dominant Hand: Right    Extremity/Trunk Assessment   Upper Extremity Assessment Upper Extremity Assessment: RUE deficits/detail;Defer to OT evaluation RUE Deficits / Details: Right olecranon fracture.     Lower Extremity Assessment Lower Extremity Assessment: Overall WFL for tasks assessed    Cervical / Trunk Assessment Cervical / Trunk Assessment: Kyphotic  Communication   Communication: No difficulties  Cognition Arousal/Alertness: Awake/alert Behavior During Therapy: WFL for tasks assessed/performed Overall Cognitive Status: History of cognitive impairments - at baseline                                 General Comments: Baseline dementia      General Comments General comments (skin integrity, edema, etc.): daughters and husband present throughout session    Exercises     Assessment/Plan    PT Assessment Patient needs continued PT services  PT Problem List Decreased strength;Decreased range of motion;Decreased activity tolerance;Decreased balance;Decreased mobility       PT Treatment Interventions DME instruction;Gait training;Stair training;Functional mobility training;Therapeutic activities;Therapeutic exercise;Balance training;Patient/family education    PT Goals (Current goals can be found in the Care Plan section)  Acute Rehab PT Goals Patient Stated Goal: Family wants her walking safety and prevent falls. PT Goal Formulation: With patient/family Time For Goal Achievement: 01/07/18 Potential to Achieve Goals: Good    Frequency Min  5X/week   Barriers to discharge        Co-evaluation PT/OT/SLP Co-Evaluation/Treatment: Yes Reason for Co-Treatment: To address functional/ADL transfers           AM-PAC PT "6 Clicks" Daily Activity  Outcome Measure Difficulty turning over in bed (including adjusting bedclothes, sheets and blankets)?: A Little Difficulty moving from lying on back to sitting on the side of the bed? : Unable Difficulty sitting down on and standing up from a chair with arms (e.g., wheelchair, bedside commode, etc,.)?: A Little Help needed moving to and from a bed to chair (including a wheelchair)?: A Little Help needed walking in hospital room?: A Little Help needed climbing 3-5 steps with a railing? : A Little 6 Click Score: 16    End of Session Equipment Utilized During Treatment: Gait belt Activity Tolerance: Patient tolerated treatment well Patient left: in chair;with call bell/phone within reach;with chair alarm set;with family/visitor present Nurse Communication: Mobility status PT Visit Diagnosis: Unsteadiness on feet (R26.81);Difficulty in walking, not elsewhere classified (R26.2)    Time: 1610-9604 PT Time Calculation (min) (ACUTE ONLY): 26 min   Charges:   PT Evaluation $PT Eval Moderate Complexity: 1 Mod     PT G Codes:        Laurina Bustle, PT, DPT Acute Rehabilitation Services  Pager: [phone]   Vanetta Mulders 12/24/2017, 4:00 PM

## 2017-12-24 NOTE — Progress Notes (Signed)
Orthopedic Trauma Service Progress Note   Patient ID: ALEXXA SABET MRN: [id number] DOB/AGE: 1923-09-01 84 y.o.  Subjective:  Doing very well Wants to go home  No acute issues Some pain earlier this am but doing better now  Uses walker at baseline, hip fracture 1 year ago    Review of Systems  Respiratory: Negative for shortness of breath and wheezing.   Cardiovascular: Negative for chest pain and palpitations.  Gastrointestinal: Negative for nausea and vomiting.  Neurological: Negative for tingling and sensory change.    Objective:   VITALS:   Vitals:   12/23/17 1601 12/23/17 1801 12/23/17 2133 12/24/17 0653  BP: (!) 177/82 (!) 168/78 (!) 134/51 (!) 111/41  Pulse: 84 87 85 63  Resp: (!) 22 (!) 26 (!) 26 (!) 26  Temp: 97.7 F (36.5 C) 98.1 F (36.7 C) 98.5 F (36.9 C) 98.9 F (37.2 C)  TempSrc: Oral Oral Oral Oral  SpO2: 91% 95% 94% 98%  Weight:    62.6 kg (138 lb)  Height:        Estimated body mass index is 27.87 kg/m as calculated from the following:   Height as of this encounter:  (1.499 m).   Weight as of this encounter: 62.6 kg (138 lb).   Intake/Output      05/25 0701 - 05/26 0700 05/26 0701 - 05/27 0700   P.O.     I.V. (mL/kg) 1100 (17.6)    Total Intake(mL/kg) 1100 (17.6)    Urine (mL/kg/hr) 300 (0.2) 200 (1)   Blood 25    Total Output 325 200   Net +775 -200        Urine Occurrence 4 x      LABS  No results found for this or any previous visit (from the past 24 hour(s)).   PHYSICAL EXAM:   Gen: awake and alert, NAD, appears well, sitting up in bed, family at bedside Lungs: breathing unlabored Cardiac: regular  Ext:       Left Lower Extremity   Dressing and splint fitting well and are c/d/i  Ext warm   R/U/M motor intact  AIN/PIN motor intact  R/U/M sensation intact  Minimal swelling   No pain with passive stretching   Assessment/Plan: 1 Day Post-Op   Principal Problem:   Closed fracture  of right olecranon process Active Problems:   Fall at home, initial encounter   Dementia   Depression   Anti-infectives (From admission, onward)   Start     Dose/Rate Route Frequency Ordered Stop   12/24/17 0000  clindamycin (CLEOCIN) IVPB 600 mg     600 mg 100 mL/hr over 30 Minutes Intravenous Every 6 hours 12/23/17 2323 12/24/17 1759   12/23/17 0600  clindamycin (CLEOCIN) IVPB 900 mg     900 mg 100 mL/hr over 30 Minutes Intravenous To Short Stay 12/22/17 2317 12/23/17 1315    .  POD/HD#: 1  84 y/o female s/p fall with closed R olecranon fracture   - closed R olecranon fracture s/p ORIF  NWB R UEx  Pt will remain in splint until follow up visit (10 days or so)  Ok to move fingers  Sling for comfort  Ice and elevate    Will be restricted from active elbow extension and extension against resistance x 6-8 weeks   - Pain management:  Tylenol  Low dose ultram for severe pain   - ABL anemia/Hemodynamics  Stable  - Medical issues   Per primary   -  DVT/PE prophylaxis:  Does not require pharmacologic prophylaxis as this is an upper extremity injuyr  - ID:   periop abx completed  - Metabolic Bone Disease:  Osteoporosis    History of low energy fractures (hip and olecranon)    History of partial thyroidectomy and parathyroidectomy    Resume vitamin d at discharge   Check vitamin d levels before dc   - Activity:  Up with assistance  NWB R UEx   May need to use platform on walker or 4 point cane as will be unable to use regular walker effectively   - FEN/GI prophylaxis/Foley/Lines:  Diet as tolerated   - Dispo:  Dc home today   Follow up with ortho in 10 days     Mearl Latin, PA-C [facility] [phone] (P) [phone] Traci Sermon (C) 12/24/2017, 10:12 AM

## 2017-12-24 NOTE — Progress Notes (Signed)
Orthopedic Tech Progress Note Patient Details:  Brianna Reynolds 1924/04/11 [id number]  Patient ID: Brianna Reynolds, female   DOB: November 12, 1923, 84 y.o.   MRN: [id number] Pt cant have ohf due to age restrictions.  Trinna Post 12/24/2017, 3:02 PM

## 2017-12-24 NOTE — Progress Notes (Signed)
PROGRESS NOTE    Brianna Reynolds  JXB:[id number] DOB: [birth year] DOA: 12/22/2017 PCP: Sherren Mocha, MD  Brief Narrative:84 y.o.femalewith hx of CAD sp stent, Cdif and dementia fell walking in a parking lot with her walker today and came to ED where work-up shows R elbow fracture. Has been seen by orthopedics who are considering surgery tomorrow. Asked to admit to medical service.   Pt is not good historian with severe dementia family is all here and answering quetsions, patient smoked but quit 50 yrs ago no hx of lung disease asthma use of nebs or inhalers and no cardiac hx not hx stents or heart attack or chf.      Assessment & Plan:   Principal Problem:   Olecranon fracture Active Problems:   Fall at home, initial encounter   Dementia   Depression   Elbow fracture  1] status post mechanical fall with right elbow fracture patient going for surgery today.  She lives at home with this with her family.  2] history of dementia stable at this time.  3] hypothyroidism continue Synthroid.  4]urinary frequency check ua.      DVT prophylaxis heparin :Code Status:full Family Communication:none Disposition Plan:tbd   Consultants: ortho  Procedures:none Antimicrobials:none  Subjective:resting in bed..some confusion overnight.frequent urination reported by the night nurse   Objective: Vitals:   12/23/17 1601 12/23/17 1801 12/23/17 2133 12/24/17 0653  BP: (!) 177/82 (!) 168/78 (!) 134/51 (!) 111/41  Pulse: 84 87 85 63  Resp: (!) 22 (!) 26 (!) 26 (!) 26  Temp: 97.7 F (36.5 C) 98.1 F (36.7 C) 98.5 F (36.9 C) 98.9 F (37.2 C)  TempSrc: Oral Oral Oral Oral  SpO2: 91% 95% 94% 98%  Weight:    62.6 kg (138 lb)  Height:        Intake/Output Summary (Last 24 hours) at 12/24/2017 1007 Last data filed at 12/24/2017 0837 Gross per 24 hour  Intake 1100 ml  Output 525 ml  Net 575 ml   Filed Weights   12/22/17 1738 12/23/17 0605 12/24/17 0653  Weight:  60.9 kg (134 lb 4.2 oz) 63 kg (139 lb) 62.6 kg (138 lb)    Examination:  General exam: Appears calm and comfortable  Respiratory system: Clear to auscultation. Respiratory effort normal. Cardiovascular system: S1 & S2 heard, RRR. No JVD, murmurs, rubs, gallops or clicks. No pedal edema. Gastrointestinal system: Abdomen is nondistended, soft and nontender. No organomegaly or masses felt. Normal bowel sounds heard. Central nervous system: Alert and oriented. No focal neurological deficits. Extremities: Symmetric 5 x 5 power. Skin: No rashes, lesions or ulcers  Data Reviewed: I have personally reviewed following labs and imaging studies  CBC: Recent Labs  Lab 12/22/17 1326  WBC 9.4  HGB 12.5  HCT 39.6  MCV 94.3  PLT 245   Basic Metabolic Panel: Recent Labs  Lab 12/22/17 1326  NA 143  K 3.8  CL 109  CO2 25  GLUCOSE 128*  BUN 15  CREATININE 0.98  CALCIUM 9.7   GFR: Estimated Creatinine Clearance: 30.1 mL/min (by C-G formula based on SCr of 0.98 mg/dL). Liver Function Tests: Recent Labs  Lab 12/22/17 1326  AST 16  ALT 9*  ALKPHOS 52  BILITOT 0.8  PROT 6.4*  ALBUMIN 3.4*   No results for input(s): LIPASE, AMYLASE in the last 168 hours. No results for input(s): AMMONIA in the last 168 hours. Coagulation Profile: No results for input(s): INR, PROTIME in the last 168 hours. Cardiac  Enzymes: No results for input(s): CKTOTAL, CKMB, CKMBINDEX, TROPONINI in the last 168 hours. BNP (last 3 results) No results for input(s): PROBNP in the last 8760 hours. HbA1C: No results for input(s): HGBA1C in the last 72 hours. CBG: No results for input(s): GLUCAP in the last 168 hours. Lipid Profile: No results for input(s): CHOL, HDL, LDLCALC, TRIG, CHOLHDL, LDLDIRECT in the last 72 hours. Thyroid Function Tests: No results for input(s): TSH, T4TOTAL, FREET4, T3FREE, THYROIDAB in the last 72 hours. Anemia Panel: No results for input(s): VITAMINB12, FOLATE, FERRITIN, TIBC,  IRON, RETICCTPCT in the last 72 hours. Sepsis Labs: No results for input(s): PROCALCITON, LATICACIDVEN in the last 168 hours.  Recent Results (from the past 240 hour(s))  Surgical pcr screen     Status: None   Collection Time: 12/22/17  6:18 PM  Result Value Ref Range Status   MRSA, PCR NEGATIVE NEGATIVE Final   Staphylococcus aureus NEGATIVE NEGATIVE Final    Comment: (NOTE) The Xpert SA Assay (FDA approved for NASAL specimens in patients 37 years of age and older), is one component of a comprehensive surveillance program. It is not intended to diagnose infection nor to guide or monitor treatment. Performed at [facility], [address], [place], [place] [address]          Radiology Studies: Dg Chest 2 View  Result Date: 12/22/2017 CLINICAL DATA:  Wheezing. EXAM: CHEST - 2 VIEW COMPARISON:  No prior. FINDINGS: Mediastinum and hilar structures normal. Stable cardiomegaly with normal pulmonary vascularity. Low lung volumes with mild basilar atelectasis/infiltrates. IMPRESSION: 1.  Low lung volumes with mild basilar atelectasis/infiltrates. 2.  Stable cardiomegaly.  No pulmonary venous congestion. 3.  Sliding hiatal hernia. Electronically Signed   By: Maisie Fus  Register   On: 12/22/2017 15:09   Dg Elbow 2 Views Right  Result Date: 12/24/2017 CLINICAL DATA:  Postop EXAM: RIGHT ELBOW - 2 VIEW COMPARISON:  12/22/2017 FINDINGS: Casting material obscures bone detail. Surgical plate and screw fixation of the proximal ulna across olecranon fracture with anatomic alignment. Radial head alignment within normal limits. The previously questioned fractures at the proximal radius and distal humerus are not well demonstrated on today's study. IMPRESSION: Surgical plate and multiple screw fixation of proximal ulna for olecranon fracture. Electronically Signed   By: Jasmine Pang M.D.   On: 12/24/2017 01:42   Dg Elbow Complete Right  Result Date: 12/23/2017 CLINICAL DATA:  Open reduction  internal fixation of the right elbow. EXAM: RIGHT ELBOW - COMPLETE 3+ VIEW; DG C-ARM 61-120 MIN COMPARISON:  Radiograph 12/22/2017 FINDINGS: Three fluoroscopic intraoperative images. Sideplate and screw fixation of comminuted olecranon fracture. The alignment of the elbow is near anatomic. Fluoroscopy time is recorded as 9 seconds. IMPRESSION: Intraoperative images from sideplate and screw fixation of comminuted olecranon fracture with near anatomic alignment. Electronically Signed   By: Ted Mcalpine M.D.   On: 12/23/2017 15:02   Dg Elbow Complete Right (3+view)  Result Date: 12/22/2017 CLINICAL DATA:  Recent fall with right posterior elbow pain EXAM: RIGHT ELBOW - COMPLETE 3+ VIEW COMPARISON:  None. FINDINGS: Splinting material is noted somewhat precluding fine bony detail and the ability to fully straighten the arm. A olecranon process fracture extending into the articular surface is noted with distraction of approximately the 11 mm. There also changes suspicious for radial head fracture although incompletely evaluated cortical fracture is noted in the distal humerus as well. IMPRESSION: Multiple fractures about the elbow. If further imaging is necessary CT may be the best  option for fine bony detail. Electronically Signed   By: Alcide Clever M.D.   On: 12/22/2017 14:15   Dg C-arm 1-60 Min  Result Date: 12/23/2017 CLINICAL DATA:  Open reduction internal fixation of the right elbow. EXAM: RIGHT ELBOW - COMPLETE 3+ VIEW; DG C-ARM 61-120 MIN COMPARISON:  Radiograph 12/22/2017 FINDINGS: Three fluoroscopic intraoperative images. Sideplate and screw fixation of comminuted olecranon fracture. The alignment of the elbow is near anatomic. Fluoroscopy time is recorded as 9 seconds. IMPRESSION: Intraoperative images from sideplate and screw fixation of comminuted olecranon fracture with near anatomic alignment. Electronically Signed   By: Ted Mcalpine M.D.   On: 12/23/2017 15:02        Scheduled  Meds: . aspirin EC  81 mg Oral Daily  . cholecalciferol  2,000 Units Oral Daily  . docusate sodium  100 mg Oral BID  . levothyroxine  88 mcg Oral QAC breakfast  . multivitamin  1 tablet Oral Daily  . oxybutynin  15 mg Oral QHS  . polyethylene glycol  17 g Oral Daily  . sertraline  100 mg Oral Daily   Continuous Infusions: . sodium chloride 50 mL/hr at 12/24/17 0048  . clindamycin (CLEOCIN) IV Stopped (12/24/17 0740)  . lactated ringers 10 mL/hr at 12/23/17 1039     LOS: 2 days      Alwyn Ren, MD [facility]  If 7PM-7AM, please contact night-coverage [email or web address] Password [email or web address] 12/24/2017, 10:07 AM

## 2017-12-24 NOTE — Discharge Instructions (Signed)
Orthopaedic Trauma Service Discharge Instructions   General Discharge Instructions  WEIGHT BEARING STATUS: nonweightbearing right upper extremity (ok to use platform on walker)  RANGE OF MOTION/ACTIVITY: finger motion as tolerated. You are in a splint so no elbow motion at this time. Sling for comfort  Wound Care: no wound care at this time. We will change you dressing at your first follow up visit    Diet: as you were eating previously.  Can use over the counter stool softeners and bowel preparations, such as Miralax, to help with bowel movements.  Narcotics can be constipating.  Be sure to drink plenty of fluids  PAIN MEDICATION USE AND EXPECTATIONS  You have likely been given narcotic medications to help control your pain.  After a traumatic event that results in an fracture (broken bone) with or without surgery, it is ok to use narcotic pain medications to help control one's pain.  We understand that everyone responds to pain differently and each individual patient will be evaluated on a regular basis for the continued need for narcotic medications. Ideally, narcotic medication use should last no more than 6-8 weeks (coinciding with fracture healing).   As a patient it is your responsibility as well to monitor narcotic medication use and report the amount and frequency you use these medications when you come to your office visit.   We would also advise that if you are using narcotic medications, you should take a dose prior to therapy to maximize you participation.  IF YOU ARE ON NARCOTIC MEDICATIONS IT IS NOT PERMISSIBLE TO OPERATE A MOTOR VEHICLE (MOTORCYCLE/CAR/TRUCK/MOPED) OR HEAVY MACHINERY DO NOT MIX NARCOTICS WITH OTHER CNS (CENTRAL NERVOUS SYSTEM) DEPRESSANTS SUCH AS ALCOHOL   STOP SMOKING OR USING NICOTINE PRODUCTS!!!!  As discussed nicotine severely impairs your body's ability to heal surgical and traumatic wounds but also impairs bone healing.  Wounds and bone heal by forming  microscopic blood vessels (angiogenesis) and nicotine is a vasoconstrictor (essentially, shrinks blood vessels).  Therefore, if vasoconstriction occurs to these microscopic blood vessels they essentially disappear and are unable to deliver necessary nutrients to the healing tissue.  This is one modifiable factor that you can do to dramatically increase your chances of healing your injury.    (This means no smoking, no nicotine gum, patches, etc)  DO NOT USE NONSTEROIDAL ANTI-INFLAMMATORY DRUGS (NSAID'S)  Using products such as Advil (ibuprofen), Aleve (naproxen), Motrin (ibuprofen) for additional pain control during fracture healing can delay and/or prevent the healing response.  If you would like to take over the counter (OTC) medication, Tylenol (acetaminophen) is ok.  However, some narcotic medications that are given for pain control contain acetaminophen as well. Therefore, you should not exceed more than 4000 mg of tylenol in a day if you do not have liver disease.  Also note that there are may OTC medicines, such as cold medicines and allergy medicines that my contain tylenol as well.  If you have any questions about medications and/or interactions please ask your doctor/PA or your pharmacist.      ICE AND ELEVATE INJURED/OPERATIVE EXTREMITY  Using ice and elevating the injured extremity above your heart can help with swelling and pain control.  Icing in a pulsatile fashion, such as 20 minutes on and 20 minutes off, can be followed.    Do not place ice directly on skin. Make sure there is a barrier between to skin and the ice pack.    Using frozen items such as frozen peas works well as  the conform nicely to the are that needs to be iced.  USE AN ACE WRAP OR TED HOSE FOR SWELLING CONTROL  In addition to icing and elevation, Ace wraps or TED hose are used to help limit and resolve swelling.  It is recommended to use Ace wraps or TED hose until you are informed to stop.    When using Ace Wraps  start the wrapping distally (farthest away from the body) and wrap proximally (closer to the body)   Example: If you had surgery on your leg or thing and you do not have a splint on, start the ace wrap at the toes and work your way up to the thigh        If you had surgery on your upper extremity and do not have a splint on, start the ace wrap at your fingers and work your way up to the upper arm  IF YOU ARE IN A SPLINT OR CAST DO NOT REMOVE IT FOR ANY REASON   If your splint gets wet for any reason please contact the office immediately. You may shower in your splint or cast as long as you keep it dry.  This can be done by wrapping in a cast cover or garbage back (or similar)  Do Not stick any thing down your splint or cast such as pencils, money, or hangers to try and scratch yourself with.  If you feel itchy take benadryl as prescribed on the bottle for itching  IF YOU ARE IN A CAM BOOT (BLACK BOOT)  You may remove boot periodically. Perform daily dressing changes as noted below.  Wash the liner of the boot regularly and wear a sock when wearing the boot. It is recommended that you sleep in the boot until told otherwise  CALL THE OFFICE WITH ANY QUESTIONS OR CONCERNS: [phone]

## 2017-12-24 NOTE — Care Management Note (Addendum)
Case Management Note  Patient Details  Name: CAPRINA WUSSOW MRN: [id number] Date of Birth: 23-Feb-1924  Subjective/Objective:  84 yo female presenting with mechanical fall while walking in parking low resulting in right olecranon fracture. S/p ORIF right olecranon ORIF on 12/23/17.  PTA, pt requires assistance with ADLS; lives with spouse.  Daughter provides intermittent assistance.                 Action/Plan: PT/OT evaluations today; both feel that pt would be a good "Home First" candidate with [facility], and pt meets all needed criteria.  Referral made to [facility] with Frances Furbish, who will evaluate for home program on Monday, 5/27.    Expected Discharge Date:                  Expected Discharge Plan:  Home w Home Health Services  In-House Referral:     Discharge planning Services  CM Consult  Post Acute Care Choice:    Choice offered to:     DME Arranged:    DME Agency:     HH Arranged:    HH Agency:     Status of Service:  In process, will continue to follow  If discussed at Long Length of Stay Meetings, dates discussed:    Additional Comments:  Quintella Baton, RN, BSN  Trauma/Neuro ICU Case Manager [phone]

## 2017-12-25 ENCOUNTER — Encounter (HOSPITAL_COMMUNITY): Payer: Self-pay | Admitting: Orthopedic Surgery

## 2017-12-25 LAB — BASIC METABOLIC PANEL
Anion gap: 9 (ref 5–15)
BUN: 13 mg/dL (ref 6–20)
CHLORIDE: 110 mmol/L (ref 101–111)
CO2: 23 mmol/L (ref 22–32)
Calcium: 8.7 mg/dL — ABNORMAL LOW (ref 8.9–10.3)
Creatinine, Ser: 0.75 mg/dL (ref 0.44–1.00)
Glucose, Bld: 93 mg/dL (ref 65–99)
POTASSIUM: 3.5 mmol/L (ref 3.5–5.1)
SODIUM: 142 mmol/L (ref 135–145)

## 2017-12-25 LAB — CBC WITH DIFFERENTIAL/PLATELET
Abs Immature Granulocytes: 0 10*3/uL (ref 0.0–0.1)
BASOS ABS: 0.1 10*3/uL (ref 0.0–0.1)
BASOS PCT: 1 %
EOS ABS: 0.3 10*3/uL (ref 0.0–0.7)
EOS PCT: 3 %
HCT: 35.7 % — ABNORMAL LOW (ref 36.0–46.0)
Hemoglobin: 11.6 g/dL — ABNORMAL LOW (ref 12.0–15.0)
Immature Granulocytes: 0 %
Lymphocytes Relative: 23 %
Lymphs Abs: 2.2 10*3/uL (ref 0.7–4.0)
MCH: 30.1 pg (ref 26.0–34.0)
MCHC: 32.5 g/dL (ref 30.0–36.0)
MCV: 92.7 fL (ref 78.0–100.0)
Monocytes Absolute: 0.8 10*3/uL (ref 0.1–1.0)
Monocytes Relative: 9 %
Neutro Abs: 6.3 10*3/uL (ref 1.7–7.7)
Neutrophils Relative %: 64 %
Platelets: 219 10*3/uL (ref 150–400)
RBC: 3.85 MIL/uL — AB (ref 3.87–5.11)
RDW: 13.2 % (ref 11.5–15.5)
WBC: 9.6 10*3/uL (ref 4.0–10.5)

## 2017-12-25 LAB — VITAMIN D 25 HYDROXY (VIT D DEFICIENCY, FRACTURES): Vit D, 25-Hydroxy: 41.5 ng/mL (ref 30.0–100.0)

## 2017-12-25 MED ORDER — POLYETHYLENE GLYCOL 3350 17 G PO PACK: 17.0000 g | PACK | Freq: Every day | ORAL | 0 refills | Status: AC

## 2017-12-25 NOTE — Discharge Summary (Signed)
Physician Discharge Summary  Brianna Reynolds:[id number] DOB: [birth year] DOA: 12/22/2017  PCP: Sherren Mocha, MD  Admit date: 12/22/2017 Discharge date: 12/25/2017  Admitted From:home Disposition: Home  Recommendations for Outpatient Follow-up:  1. Follow up with PCP in 1-2 weeks 2. Please obtain BMP/CBC in one week 3. Follow-up with Ortho in 10 days  Home Health: Home health PT Equipment/Devices rolling 5 inch walker with right upper extremity platform Discharge Condition stable CODE STATUS full code Diet recommendation: Cardiac Brief/Interim Summary:84 y.o.femalewith hx of CAD sp stent, Cdif and dementia fell walking in a parking lot with her walker today and came to ED where work-up shows R elbow fracture. Has been seen by orthopedics who are considering surgery tomorrow. Asked to admit to medical service.   Pt is not good historian with severe dementia family is all here and answering quetsions, patient smoked but quit 50 yrs ago no hx of lung disease asthma use of nebs or inhalers and no cardiac hx not hx stents or heart attack or chf.   Discharge Diagnoses:  Principal Problem:   Closed fracture of right olecranon process Active Problems:   Fall at home, initial encounter   Dementia   Depression 1]status post mechanical fall with right elbow fracture-status post ORIF of closed right olecranon fracture.  Patient is nonweightbearing to the right upper extremity.  Okay to move fingers.  Sling for comfort.  Elevate and ice.  Patient will remain in splint for up to 10 days.  Follow-up with Ortho in about 10 days.  Will be restricted from active elbow extension and extension against resistance for 6 to 8 weeks.  Patient will be using Tylenol or alternate Ultram for severe pain.  2]history of dementia stable at this time.  3]hypothyroidism continue Synthroid.       Discharge Instructions  Discharge Instructions    Call MD for:  difficulty breathing, headache  or visual disturbances   Complete by:  As directed    Call MD for:  persistant dizziness or light-headedness   Complete by:  As directed    Call MD for:  persistant nausea and vomiting   Complete by:  As directed    Call MD for:  severe uncontrolled pain   Complete by:  As directed    Call MD for:  temperature >100.4   Complete by:  As directed    Diet - low sodium heart healthy   Complete by:  As directed    Increase activity slowly   Complete by:  As directed      Allergies as of 12/25/2017      Reactions   Penicillins Swelling, Rash   Has patient had a PCN reaction causing immediate rash, facial/tongue/throat swelling, SOB or lightheadedness with hypotension: Yes Has patient had a PCN reaction causing severe rash involving mucus membranes or skin necrosis: Unk Has patient had a PCN reaction that required hospitalization: No Has patient had a PCN reaction occurring within the last 10 years: No If all of the above answers are "NO", then may proceed with Cephalosporin use.      Medication List    STOP taking these medications   ibuprofen 200 MG tablet Commonly known as:  ADVIL,MOTRIN     TAKE these medications   acetaminophen 500 MG tablet Commonly known as:  TYLENOL Take 2 tablets (1,000 mg total) by mouth every 6 (six) hours. What changed:    medication strength  how much to take  when to take this  reasons to take this   aspirin EC 81 MG tablet Take 81 mg by mouth daily.   CALTRATE 600 PO Take 1 tablet by mouth daily.   donepezil 10 MG tablet Commonly known as:  ARICEPT Take 10 mg by mouth at bedtime.   levothyroxine 88 MCG tablet Commonly known as:  SYNTHROID, LEVOTHROID TAKE 1 TABLET IN THE MORNING BEFORE BREAKFAST.   Melatonin 5 MG Tabs Take 5 mg by mouth at bedtime.   multivitamin capsule Take 1 capsule by mouth daily.   oxybutynin 15 MG 24 hr tablet Commonly known as:  DITROPAN XL Take 1 tablet (15 mg total) by mouth at bedtime.    polyethylene glycol packet Commonly known as:  MIRALAX / GLYCOLAX Take 17 g by mouth daily. Start taking on:  12/26/2017   PRESERVISION AREDS Caps Take 1 capsule by mouth daily.   QUEtiapine 25 MG tablet Commonly known as:  SEROQUEL TAKE ONE TABLET AT BEDTIME AS NEEDED FOR INSOMNIA-MAY REPEAT IN 1-2 HOURS IF NEEDED. What changed:    how much to take  how to take this  when to take this  additional instructions   sertraline 100 MG tablet Commonly known as:  ZOLOFT Take 1 tablet (100 mg total) daily by mouth.   traMADol 50 MG tablet Commonly known as:  ULTRAM Take 1 tablet (50 mg total) by mouth every 8 (eight) hours as needed for moderate pain or severe pain.   Vitamin D-3 1000 units Caps Take 2,000 Units by mouth daily.            Durable Medical Equipment  (From admission, onward)        Start     Ordered   12/25/17 1006  For home use only DME Walker rolling  [facility])  Once    Question:  Patient needs a walker to treat with the following condition  Answer:  Fracture   12/25/17 1005   12/25/17 1005  For home use only DME Walker platform  [facility])  Once    Question:  Patient needs a walker to treat with the following condition  Answer:  Fracture   12/25/17 1005   12/24/17 1019  For home use only DME Walker platform  Once    Question:  Patient needs a walker to treat with the following condition  Answer:  Closed fracture of right olecranon process   12/24/17 1018     Follow-up Information    Myrene Galas, MD. Schedule an appointment as soon as possible for a visit on 01/03/2018.   Specialty:  Orthopedic Surgery Contact information: [address] [place] [address] [phone]          Allergies  Allergen Reactions  . Penicillins Swelling and Rash    Has patient had a PCN reaction causing immediate rash, facial/tongue/throat swelling, SOB or lightheadedness with hypotension: Yes Has patient had a PCN reaction causing severe  rash involving mucus membranes or skin necrosis: Unk Has patient had a PCN reaction that required hospitalization: No Has patient had a PCN reaction occurring within the last 10 years: No If all of the above answers are "NO", then may proceed with Cephalosporin use.     Consultations:  ortho   Procedures/Studies: Dg Chest 2 View  Result Date: 12/22/2017 CLINICAL DATA:  Wheezing. EXAM: CHEST - 2 VIEW COMPARISON:  No prior. FINDINGS: Mediastinum and hilar structures normal. Stable cardiomegaly with normal pulmonary vascularity. Low lung volumes with mild basilar atelectasis/infiltrates. IMPRESSION: 1.  Low  lung volumes with mild basilar atelectasis/infiltrates. 2.  Stable cardiomegaly.  No pulmonary venous congestion. 3.  Sliding hiatal hernia. Electronically Signed   By: Maisie Fus  Register   On: 12/22/2017 15:09   Dg Elbow 2 Views Right  Result Date: 12/24/2017 CLINICAL DATA:  Postop EXAM: RIGHT ELBOW - 2 VIEW COMPARISON:  12/22/2017 FINDINGS: Casting material obscures bone detail. Surgical plate and screw fixation of the proximal ulna across olecranon fracture with anatomic alignment. Radial head alignment within normal limits. The previously questioned fractures at the proximal radius and distal humerus are not well demonstrated on today's study. IMPRESSION: Surgical plate and multiple screw fixation of proximal ulna for olecranon fracture. Electronically Signed   By: Jasmine Pang M.D.   On: 12/24/2017 01:42   Dg Elbow Complete Right  Result Date: 12/23/2017 CLINICAL DATA:  Open reduction internal fixation of the right elbow. EXAM: RIGHT ELBOW - COMPLETE 3+ VIEW; DG C-ARM 61-120 MIN COMPARISON:  Radiograph 12/22/2017 FINDINGS: Three fluoroscopic intraoperative images. Sideplate and screw fixation of comminuted olecranon fracture. The alignment of the elbow is near anatomic. Fluoroscopy time is recorded as 9 seconds. IMPRESSION: Intraoperative images from sideplate and screw fixation of  comminuted olecranon fracture with near anatomic alignment. Electronically Signed   By: Ted Mcalpine M.D.   On: 12/23/2017 15:02   Dg Elbow Complete Right (3+view)  Result Date: 12/22/2017 CLINICAL DATA:  Recent fall with right posterior elbow pain EXAM: RIGHT ELBOW - COMPLETE 3+ VIEW COMPARISON:  None. FINDINGS: Splinting material is noted somewhat precluding fine bony detail and the ability to fully straighten the arm. A olecranon process fracture extending into the articular surface is noted with distraction of approximately the 11 mm. There also changes suspicious for radial head fracture although incompletely evaluated cortical fracture is noted in the distal humerus as well. IMPRESSION: Multiple fractures about the elbow. If further imaging is necessary CT may be the best option for fine bony detail. Electronically Signed   By: Alcide Clever M.D.   On: 12/22/2017 14:15   Dg C-arm 1-60 Min  Result Date: 12/23/2017 CLINICAL DATA:  Open reduction internal fixation of the right elbow. EXAM: RIGHT ELBOW - COMPLETE 3+ VIEW; DG C-ARM 61-120 MIN COMPARISON:  Radiograph 12/22/2017 FINDINGS: Three fluoroscopic intraoperative images. Sideplate and screw fixation of comminuted olecranon fracture. The alignment of the elbow is near anatomic. Fluoroscopy time is recorded as 9 seconds. IMPRESSION: Intraoperative images from sideplate and screw fixation of comminuted olecranon fracture with near anatomic alignment. Electronically Signed   By: Ted Mcalpine M.D.   On: 12/23/2017 15:02    (Echo, Carotid, EGD, Colonoscopy, ERCP)    Subjective:   Discharge Exam: Vitals:   12/24/17 2046 12/25/17 0428  BP: (!) 147/52 (!) 147/56  Pulse: 65 61  Resp:  12  Temp: 98.2 F (36.8 C) 98.6 F (37 C)  SpO2: 96% 92%   Vitals:   12/24/17 0653 12/24/17 1419 12/24/17 2046 12/25/17 0428  BP: (!) 111/41 (!) 137/53 (!) 147/52 (!) 147/56  Pulse: 63 72 65 61  Resp: (!) Temp: 98.9 F (37.2 C)  98.3 F (36.8 C) 98.2 F (36.8 C) 98.6 F (37 C)  TempSrc: Oral Oral Oral Oral  SpO2: 98% 96% 96% 92%  Weight: 62.6 kg (138 lb)   58.5 kg (129 lb)  Height:        General: Pt is alert, awake, not in acute distress Cardiovascular: RRR, S1/S2 +, no rubs, no gallops Respiratory: CTA bilaterally,  no wheezing, no rhonchi Abdominal: Soft, NT, ND, bowel sounds + Extremities: no edema, no cyanosis    The results of significant diagnostics from this hospitalization (including imaging, microbiology, ancillary and laboratory) are listed below for reference.     Microbiology: Recent Results (from the past 240 hour(s))  Surgical pcr screen     Status: None   Collection Time: 12/22/17  6:18 PM  Result Value Ref Range Status   MRSA, PCR NEGATIVE NEGATIVE Final   Staphylococcus aureus NEGATIVE NEGATIVE Final    Comment: (NOTE) The Xpert SA Assay (FDA approved for NASAL specimens in patients 81 years of age and older), is one component of a comprehensive surveillance program. It is not intended to diagnose infection nor to guide or monitor treatment. Performed at [facility], [address] [place], [place] [address]      Labs: BNP (last 3 results) No results for input(s): BNP in the last 8760 hours. Basic Metabolic Panel: Recent Labs  Lab 12/22/17 1326 12/24/17 1058 12/25/17 0512  NA 143  --  142  K 3.8  --  3.5  CL 109  --  110  CO2 25  --  23  GLUCOSE 128*  --  93  BUN 15  --  13  CREATININE 0.98  --  0.75  CALCIUM 9.7  --  8.7*  MG  --  1.7  --   PHOS  --  3.1  --    Liver Function Tests: Recent Labs  Lab 12/22/17 1326  AST 16  ALT 9*  ALKPHOS 52  BILITOT 0.8  PROT 6.4*  ALBUMIN 3.4*   No results for input(s): LIPASE, AMYLASE in the last 168 hours. No results for input(s): AMMONIA in the last 168 hours. CBC: Recent Labs  Lab 12/22/17 1326 12/25/17 0512  WBC 9.4 9.6  NEUTROABS  --  6.3  HGB 12.5 11.6*  HCT 39.6 35.7*  MCV 94.3 92.7  PLT 245  219   Cardiac Enzymes: No results for input(s): CKTOTAL, CKMB, CKMBINDEX, TROPONINI in the last 168 hours. BNP: Invalid input(s): POCBNP CBG: No results for input(s): GLUCAP in the last 168 hours. D-Dimer No results for input(s): DDIMER in the last 72 hours. Hgb A1c No results for input(s): HGBA1C in the last 72 hours. Lipid Profile No results for input(s): CHOL, HDL, LDLCALC, TRIG, CHOLHDL, LDLDIRECT in the last 72 hours. Thyroid function studies No results for input(s): TSH, T4TOTAL, T3FREE, THYROIDAB in the last 72 hours.  Invalid input(s): FREET3 Anemia work up No results for input(s): VITAMINB12, FOLATE, FERRITIN, TIBC, IRON, RETICCTPCT in the last 72 hours. Urinalysis    Component Value Date/Time   COLORURINE YELLOW 12/24/2017 0714   APPEARANCEUR CLEAR 12/24/2017 0714   LABSPEC 1.013 12/24/2017 0714   PHURINE 5.0 12/24/2017 0714   GLUCOSEU NEGATIVE 12/24/2017 0714   HGBUR SMALL (A) 12/24/2017 0714   BILIRUBINUR NEGATIVE 12/24/2017 0714   BILIRUBINUR small (A) 08/03/2017 1309   BILIRUBINUR jneg 07/03/2014 1237   KETONESUR NEGATIVE 12/24/2017 0714   PROTEINUR NEGATIVE 12/24/2017 0714   UROBILINOGEN 0.2 08/03/2017 1309   UROBILINOGEN 0.2 03/24/2014 2135   NITRITE NEGATIVE 12/24/2017 0714   LEUKOCYTESUR NEGATIVE 12/24/2017 0714   Sepsis Labs Invalid input(s): PROCALCITONIN,  WBC,  LACTICIDVEN Microbiology Recent Results (from the past 240 hour(s))  Surgical pcr screen     Status: None   Collection Time: 12/22/17  6:18 PM  Result Value Ref Range Status   MRSA, PCR NEGATIVE NEGATIVE Final  Staphylococcus aureus NEGATIVE NEGATIVE Final    Comment: (NOTE) The Xpert SA Assay (FDA approved for NASAL specimens in patients 36 years of age and older), is one component of a comprehensive surveillance program. It is not intended to diagnose infection nor to guide or monitor treatment. Performed at [facility], [address], [place], [place] [address]       Time coordinating discharge: 34 minutes  SIGNED:   Alwyn Ren, MD  [facility] 12/25/2017, 10:06 AM Pager   If 7PM-7AM, please contact night-coverage [email or web address] Password [email or web address]

## 2017-12-25 NOTE — Progress Notes (Signed)
Occupational Therapy Treatment Patient Details Name: Brianna Reynolds MRN: [id number] DOB: Aug 04, 1923 Today's Date: 12/25/2017    History of present illness 84 yo female presenting with mechanical fall while walking in parking low resulting in right olecranon fracture. S/p ORIF right olecranon fx on 12/23/17. PHM including right hip fx (May 2018), depression, CAD sp stent, Cdif, and dementia.   OT comments  Pt progressing towards established OT goals. Pt performing toileting with Min Guard A and Min cues for using left hand instead of RUE. Providing education on UB dressing techniques. Pt donning bra and shirt with Max A from daughter. Educating pt and family on UB bathing and need for sponge bathing. Pt planning for dc later today. Continue to recommend post-acute rehab and will continue to follow acutely as admitted.    Follow Up Recommendations  Supervision/Assistance - 24 hour;Home health OT([facility] First Program)    Equipment Recommendations  None recommended by OT    Recommendations for Other Services PT consult    Precautions / Restrictions Precautions Precautions: Fall Restrictions Weight Bearing Restrictions: Yes RUE Weight Bearing: Weight bear through elbow only Other Position/Activity Restrictions: Verbal order per PA for weight bearing through elbow only.        Mobility Bed Mobility               General bed mobility comments: pt in chair  Transfers Overall transfer level: Needs assistance Equipment used: Right platform walker Transfers: Sit to/from Stand Sit to Stand: Min assist         General transfer comment: min A to steady and min  A to place RUE on PFRW. Pt needed cueing before each sit to remove RUE from platform.     Balance Overall balance assessment: Needs assistance Sitting-balance support: Feet supported Sitting balance-Leahy Scale: Fair Sitting balance - Comments: Able to bend forward to adjust socks at EOB without LOB   Standing  balance support: Single extremity supported;During functional activity Standing balance-Leahy Scale: Poor Standing balance comment: reliant on single UE support for balance. Noted posterior lean                           ADL either performed or assessed with clinical judgement   ADL Overall ADL's : Needs assistance/impaired           Upper Body Bathing Details (indicate cue type and reason): Educating pt and daughter's on UB bathing techniques and to prevent RUE from getting wet. Need for sponge bathing     Upper Body Dressing : Maximal assistance;Sitting;With caregiver independent assisting Upper Body Dressing Details (indicate cue type and reason): Educating pt and daughters on donning/doffing bra and shirt. Pt performing with Max A from daughter Lower Body Dressing: Minimal assistance Lower Body Dressing Details (indicate cue type and reason): Min A for safety in standing with slight posterior lean Toilet Transfer: Ambulation;Min guard;Regular Toilet;RW;Grab bars(Right plateform RW)   Toileting- Clothing Manipulation and Hygiene: Min guard;Sitting/lateral lean Toileting - Clothing Manipulation Details (indicate cue type and reason): Min cues to use left hand instead of right hand     Functional mobility during ADLs: Minimal assistance(right plateform walker) General ADL Comments: Pt with decreased funcitonal performance due to limited ROM and movement of dominant UE (right). Pt with decreased ST memory and requiring cues for adherance to NWB status. At nest session, will need to address ADLs with use of plateform walker      Vision  Perception     Praxis      Cognition Arousal/Alertness: Awake/alert Behavior During Therapy: WFL for tasks assessed/performed Overall Cognitive Status: History of cognitive impairments - at baseline                                 General Comments: Baseline dementia        Exercises     Shoulder  Instructions       General Comments Daughters present throughout session. Husband present and stepping out for dressing    Pertinent Vitals/ Pain       Pain Assessment: Faces Faces Pain Scale: Hurts little more Pain Location: RUE Pain Descriptors / Indicators: Discomfort;Grimacing Pain Intervention(s): Monitored during session;Repositioned  Home Living                                          Prior Functioning/Environment              Frequency  Min 2X/week        Progress Toward Goals  OT Goals(current goals can now be found in the care plan section)  Progress towards OT goals: Progressing toward goals  Acute Rehab OT Goals Patient Stated Goal: Family wants her walking safely and prevent falls. OT Goal Formulation: With patient/family Time For Goal Achievement: 01/07/18 Potential to Achieve Goals: Good  Plan Discharge plan remains appropriate    Co-evaluation                 AM-PAC PT "6 Clicks" Daily Activity     Outcome Measure   Help from another person eating meals?: A Little Help from another person taking care of personal grooming?: A Little Help from another person toileting, which includes using toliet, bedpan, or urinal?: A Lot Help from another person bathing (including washing, rinsing, drying)?: A Lot Help from another person to put on and taking off regular upper body clothing?: A Lot Help from another person to put on and taking off regular lower body clothing?: A Lot 6 Click Score: 14    End of Session Equipment Utilized During Treatment: Gait belt;Other (comment)(right plateform walker)  OT Visit Diagnosis: Unsteadiness on feet (R26.81);Other abnormalities of gait and mobility (R26.89);Muscle weakness (generalized) (M62.81);Pain Pain - Right/Left: Right Pain - part of body: Arm   Activity Tolerance Patient tolerated treatment well   Patient Left in chair;with call bell/phone within reach;with chair alarm  set;with family/visitor present   Nurse Communication Mobility status;Weight bearing status        Time: 1338-1400 OT Time Calculation (min): 22 min  Charges: OT General Charges $OT Visit: 1 Visit OT Treatments $Self Care/Home Management : 8-22 mins  Bailey Kolbe MSOT, OTR/L Acute Rehab Pager: [phone] Office: [phone]   Theodoro Grist [place] 12/25/2017, 2:25 PM

## 2017-12-25 NOTE — Progress Notes (Signed)
Discharge home. HHneeds addressed by case Production designer, theatre/television/film. Home discharge instruction given to spouse and children. No question verbalized.

## 2017-12-25 NOTE — Care Management Note (Signed)
Case Management Note  Patient Details  Name: Brianna Reynolds MRN: [id number] Date of Birth: June 10, 1924  Subjective/Objective:                    Action/Plan:  Rolling walker with 5" wheels and RUE platform ordered through [facility].   Kandee Keen with [facility] aware patient discharged and will talk with patient and family regarding home first program . Family aware. Expected Discharge Date:  12/25/17               Expected Discharge Plan:  Home w Home Health Services  In-House Referral:     Discharge planning Services  CM Consult  Post Acute Care Choice:  Durable Medical Equipment Choice offered to:  Patient, Spouse  DME Arranged:  Walker rolling DME Agency:  [facility]  HH Arranged:  PT HH Agency:  [facility]  Status of Service:  Completed, signed off  If discussed at Long Length of Stay Meetings, dates discussed:    Additional Comments:  Kingsley Plan, RN 12/25/2017, 10:24 AM

## 2017-12-25 NOTE — Progress Notes (Signed)
Physical Therapy Treatment Patient Details Name: Brianna Reynolds MRN: [id number] DOB: [birth year] Today's Date: 12/25/2017    History of Present Illness 84 yo female presenting with mechanical fall while walking in parking low resulting in right olecranon fracture. S/p ORIF right olecranon fx on 12/23/17. PHM including right hip fx (May 2018), depression, CAD sp stent, Cdif, and dementia.    PT Comments    Pt practiced ambulation with R PFRW, will need youth height as the regular size was too tall even at lowest setting. Min A needed for sit to stand as well as min A to help guide PFRW due to pt with decreased ability to push with RUE. Pt moves quickly with decreased attention to safety and will need someone with her when she's up even with use of RW. In addition, pt is not remembering to unstrap RUE from RW before sitting down so will need consistent cueing for this. PT will continue to follow.    Follow Up Recommendations  Home health PT;Other (comment)(Home First)     Equipment Recommendations  Rolling walker with 5" wheels;Other (comment)(youth height with R platform)    Recommendations for Other Services       Precautions / Restrictions Precautions Precautions: Fall Restrictions Weight Bearing Restrictions: Yes RUE Weight Bearing: Weight bear through elbow only Other Position/Activity Restrictions: Verbal order per PA for weight bearing through elbow only.     Mobility  Bed Mobility               General bed mobility comments: pt in chair  Transfers Overall transfer level: Needs assistance Equipment used: Right platform walker Transfers: Sit to/from Stand Sit to Stand: Min assist         General transfer comment: min A to steady and min  A to place RUE on PFRW. Pt needed cueing before each sit to remove RUE from platform.   Ambulation/Gait Ambulation/Gait assistance: Min assist Ambulation Distance (Feet): 60 Feet Assistive device: Right platform  walker Gait Pattern/deviations: Step-through pattern;Trunk flexed Gait velocity: WFL Gait velocity interpretation: 1.31 - 2.62 ft/sec, indicative of limited community ambulator General Gait Details: min A to help guide RW as it veered continually to L with decreased push from pt's RUE. Pt ambulates with L toe in and inability to correct. vc's for erect posture were effective and helped pt to stay closer to RW. She will still need someone right with her during ambulation even with use of RW   Stairs             Wheelchair Mobility    Modified Rankin (Stroke Patients Only)       Balance Overall balance assessment: Needs assistance Sitting-balance support: Feet supported Sitting balance-Leahy Scale: Fair     Standing balance support: Single extremity supported;During functional activity Standing balance-Leahy Scale: Poor Standing balance comment: reliant on single UE support for balance. Noted posterior lean                            Cognition Arousal/Alertness: Awake/alert Behavior During Therapy: WFL for tasks assessed/performed Overall Cognitive Status: History of cognitive impairments - at baseline                                 General Comments: Baseline dementia      Exercises      General Comments General comments (skin integrity, edema, etc.): pt moves quickly  with decreased attention to safety, vc's for attending to obstacles and for safety with changes in direction      Pertinent Vitals/Pain Pain Assessment: Faces Faces Pain Scale: Hurts even more Pain Location: RUE Pain Descriptors / Indicators: Discomfort;Grimacing Pain Intervention(s): Limited activity within patient's tolerance    Home Living                      Prior Function            PT Goals (current goals can now be found in the care plan section) Acute Rehab PT Goals Patient Stated Goal: Family wants her walking safely and prevent falls. PT Goal  Formulation: With patient/family Time For Goal Achievement: 01/07/18 Potential to Achieve Goals: Good Progress towards PT goals: Progressing toward goals    Frequency    Min 5X/week      PT Plan Current plan remains appropriate    Co-evaluation              AM-PAC PT "6 Clicks" Daily Activity  Outcome Measure  Difficulty turning over in bed (including adjusting bedclothes, sheets and blankets)?: A Little Difficulty moving from lying on back to sitting on the side of the bed? : Unable Difficulty sitting down on and standing up from a chair with arms (e.g., wheelchair, bedside commode, etc,.)?: Unable Help needed moving to and from a bed to chair (including a wheelchair)?: A Little Help needed walking in hospital room?: A Little Help needed climbing 3-5 steps with a railing? : A Lot 6 Click Score: 13    End of Session Equipment Utilized During Treatment: Gait belt Activity Tolerance: Patient tolerated treatment well Patient left: in chair;with call bell/phone within reach;with family/visitor present Nurse Communication: Mobility status PT Visit Diagnosis: Unsteadiness on feet (R26.81);Difficulty in walking, not elsewhere classified (R26.2)     Time: 1610-9604 PT Time Calculation (min) (ACUTE ONLY): 25 min  Charges:  $Gait Training: 23-37 mins                    G Codes:       Lyanne Co, PT  Acute Rehab Services  [phone]    [place] L Marion Seese 12/25/2017, 11:22 AM

## 2017-12-26 ENCOUNTER — Other Ambulatory Visit: Payer: Self-pay | Admitting: *Deleted

## 2017-12-26 DIAGNOSIS — I10 Essential (primary) hypertension: Secondary | ICD-10-CM | POA: Diagnosis not present

## 2017-12-26 DIAGNOSIS — S52021D Displaced fracture of olecranon process without intraarticular extension of right ulna, subsequent encounter for closed fracture with routine healing: Secondary | ICD-10-CM | POA: Diagnosis not present

## 2017-12-26 LAB — PTH, INTACT AND CALCIUM
CALCIUM TOTAL (PTH): 8.4 mg/dL — AB (ref 8.7–10.3)
PTH: 44 pg/mL (ref 15–65)

## 2017-12-26 LAB — CALCITRIOL (1,25 DI-OH VIT D): Vit D, 1,25-Dihydroxy: 46 pg/mL (ref 19.9–79.3)

## 2017-12-26 NOTE — Patient Outreach (Signed)
Made aware by Brianna Reynolds with [facility] that Brianna Reynolds enrolled in the [facility] First program. Writer advised to contact daughter- Brianna Reynolds to discuss potential [facility] Care Management needs.  Spoke with daughter, Brianna Reynolds at [phone] to discuss that [facility] Care Management could assist if pharmacy or medication needs are identified while on the [facility] First program. Also discussed that [facility] Care Management could potentially follow if case management needs are identified post [facility] First.   Brianna Reynolds expresses appreciation of the call. States she and the family are very pleased with the [facility] First program.  Notification sent to [facility] Care Management office to make aware of [facility] First enrollment.   Brianna Noble, MSN-Ed, RN,BSN [facility] Liaison [phone]

## 2017-12-27 ENCOUNTER — Encounter: Payer: Self-pay | Admitting: Family Medicine

## 2017-12-27 DIAGNOSIS — I10 Essential (primary) hypertension: Secondary | ICD-10-CM | POA: Diagnosis not present

## 2017-12-27 DIAGNOSIS — S52021D Displaced fracture of olecranon process without intraarticular extension of right ulna, subsequent encounter for closed fracture with routine healing: Secondary | ICD-10-CM | POA: Diagnosis not present

## 2017-12-29 DIAGNOSIS — I10 Essential (primary) hypertension: Secondary | ICD-10-CM | POA: Diagnosis not present

## 2017-12-29 DIAGNOSIS — S52021D Displaced fracture of olecranon process without intraarticular extension of right ulna, subsequent encounter for closed fracture with routine healing: Secondary | ICD-10-CM | POA: Diagnosis not present

## 2018-01-02 DIAGNOSIS — S52021D Displaced fracture of olecranon process without intraarticular extension of right ulna, subsequent encounter for closed fracture with routine healing: Secondary | ICD-10-CM | POA: Diagnosis not present

## 2018-01-02 DIAGNOSIS — I10 Essential (primary) hypertension: Secondary | ICD-10-CM | POA: Diagnosis not present

## 2018-01-03 DIAGNOSIS — S52031D Displaced fracture of olecranon process with intraarticular extension of right ulna, subsequent encounter for closed fracture with routine healing: Secondary | ICD-10-CM | POA: Diagnosis not present

## 2018-01-04 DIAGNOSIS — S52021D Displaced fracture of olecranon process without intraarticular extension of right ulna, subsequent encounter for closed fracture with routine healing: Secondary | ICD-10-CM | POA: Diagnosis not present

## 2018-01-04 DIAGNOSIS — I10 Essential (primary) hypertension: Secondary | ICD-10-CM | POA: Diagnosis not present

## 2018-01-05 ENCOUNTER — Telehealth: Payer: Self-pay | Admitting: Family Medicine

## 2018-01-05 DIAGNOSIS — S52021D Displaced fracture of olecranon process without intraarticular extension of right ulna, subsequent encounter for closed fracture with routine healing: Secondary | ICD-10-CM | POA: Diagnosis not present

## 2018-01-05 DIAGNOSIS — I10 Essential (primary) hypertension: Secondary | ICD-10-CM | POA: Diagnosis not present

## 2018-01-05 NOTE — Telephone Encounter (Signed)
Copied from [place] [id number]. Topic: General - Other >> Jan 05, 2018 10:44 AM Valla Leaver wrote: Reason for CRM: Timmothy Sours, OT with [facility], calling to notify Dr. Brigitte Pulse the patient needs OT 1x wk for 2wks, 0x a wk for 1wk, 1x a wk for 1 wk, 0x a wk for 1wk,  and 1x a wk for 1wk for ADL transfers, therapeutic exercise and patient/caregiver education on in-home exercise programs as per Dr. Marcelino [facility] orders. Need an okay to receive orders from Dr. Winifred Olive. Patient will dischagre when goals are met or at maximum potential.

## 2018-01-05 NOTE — Telephone Encounter (Signed)
Verbal given 

## 2018-01-08 DIAGNOSIS — I10 Essential (primary) hypertension: Secondary | ICD-10-CM | POA: Diagnosis not present

## 2018-01-08 DIAGNOSIS — S52021D Displaced fracture of olecranon process without intraarticular extension of right ulna, subsequent encounter for closed fracture with routine healing: Secondary | ICD-10-CM | POA: Diagnosis not present

## 2018-01-09 DIAGNOSIS — I10 Essential (primary) hypertension: Secondary | ICD-10-CM | POA: Diagnosis not present

## 2018-01-09 DIAGNOSIS — S52021D Displaced fracture of olecranon process without intraarticular extension of right ulna, subsequent encounter for closed fracture with routine healing: Secondary | ICD-10-CM | POA: Diagnosis not present

## 2018-01-10 DIAGNOSIS — I10 Essential (primary) hypertension: Secondary | ICD-10-CM | POA: Diagnosis not present

## 2018-01-10 DIAGNOSIS — S52021D Displaced fracture of olecranon process without intraarticular extension of right ulna, subsequent encounter for closed fracture with routine healing: Secondary | ICD-10-CM | POA: Diagnosis not present

## 2018-01-12 DIAGNOSIS — I10 Essential (primary) hypertension: Secondary | ICD-10-CM | POA: Diagnosis not present

## 2018-01-12 DIAGNOSIS — S52021D Displaced fracture of olecranon process without intraarticular extension of right ulna, subsequent encounter for closed fracture with routine healing: Secondary | ICD-10-CM | POA: Diagnosis not present

## 2018-01-17 DIAGNOSIS — S52031D Displaced fracture of olecranon process with intraarticular extension of right ulna, subsequent encounter for closed fracture with routine healing: Secondary | ICD-10-CM | POA: Diagnosis not present

## 2018-01-17 DIAGNOSIS — I10 Essential (primary) hypertension: Secondary | ICD-10-CM | POA: Diagnosis not present

## 2018-01-17 DIAGNOSIS — S52021D Displaced fracture of olecranon process without intraarticular extension of right ulna, subsequent encounter for closed fracture with routine healing: Secondary | ICD-10-CM | POA: Diagnosis not present

## 2018-01-23 DIAGNOSIS — I10 Essential (primary) hypertension: Secondary | ICD-10-CM | POA: Diagnosis not present

## 2018-01-23 DIAGNOSIS — S52021D Displaced fracture of olecranon process without intraarticular extension of right ulna, subsequent encounter for closed fracture with routine healing: Secondary | ICD-10-CM | POA: Diagnosis not present

## 2018-01-25 ENCOUNTER — Telehealth: Payer: Self-pay | Admitting: Family Medicine

## 2018-01-25 DIAGNOSIS — S52021D Displaced fracture of olecranon process without intraarticular extension of right ulna, subsequent encounter for closed fracture with routine healing: Secondary | ICD-10-CM | POA: Diagnosis not present

## 2018-01-25 DIAGNOSIS — I10 Essential (primary) hypertension: Secondary | ICD-10-CM | POA: Diagnosis not present

## 2018-01-25 NOTE — Telephone Encounter (Signed)
Copied from CRM [phone]#[id number]. Topic: Quick Communication - See Telephone Encounter >> Jan 25, 2018  9:01 AM Windy KalataMichael, Arlenne Kimbley L, NT wrote: CRM for notification. See Telephone encounter for: 01/25/18.  Jonny RuizJohn is a occupational therapist calling with [facility] and states the caregiver (husband) declines further OT services. He will be sending over orders for discharge.  Cb# [phone]

## 2018-01-25 NOTE — Telephone Encounter (Signed)
Message sent to Dr. Clelia CroftShaw re: discontinued OT services

## 2018-01-30 DIAGNOSIS — S52021D Displaced fracture of olecranon process without intraarticular extension of right ulna, subsequent encounter for closed fracture with routine healing: Secondary | ICD-10-CM | POA: Diagnosis not present

## 2018-01-30 DIAGNOSIS — I10 Essential (primary) hypertension: Secondary | ICD-10-CM | POA: Diagnosis not present

## 2018-01-31 ENCOUNTER — Other Ambulatory Visit: Payer: Self-pay | Admitting: Family Medicine

## 2018-01-31 NOTE — Telephone Encounter (Signed)
Oxybuynin refill Last Refill:08/17/17 # 90 tab 1 RF Last OV: 08/17/17 PCP: Norberto SorensonEva Shaw MD Pharmacy:[name] [address]

## 2018-02-07 DIAGNOSIS — S52031D Displaced fracture of olecranon process with intraarticular extension of right ulna, subsequent encounter for closed fracture with routine healing: Secondary | ICD-10-CM | POA: Diagnosis not present

## 2018-02-07 DIAGNOSIS — I10 Essential (primary) hypertension: Secondary | ICD-10-CM | POA: Diagnosis not present

## 2018-02-07 DIAGNOSIS — S52021D Displaced fracture of olecranon process without intraarticular extension of right ulna, subsequent encounter for closed fracture with routine healing: Secondary | ICD-10-CM | POA: Diagnosis not present

## 2018-02-15 ENCOUNTER — Ambulatory Visit (INDEPENDENT_AMBULATORY_CARE_PROVIDER_SITE_OTHER): Payer: Medicare Other

## 2018-02-15 ENCOUNTER — Ambulatory Visit (INDEPENDENT_AMBULATORY_CARE_PROVIDER_SITE_OTHER): Payer: Medicare Other | Admitting: Family Medicine

## 2018-02-15 ENCOUNTER — Encounter: Payer: Self-pay | Admitting: Family Medicine

## 2018-02-15 VITALS — BP 106/62 | HR 75 | Temp 98.5°F | Resp 16 | Ht 59.0 in | Wt 122.0 lb

## 2018-02-15 DIAGNOSIS — R05 Cough: Secondary | ICD-10-CM

## 2018-02-15 DIAGNOSIS — D649 Anemia, unspecified: Secondary | ICD-10-CM

## 2018-02-15 DIAGNOSIS — R634 Abnormal weight loss: Secondary | ICD-10-CM

## 2018-02-15 DIAGNOSIS — F0390 Unspecified dementia without behavioral disturbance: Secondary | ICD-10-CM | POA: Diagnosis not present

## 2018-02-15 DIAGNOSIS — Z9189 Other specified personal risk factors, not elsewhere classified: Secondary | ICD-10-CM

## 2018-02-15 DIAGNOSIS — E039 Hypothyroidism, unspecified: Secondary | ICD-10-CM | POA: Diagnosis not present

## 2018-02-15 DIAGNOSIS — E441 Mild protein-calorie malnutrition: Secondary | ICD-10-CM

## 2018-02-15 DIAGNOSIS — S52021S Displaced fracture of olecranon process without intraarticular extension of right ulna, sequela: Secondary | ICD-10-CM

## 2018-02-15 DIAGNOSIS — I10 Essential (primary) hypertension: Secondary | ICD-10-CM | POA: Diagnosis not present

## 2018-02-15 DIAGNOSIS — R059 Cough, unspecified: Secondary | ICD-10-CM

## 2018-02-15 LAB — POCT CBC
Granulocyte percent: 68.4 %G (ref 37–80)
HCT, POC: 44.2 % (ref 37.7–47.9)
Hemoglobin: 14.3 g/dL (ref 12.2–16.2)
LYMPH, POC: 2 (ref 0.6–3.4)
MCH, POC: 29.7 pg (ref 27–31.2)
MCHC: 32.4 g/dL (ref 31.8–35.4)
MCV: 91.5 fL (ref 80–97)
MID (CBC): 0.7 (ref 0–0.9)
MPV: 7.3 fL (ref 0–99.8)
PLATELET COUNT, POC: 313 10*3/uL (ref 142–424)
POC Granulocyte: 5.9 (ref 2–6.9)
POC LYMPH %: 23.1 % (ref 10–50)
POC MID %: 8.5 % (ref 0–12)
RBC: 4.83 M/uL (ref 4.04–5.48)
RDW, POC: 14.2 %
WBC: 8.6 10*3/uL (ref 4.6–10.2)

## 2018-02-15 NOTE — Patient Instructions (Addendum)
IF you received an x-ray today, you will receive an invoice from [facility]. Please contact [facility] at [phone] with questions or concerns regarding your invoice.   IF you received labwork today, you will receive an invoice from [facility]. Please contact [facility] at [phone] with questions or concerns regarding your invoice.   Our billing staff will not be able to assist you with questions regarding bills from these companies.  You will be contacted with the lab results as soon as they are available. The fastest way to get your results is to activate your My Chart account. Instructions are located on the last page of this paperwork. If you have not heard from [place] regarding the results in 2 weeks, please contact this office.    Failure to Thrive, Adult Failure to thrive is a group of symptoms that affect elderly adults. These symptoms include loss of appetite and weight loss. People who have this condition may do fewer and fewer activities over time. They may lose interest in being with friends or they may not want to eat or drink. This condition is not a normal part of aging. What are the causes? This condition may be caused by:  A disease, such dementia, diabetes, cancer, or lung disease.  A health problem, such as a vitamin deficiency or a heart problem.  A disorder, such as depression.  A disability.  Medicines.  Mistreatment or neglect.  In some cases, the cause may not be known. What are the signs or symptoms? Symptoms of this condition include:  Loss of more than 5% of your body weight.  Being more tired than normal after an activity.  Having trouble getting up after sitting.  Loss of appetite.  Not getting out of bed.  Not wanting to do usual activities.  Depression.  Getting infections often.  Bedsores.  Taking a long time to recover after an injury or a surgery.  Weakness.  How is this diagnosed? This condition may be  diagnosed with a physical exam. Your health care provider will ask questions about your health, behavior, and mood, such as:  Has your activity changed?  Do you seem sad?  Are your eating habits different?  Tests may also be done. They may include:  Blood tests.  Urine tests.  Imaging tests, such as X-rays, a CT scan, or MRI.  Hearing tests.  Vision tests.  Tests to check thinking ability (cognitive tests).  Activity tests to see if you can do tasks such as bathing and dressing and to see if you can move around safely.  You may be referred to a specialist. How is this treated? Treatment for this condition depends on the cause. It may involve:  Treating the cause.  Talk therapy or medicine to treat depression.  Improving diet, such as by eating more often or taking nutritional supplements.  Changing or stopping a medicine.  Physical therapy.  It often takes a team of health care providers to find the right treatment. Follow these instructions at home:  Take over-the-counter and prescription medicines only as told by your health care provider.  Eat a healthy, well-balanced diet. Make sure to get enough calories in each meal.  Be physically active. Include strength training as part of your exercise routine. A physical therapist can help to set up an exercise program that fits you.  Make sure that you are safe at home.  Make sure that you have a plan for what to do if you become  unable to make decisions for yourself. Contact a health care provider if:  You are not able to eat well.  You are not able to move around.  You feel very sad or hopeless. Get help right away if:  You have thoughts of ending your life.  You cannot eat or drink.  You do not get out of bed.  Staying at home is no longer safe.  You have a fever. This information is not intended to replace advice given to you by your health care provider. Make sure you discuss any questions you have  with your health care provider. Document Released: 10/10/2011 Document Revised: 12/24/2015 Document Reviewed: 10/13/2014 Elsevier Interactive Patient Education  2018 Elsevier Inc. High-Protein and High-Calorie Diet Eating high-protein and high-calorie foods can help you to gain weight, heal after an injury, and recover after an illness or surgery. What is my plan? The specific amount of daily protein and calories you need depends on:  Your body weight.  The reason this diet is recommended for you.  Generally, a high-protein, high-calorie diet involves:  Eating 250-500 extra calories each day.  Making sure that 10-35% of your daily calories come from protein.  Talk to your health care provider about how much protein and how many calories you need each day. Follow the diet as directed by your health care provider. What do I need to know about this diet?  Ask your health care provider if you should take a nutritional supplement.  Try to eat six small meals each day instead of three large meals.  Eat a balanced diet, including one food that is high in protein at each meal.  Keep nutritious snacks handy, such as nuts, trail mixes, dried fruit, and yogurt.  If you have kidney disease or diabetes, eating too much protein may put extra stress on your kidneys. Talk to your health care provider if you have either of those conditions. What are some high-protein foods? Grains Quinoa. Bulgur wheat. Vegetables Soybeans. Peas. Meats and Other Protein Sources Beef, pork, and poultry. Fish and seafood. Eggs. Tofu. Textured vegetable protein (TVP). Peanut butter. Nuts and seeds. Dried beans. Protein powders. Dairy Whole milk. Whole-milk yogurt. Powdered milk. Cheese. [employer]. Eggnog. Beverages High-protein supplement drinks. Soy milk. Other Protein bars. The items listed above may not be a complete list of recommended foods or beverages. Contact your dietitian for more options. What  are some high-calorie foods? Grains Pasta. Quick breads. Muffins. Pancakes. Ready-to-eat cereal. Vegetables Vegetables cooked in oil or butter. Fried potatoes. Fruits Dried fruit. Fruit leather. Canned fruit in syrup. Fruit juice. Avocados. Meats and Other Protein Sources Peanut butter. Nuts and seeds. Dairy Heavy cream. Whipped cream. Cream cheese. Sour cream. Ice cream. Custard. Pudding. Beverages Meal-replacement beverages. Nutrition shakes. Fruit juice. Sugar-sweetened soft drinks. Condiments Salad dressing. Mayonnaise. Alfredo sauce. Fruit preserves or jelly. Honey. Syrup. Sweets/Desserts Cake. Cookies. Pie. Pastries. Candy bars. Chocolate. Fats and Oils Butter or margarine. Oil. Gravy. Other Meal-replacement bars. The items listed above may not be a complete list of recommended foods or beverages. Contact your dietitian for more options. What are some tips for including high-protein and high-calorie foods in my diet?  Add whole milk, half-and-half, or heavy cream to cereal, pudding, soup, or hot cocoa.  Add whole milk to instant breakfast drinks.  Add peanut butter to oatmeal or smoothies.  Add powdered milk to baked goods, smoothies, or milkshakes.  Add powdered milk, cream, or butter to mashed potatoes.  Add cheese to cooked vegetables.  Make whole-milk yogurt parfaits. Top them with granola, fruit, or nuts.  Add cottage cheese to your fruit.  Add avocados, cheese, or both to sandwiches or salads.  Add meat, poultry, or seafood to rice, pasta, casseroles, salads, and soups.  Use mayonnaise when making egg salad, chicken salad, or tuna salad.  Use peanut butter as a topping for pretzels, celery, or crackers.  Add beans to casseroles, dips, and spreads.  Add pureed beans to sauces and soups.  Replace calorie-free drinks with calorie-containing drinks, such as milk and fruit juice. This information is not intended to replace advice given to you by your health  care provider. Make sure you discuss any questions you have with your health care provider. Document Released: 07/18/2005 Document Revised: 12/24/2015 Document Reviewed: 12/31/2013 Elsevier Interactive Patient Education  [employer]2018 Elsevier Inc.

## 2018-02-15 NOTE — Telephone Encounter (Signed)
noted 

## 2018-02-15 NOTE — Progress Notes (Signed)
Subjective:  By signing my name below, I, Stann Oresung-Kai Tsai, attest that this documentation has been prepared under the direction and in the presence of Norberto SorensonEva Kahlen Boyde, MD. Electronically Signed: Stann Oresung-Kai Tsai, Scribe. 02/15/2018 , 11:02 AM .  Patient was seen in Room 3 .   Patient ID: Brianna Kirksileen D Minium, female    DOB: 09-06-1923, [age] y.o.   MRN: [id number] Chief Complaint  Patient presents with  . Follow-up    6 month follow up   HPI Brianna Reynolds is a 84 y.o. female who presents to Primary Care at [facility] for follow up. Patient has non parathyroid hypocalcemia. Patient was hospitalized from 5/24 through 5/27, and here today for hospital follow up. She fell and fractured her right olecranon.   Her right elbow has been healing with full ROM now. [facility], and went on a boat. When they returned to dock, her walker had hit something and she fell to the side. She had surgery done, and home rehab after surgery. She had a walker with an arm rest, but now back on normal walker. She denies any falls since.   She states her bowels have been doing well as staying on fiber. She's noticed losing some weight. She does eat breakfast and supper, sometimes lunch. She's lost 10 lbs in the past year, and 5 lbs in the past 6 months. She does leg exercises at home. She denies any night sweats or abdominal distention. She denies urinary symptoms. She has had a cough worsening in frequency recently, ongoing for past 1-2 years.   She is brought in by her husband today.   Past Medical History:  Diagnosis Date  . Abnormal EKG   . Anxiety   . C. difficile colitis   . Cataract   . Closed fracture of right olecranon process 12/22/2017  . Fatigue   . HTN (hypertension)    New  . Hyperlipidemia   . Hypothyroid   . Memory loss   . Pneumonia    Past Surgical History:  Procedure Laterality Date  . BREAST SURGERY Bilateral   . COLONOSCOPY    . COLONOSCOPY N/A 10/27/2014   Procedure:  COLONOSCOPY;  Surgeon: Iva Booparl E Gessner, MD;  [facility];  Service: Endoscopy;  Laterality: N/A;  . FECAL TRANSPLANT N/A 10/27/2014  . INTRAMEDULLARY (IM) NAIL INTERTROCHANTERIC Right 12/23/2016   Procedure: RIGHT INTRAMEDULLARY (IM) NAIL INTERTROCHANTRIC;  Surgeon: Samson FredericSwinteck, Brian, MD;  Location: [facility] OR;  Service: Orthopedics;  Laterality: Right;  . ORIF ELBOW FRACTURE Right 12/23/2017   Procedure: OPEN REDUCTION INTERNAL FIXATION (ORIF) ELBOW/OLECRANON FRACTURE;  Surgeon: Myrene GalasHandy, Michael, MD;  Location: [facility] OR;  Service: Orthopedics;  Laterality: Right;  . PARATHYROIDECTOMY    . THYROIDECTOMY, PARTIAL    . TONSILLECTOMY     Prior to Admission medications   Medication Sig Start Date End Date Taking? Authorizing Provider  acetaminophen (TYLENOL) 500 MG tablet Take 2 tablets (1,000 mg total) by mouth every 6 (six) hours. 12/24/17   Montez MoritaPaul, Keith, PA-C  aspirin EC 81 MG tablet Take 81 mg by mouth daily.    [provider]  Calcium Carbonate (CALTRATE 600 PO) Take 1 tablet by mouth daily.    [provider]  Cholecalciferol (VITAMIN D-3) 1000 units CAPS Take 2,000 Units by mouth daily.    [provider]  donepezil (ARICEPT) 10 MG tablet Take 10 mg by mouth at bedtime. 07/19/16   [provider]  levothyroxine (SYNTHROID, LEVOTHROID) 88 MCG tablet TAKE  1 TABLET IN THE MORNING BEFORE BREAKFAST. 12/05/17   Sherren Mocha, MD  Melatonin 5 MG TABS Take 5 mg by mouth at bedtime.     [provider]  Multiple Vitamin (MULTIVITAMIN) capsule Take 1 capsule by mouth daily.     [provider]  Multiple Vitamins-Minerals (PRESERVISION AREDS) CAPS Take 1 capsule by mouth daily.    [provider]  oxybutynin (DITROPAN XL) 15 MG 24 hr tablet TAKE ONE TABLET AT BEDTIME. 01/31/18   Sherren Mocha, MD  polyethylene glycol [facility] Ethelene Hal) packet Take 17 g by mouth daily. 12/26/17   Alwyn Ren, MD  QUEtiapine (SEROQUEL) 25 MG tablet TAKE ONE  TABLET AT BEDTIME AS NEEDED FOR INSOMNIA-MAY REPEAT IN 1-2 HOURS IF NEEDED. Patient taking differently: Take 25-50 mg by mouth See admin instructions. Take 25 mg by mouth at bedtime for insomnia and may take an additional 25 mg if not effective 08/17/17   Sherren Mocha, MD  sertraline (ZOLOFT) 100 MG tablet Take 1 tablet (100 mg total) daily by mouth. 06/08/17   Sherren Mocha, MD  traMADol (ULTRAM) 50 MG tablet Take 1 tablet (50 mg total) by mouth every 8 (eight) hours as needed for moderate pain or severe pain. 12/24/17   Montez Morita, PA-C   Allergies  Allergen Reactions  . Penicillins Swelling and Rash    Has patient had a PCN reaction causing immediate rash, facial/tongue/throat swelling, SOB or lightheadedness with hypotension: Yes Has patient had a PCN reaction causing severe rash involving mucus membranes or skin necrosis: Unk Has patient had a PCN reaction that required hospitalization: No Has patient had a PCN reaction occurring within the last 10 years: No If all of the above answers are "NO", then may proceed with Cephalosporin use.    Family History  Problem Relation Age of Onset  . Arthritis Mother    Social History   Socioeconomic History  . Marital status: Married    Spouse name: Not on file  . Number of children: 5  . Years of education: Not on file  . Highest education level: Not on file  Occupational History  . Occupation: house wife  Social Needs  . Financial resource strain: Not on file  . Food insecurity:    Worry: Not on file    Inability: Not on file  . Transportation needs:    Medical: Not on file    Non-medical: Not on file  Tobacco Use  . Smoking status: Former Smoker    Types: Cigarettes    Last attempt to quit: 02/10/1991    Years since quitting: 27.0  . Smokeless tobacco: Never Used  Substance and Sexual Activity  . Alcohol use: No    Alcohol/week: 0.0 oz    Comment: rarely  . Drug use: No  . Sexual activity: Not on file  Lifestyle  . Physical  activity:    Days per week: Not on file    Minutes per session: Not on file  . Stress: Not on file  Relationships  . Social connections:    Talks on phone: Not on file    Gets together: Not on file    Attends religious service: Not on file    Active member of club or organization: Not on file    Attends meetings of clubs or organizations: Not on file    Relationship status: Not on file  Other Topics Concern  . Not on file  Social History Narrative  .  Not on file   Depression screen [facility] 2/9 08/17/2017 06/08/2017 03/08/2017 03/02/2017 12/15/2016  Decreased Interest 0 0 0 0 0  Down, Depressed, Hopeless 0 0 0 0 0  PHQ - 2 Score 0 0 0 0 0    Review of Systems  Constitutional: Positive for appetite change and unexpected weight change. Negative for chills, fatigue and fever.  Respiratory: Positive for cough.   Gastrointestinal: Negative for constipation, diarrhea, nausea and vomiting.  Skin: Negative for rash and wound.  Neurological: Negative for dizziness, weakness and headaches.       Objective:   Physical Exam  Constitutional: She is oriented to person, place, and time. She appears well-developed and well-nourished. No distress.  HENT:  Head: Normocephalic and atraumatic.  Eyes: Pupils are equal, round, and reactive to light. EOM are normal.  Neck: Neck supple.  Cardiovascular: Normal rate.  Pulmonary/Chest: Effort normal. No respiratory distress. She has rales (bibasilar) in the right lower field and the left lower field.  Bronchial breath sounds and bibasilar rales.   Musculoskeletal: Normal range of motion.  Neurological: She is alert and oriented to person, place, and time.  Skin: Skin is warm and dry.  Psychiatric: She has a normal mood and affect. Her behavior is normal.  Nursing note and vitals reviewed.   BP 106/62   Pulse 75   Temp 98.5 F (36.9 C) (Oral)   Resp 16   Ht 4\' 11"  (1.499 m)   Wt 122 lb (55.3 kg)   SpO2 96%   BMI 24.64 kg/m   Results for orders  placed or performed in visit on 02/15/18  POCT CBC  Result Value Ref Range   WBC 8.6 4.6 - 10.2 K/uL   Lymph, poc 2.0 0.6 - 3.4   POC LYMPH PERCENT 23.1 10 - 50 %L   MID (cbc) 0.7 0 - 0.9   POC MID % 8.5 0 - 12 %M   POC Granulocyte 5.9 2 - 6.9   Granulocyte percent 68.4 37 - 80 %G   RBC 4.83 4.04 - 5.48 M/uL   Hemoglobin 14.3 12.2 - 16.2 g/dL   HCT, POC 02.7 25.3 - 47.9 %   MCV 91.5 80 - 97 fL   MCH, POC 29.7 27 - 31.2 pg   MCHC 32.4 31.8 - 35.4 g/dL   RDW, POC 66.4 %   Platelet Count, POC 313 142 - 424 K/uL   MPV 7.3 0 - 99.8 fL   Dg Chest 2 View  Result Date: 02/15/2018 CLINICAL DATA:  Unintentional weight loss.  Worsening cough. EXAM: CHEST - 2 VIEW COMPARISON:  12/22/2017 FINDINGS: Subtle densities at the right lung base appear chronic. Otherwise, the lungs are clear. Heart and mediastinum are within normal limits. Atherosclerotic calcifications at the aortic arch. Levoscoliosis near the thoracolumbar spine. No large pleural effusions. IMPRESSION: No active cardiopulmonary disease. Aortic atherosclerosis. Electronically Signed   By: Richarda Overlie M.D.   On: 02/15/2018 11:43       Assessment & Plan:   1. Acquired hypothyroidism   2. Dementia without behavioral disturbance, unspecified dementia type   3. Closed fracture of right olecranon process, sequela   4. Essential hypertension   5. Hypocalcemia   6. Mild malnutrition (HCC)   7. Anemia, unspecified type   8. Cough   9. At high risk for aspiration   10. Unintentional weight loss of less than 10% body weight within 6 months     Orders Placed This Encounter  Procedures  .  DG Chest 2 View    Standing Status:   Future    Number of Occurrences:   1    Standing Expiration Date:   02/15/2019    Order Specific Question:   Reason for Exam (SYMPTOM  OR DIAGNOSIS REQUIRED)    Answer:   unintentional weight loss, worsening cough, insp rales throughout all lung fields, frequently aspirates    Order Specific Question:   Preferred  imaging location?    Answer:   External  . TSH  . Comprehensive metabolic panel  . POCT CBC   I personally performed the services described in this documentation, which was scribed in my presence. The recorded information has been reviewed and considered, and addended by me as needed.   Norberto Sorenson, M.D.  Primary Care at [facility] [address] [place], [place] [address] [phone] phone [phone] fax  05/19/18 11:57 PM

## 2018-02-16 LAB — COMPREHENSIVE METABOLIC PANEL
ALBUMIN: 4.1 g/dL (ref 3.2–4.6)
ALK PHOS: 63 IU/L (ref 39–117)
ALT: 8 IU/L (ref 0–32)
AST: 11 IU/L (ref 0–40)
Albumin/Globulin Ratio: 1.9 (ref 1.2–2.2)
BUN / CREAT RATIO: 11 — AB (ref 12–28)
BUN: 9 mg/dL — AB (ref 10–36)
Bilirubin Total: 0.4 mg/dL (ref 0.0–1.2)
CALCIUM: 9.7 mg/dL (ref 8.7–10.3)
CO2: 20 mmol/L (ref 20–29)
CREATININE: 0.83 mg/dL (ref 0.57–1.00)
Chloride: 105 mmol/L (ref 96–106)
GFR, EST AFRICAN AMERICAN: 71 mL/min/{1.73_m2} (ref >59)
GFR, EST NON AFRICAN AMERICAN: 62 mL/min/{1.73_m2} (ref >59)
GLUCOSE: 111 mg/dL — AB (ref 65–99)
Globulin, Total: 2.2 g/dL (ref 1.5–4.5)
Potassium: 4.2 mmol/L (ref 3.5–5.2)
Sodium: 142 mmol/L (ref 134–144)
TOTAL PROTEIN: 6.3 g/dL (ref 6.0–8.5)

## 2018-02-16 LAB — TSH: TSH: 0.3 u[IU]/mL — ABNORMAL LOW (ref 0.450–4.500)

## 2018-02-26 ENCOUNTER — Other Ambulatory Visit: Payer: Self-pay | Admitting: Family Medicine

## 2018-05-02 IMAGING — CR DG HIP (WITH OR WITHOUT PELVIS) 2-3V*R*
3 series · 3 of 3 positions shown · non-contrast
Comparison: CT 05/18/2014

CLINICAL DATA: Fall while walking to the bathroom

EXAM:
DG HIP (WITH OR WITHOUT PELVIS) 2-3V RIGHT

[w hip lat right]
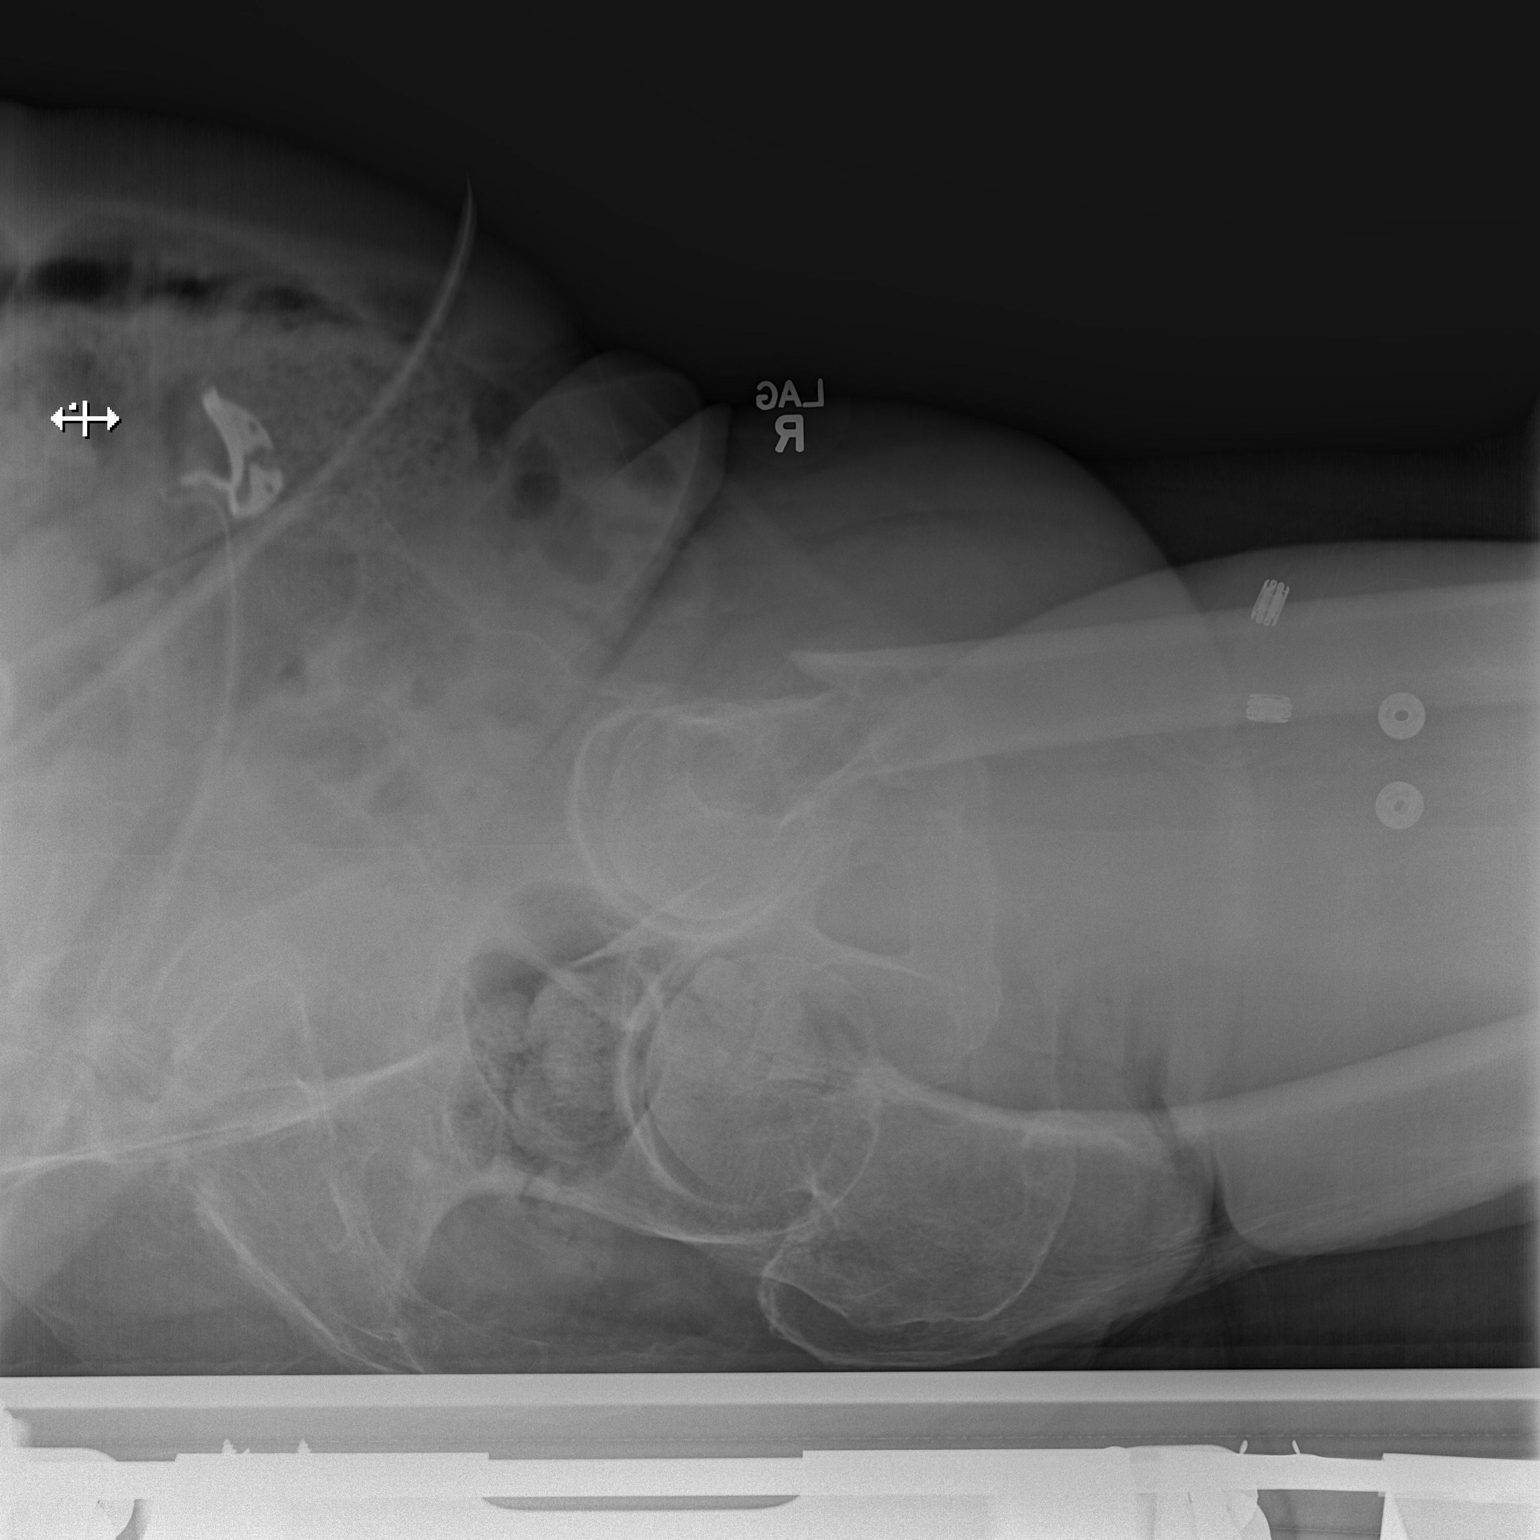

[x pelvis (1 of 2)]
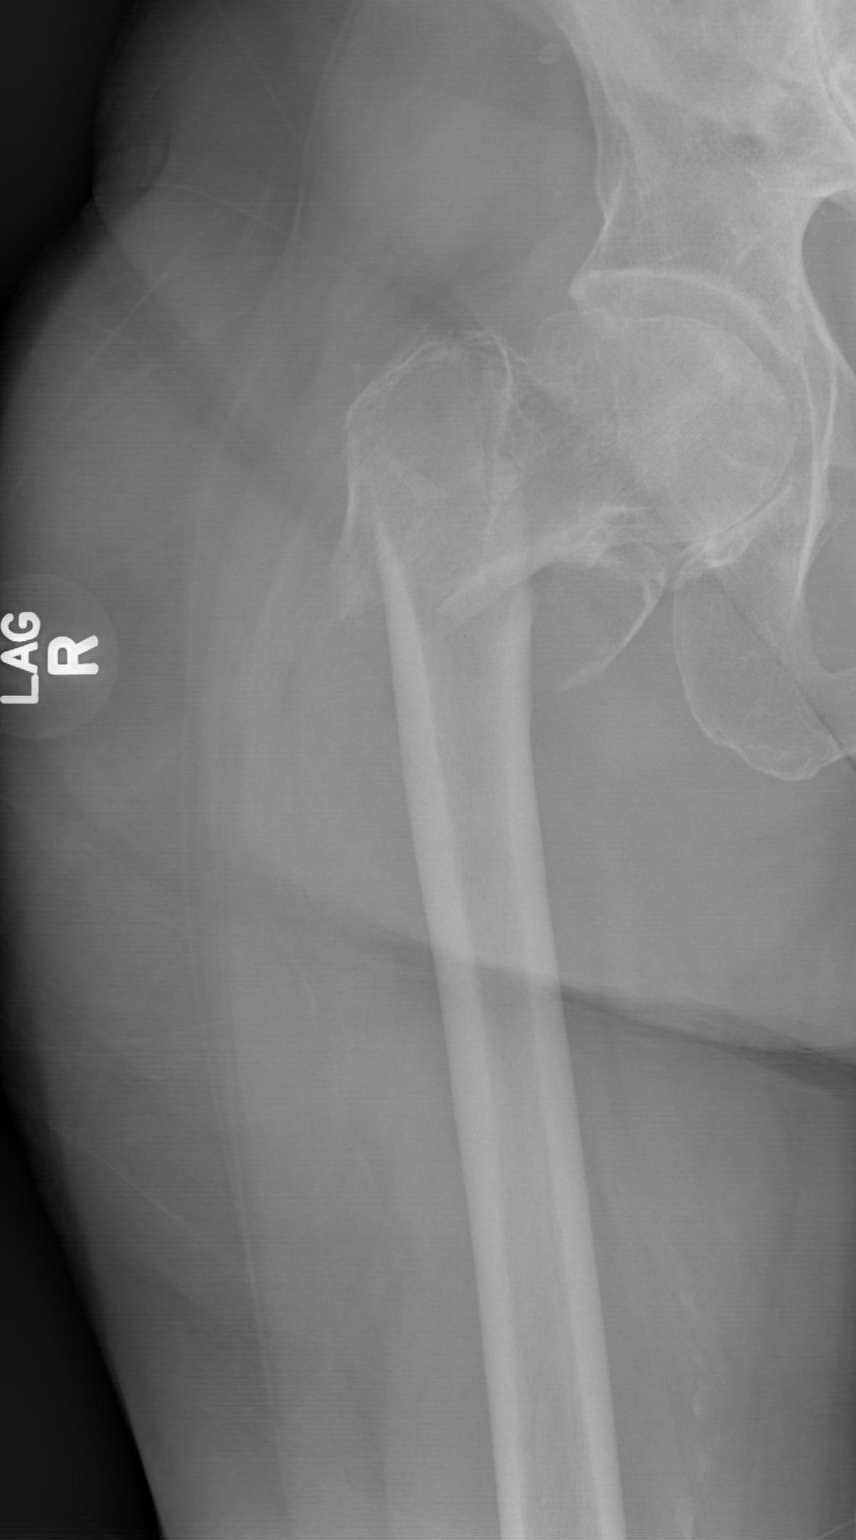

[x pelvis (2 of 2)]
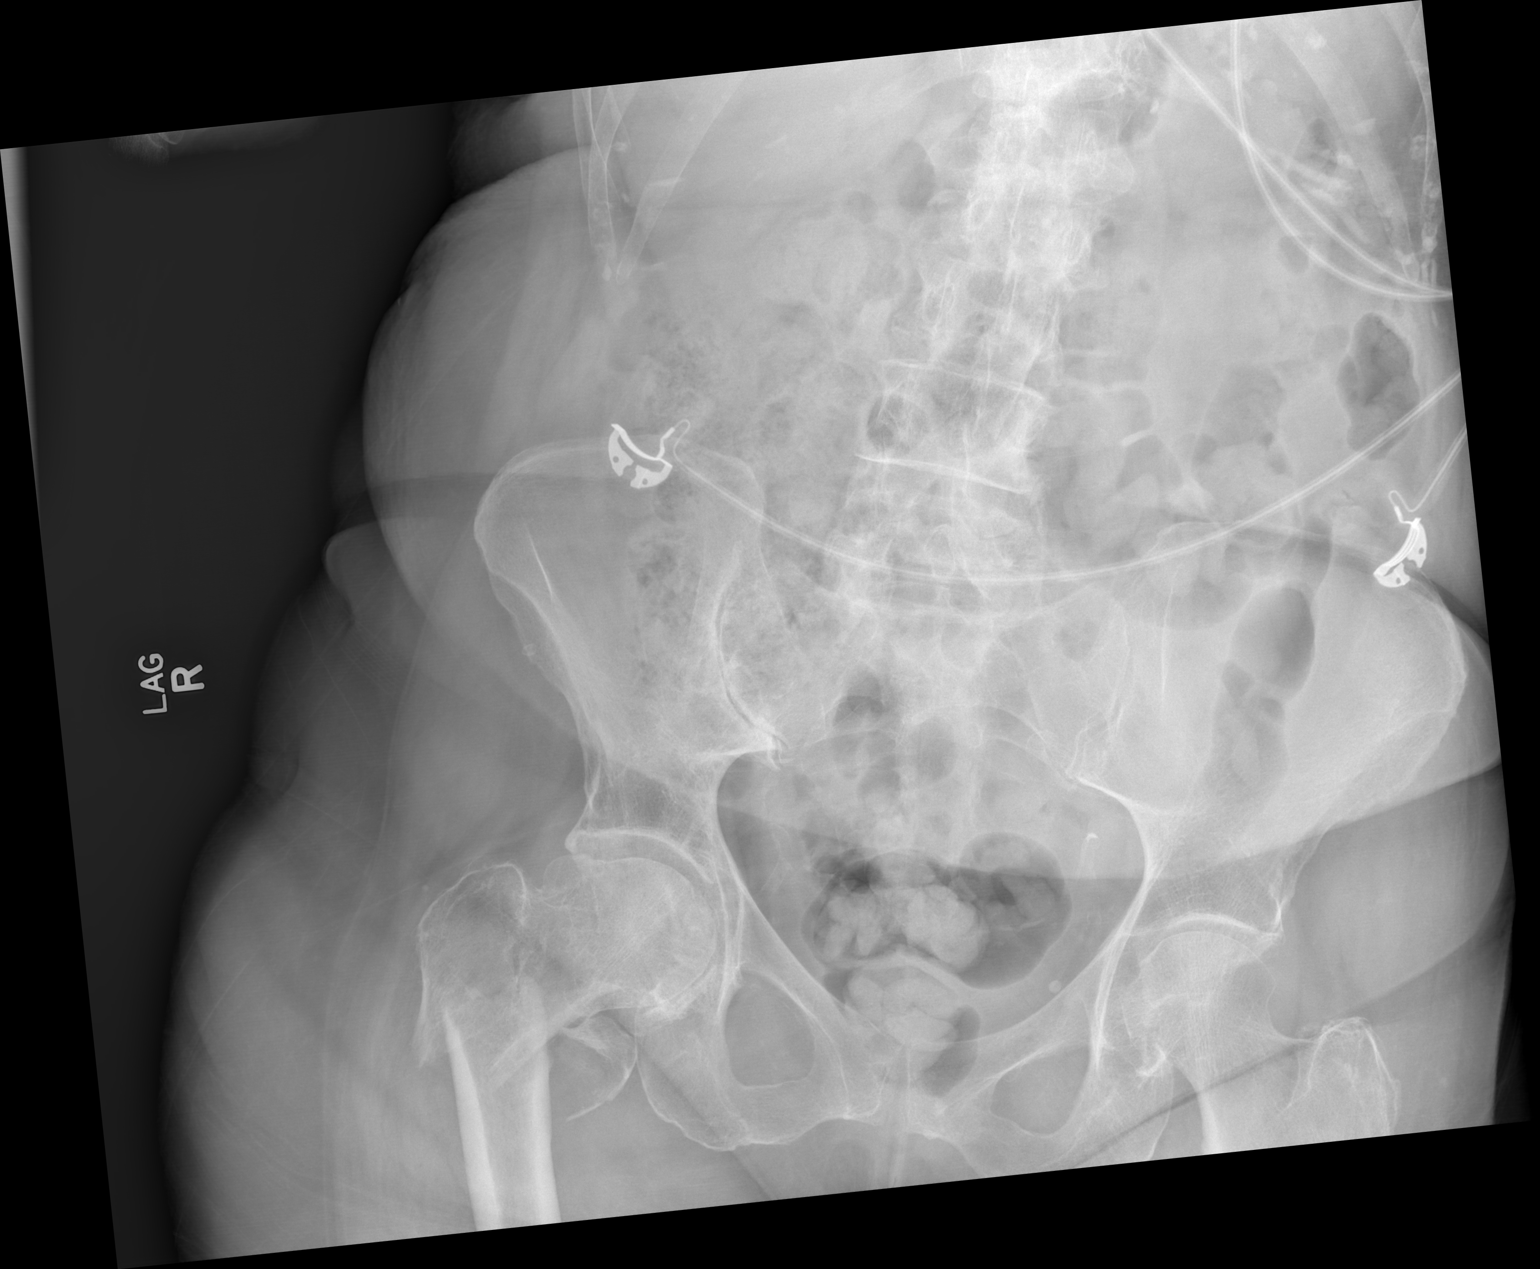

[3 of 3 positions shown; findings below may reference images not displayed]

FINDINGS: Right greater than left SI joint arthritis. The pubic symphysis
appears intact. Comminuted intertrochanteric fracture with varus
angulation and superior migration of the distal femoral fragment.
Close to [DATE] shaft diameter of anterior displacement of distal
fracture fragment on the lateral view. Displaced lesser trochanteric
fracture fragment.
IMPRESSION: Comminuted displaced and slightly angulated right intertrochanteric
fracture.

## 2018-05-02 IMAGING — CR DG FEMUR 2+V*R*
4 series · 4 of 4 positions shown · non-contrast
Comparison: None.

CLINICAL DATA: Fall with pain

EXAM:
RIGHT FEMUR 2 VIEWS

[w femur distal lat right]
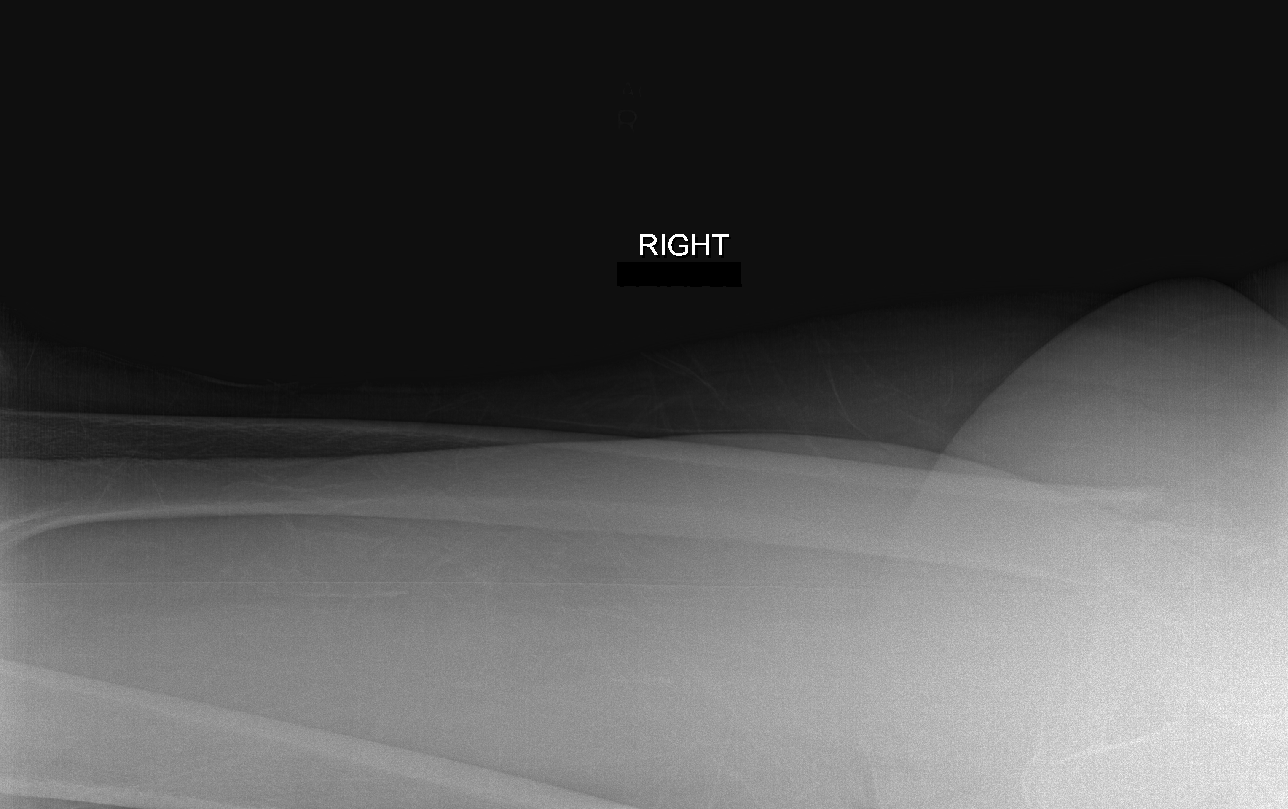

[x femur proximal ap right (1 of 2)]
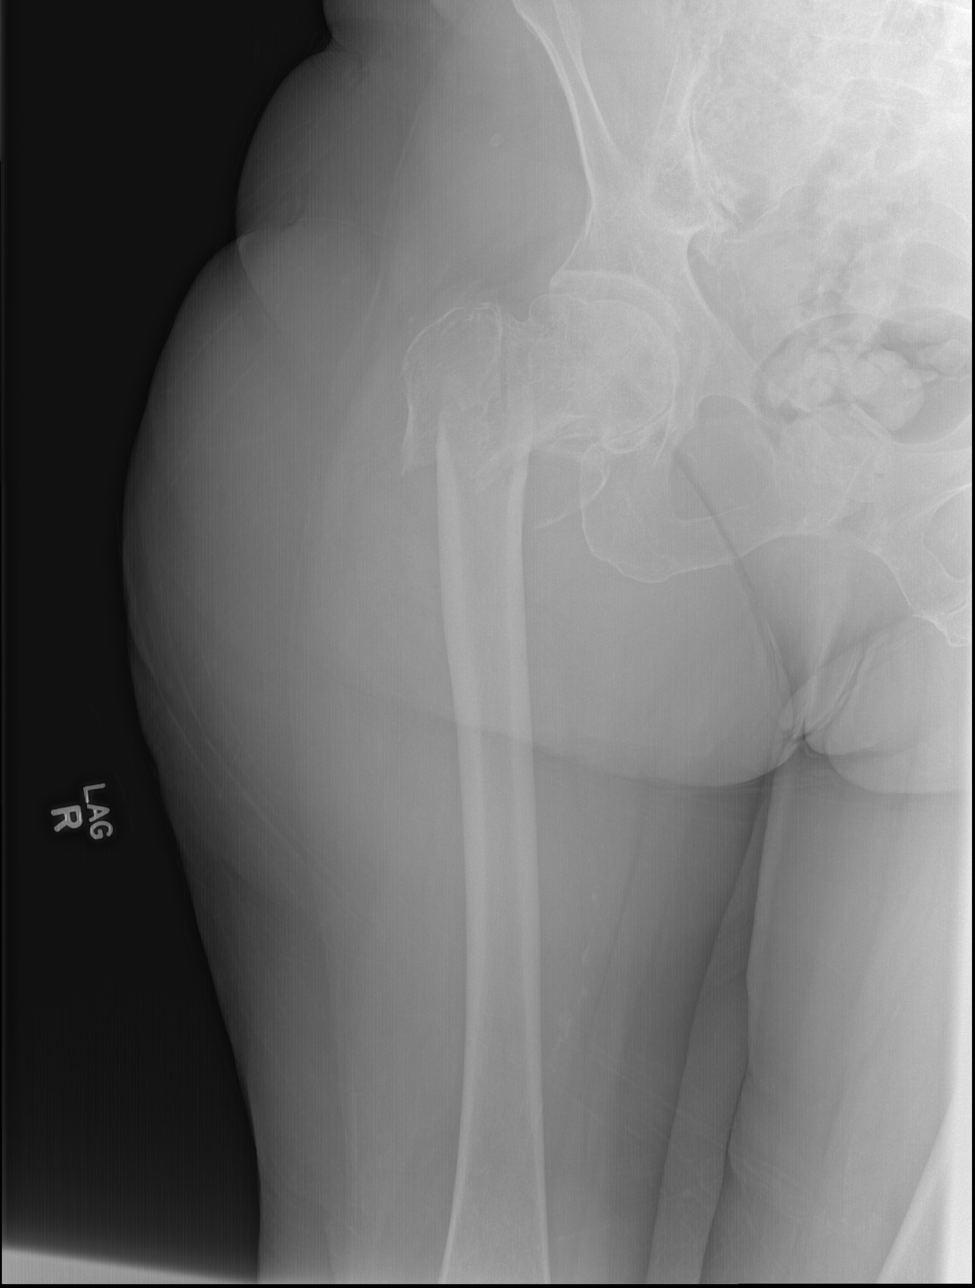

[x femur proximal ap right (2 of 2)]
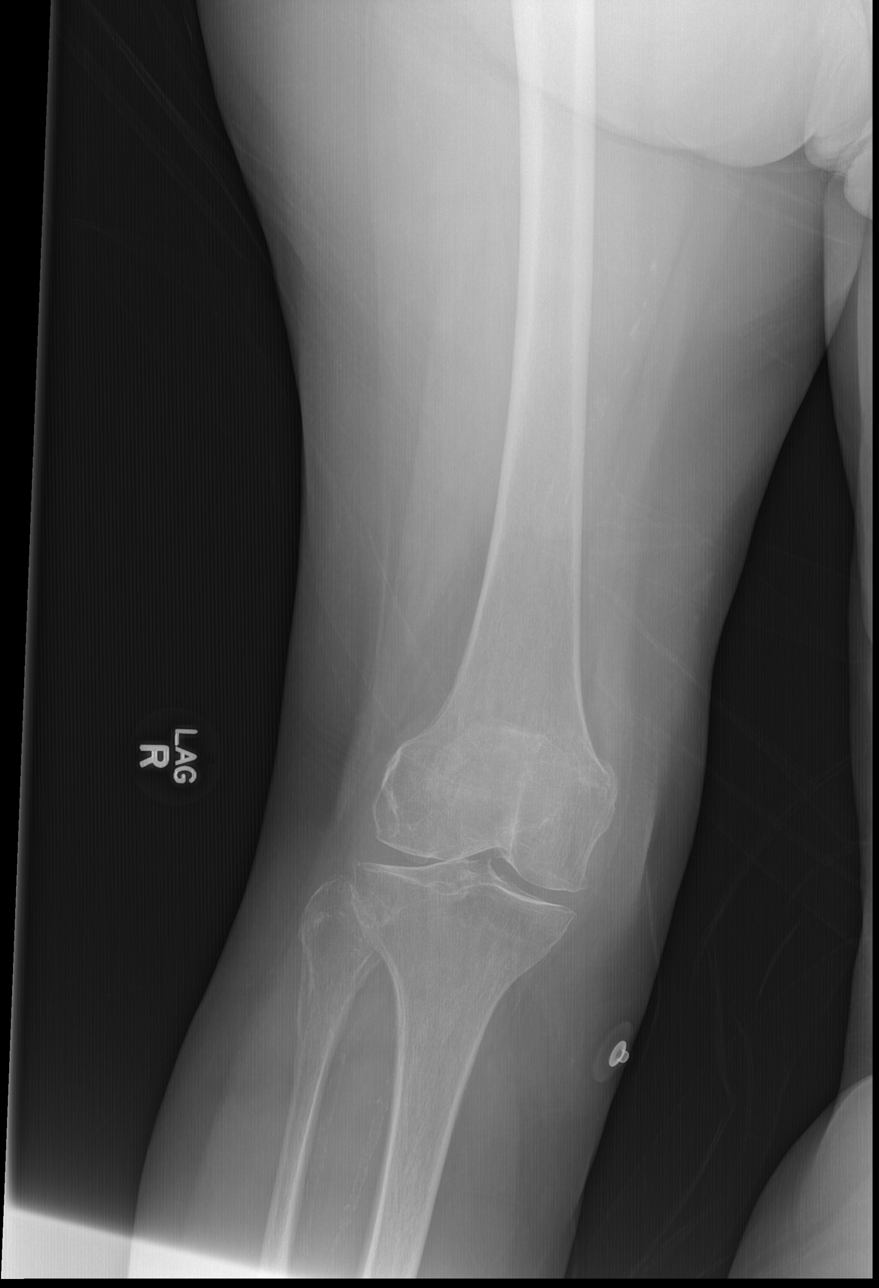

[x femur distal lat right]
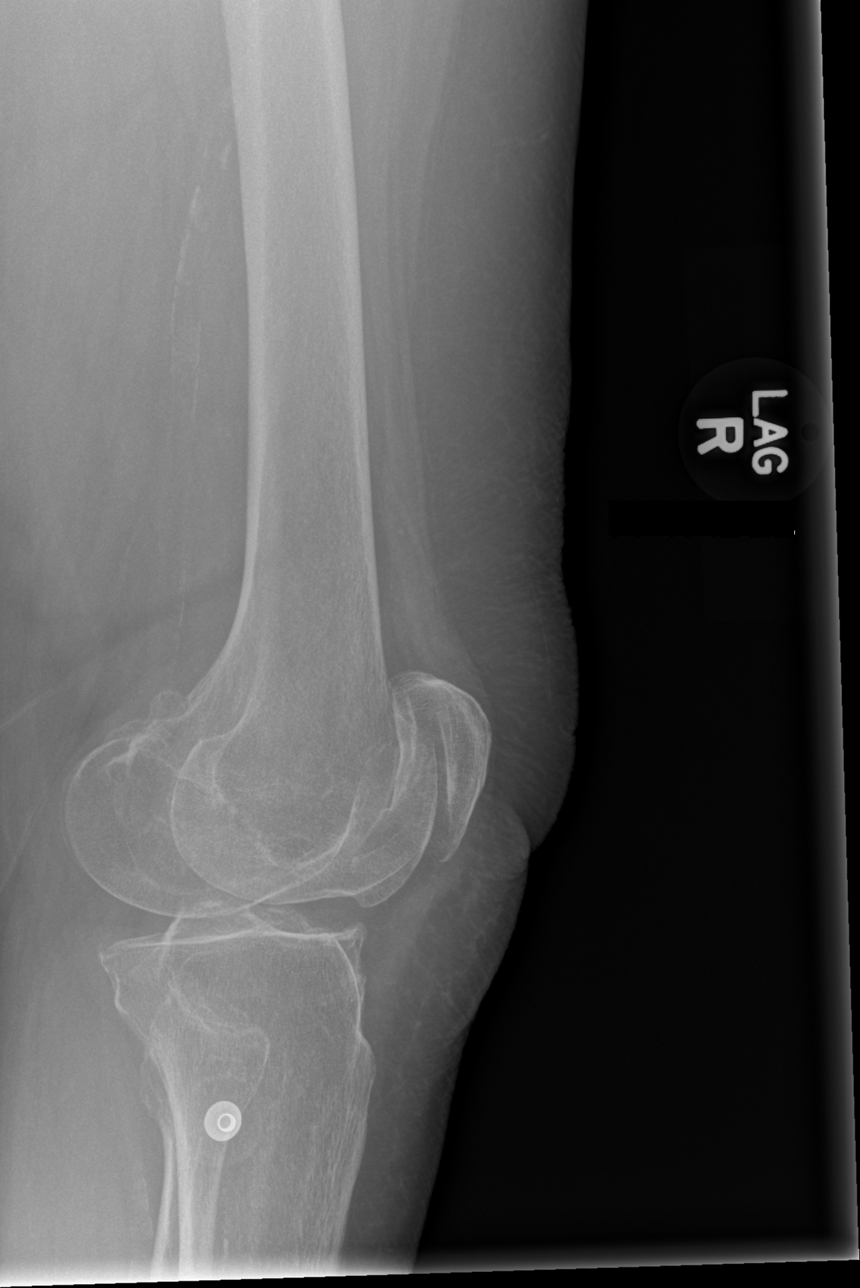

[4 of 4 positions shown; findings below may reference images not displayed]

FINDINGS: Pubic symphysis appears intact. Right femoral head projects in
joint. Comminuted and displaced proximal right femur fracture.
Distal femur is intact. Degenerative changes at the right knee.
Vascular calcifications.
IMPRESSION: Comminuted and displaced proximal femoral fracture. Distal femur
appears intact.

## 2018-06-06 DIAGNOSIS — H02834 Dermatochalasis of left upper eyelid: Secondary | ICD-10-CM | POA: Diagnosis not present

## 2018-06-06 DIAGNOSIS — H04123 Dry eye syndrome of bilateral lacrimal glands: Secondary | ICD-10-CM | POA: Diagnosis not present

## 2018-06-06 DIAGNOSIS — H353131 Nonexudative age-related macular degeneration, bilateral, early dry stage: Secondary | ICD-10-CM | POA: Diagnosis not present

## 2018-06-06 DIAGNOSIS — H02831 Dermatochalasis of right upper eyelid: Secondary | ICD-10-CM | POA: Diagnosis not present

## 2018-06-06 DIAGNOSIS — Z961 Presence of intraocular lens: Secondary | ICD-10-CM | POA: Diagnosis not present

## 2018-06-11 DIAGNOSIS — Z23 Encounter for immunization: Secondary | ICD-10-CM | POA: Diagnosis not present

## 2018-06-21 ENCOUNTER — Telehealth: Payer: Self-pay | Admitting: Family Medicine

## 2018-06-21 NOTE — Telephone Encounter (Signed)
Called and spoke with pt's husband regarding their appt with Dr. Clelia CroftShaw on 08/20/18. Due to providers schedule change, Dr. Clelia CroftShaw will be unavailable that day. I was able to get pt rescheduled for 08/21/17 at 10:00 am . I advised of time, building and late policy.

## 2018-07-02 ENCOUNTER — Other Ambulatory Visit: Payer: Self-pay | Admitting: Family Medicine

## 2018-07-03 NOTE — Telephone Encounter (Signed)
Requested Prescriptions  Pending Prescriptions Disp Refills  . sertraline (ZOLOFT) 100 MG tablet [Pharmacy Med Name: SERTRALINE HCL 100 MG TABLET] 90 tablet 0    Sig: TAKE 1 TABLET ONCE DAILY.     Psychiatry:  Antidepressants - SSRI Passed - 07/02/2018  9:07 AM      Passed - Completed PHQ-2 or PHQ-9 in the last 360 days.      Passed - Valid encounter within last 6 months    Recent Outpatient Visits          4 months ago Acquired hypothyroidism   [facility] at [facility], Levell JulyEva N, MD   10 months ago Medicare annual wellness visit, subsequent   [facility], Levell JulyEva N, MD   11 months ago Encopresis   [facility], Levell JulyEva N, MD   1 year ago Need for prophylactic vaccination and inoculation against influenza   [facility], Levell JulyEva N, MD   1 year ago Acute pain of right knee   [facility], Levell JulyEva N, MD      Future Appointments            In 1 month Sherren MochaShaw, Eva N, MD [facility], [facility]

## 2018-07-18 ENCOUNTER — Other Ambulatory Visit: Payer: Self-pay | Admitting: Family Medicine

## 2018-07-18 DIAGNOSIS — G4700 Insomnia, unspecified: Secondary | ICD-10-CM

## 2018-07-18 NOTE — Telephone Encounter (Signed)
Requested medication (s) are due for refill today -yes  Requested medication (s) are on the active medication list -yes  Future visit scheduled -yes  Last refill: 08/17/17  Notes to clinic: Patient is requesting a refill of non-delegated Rx. Request sent for provider review  Requested Prescriptions  Pending Prescriptions Disp Refills   QUEtiapine (SEROQUEL) 25 MG tablet [Pharmacy Med Name: QUETIAPINE FUMARATE 25 MG TAB] 180 tablet 0    Sig: TAKE ONE TABLET AT BEDTIME AS NEEDED FOR INSOMNIA-MAY REPEAT IN 1-2 HOURS IF NEEDED.     Not Delegated - Psychiatry:  Antipsychotics - Second Generation (Atypical) - quetiapine Failed - 07/18/2018  9:12 AM      Failed - This refill cannot be delegated      Passed - ALT in normal range and within 180 days    ALT  Date Value Ref Range Status  02/15/2018 8 0 - 32 IU/L Final         Passed - AST in normal range and within 180 days    AST  Date Value Ref Range Status  02/15/2018 11 0 - 40 IU/L Final         Passed - Completed PHQ-2 or PHQ-9 in the last 360 days.      Passed - Last BP in normal range    BP Readings from Last 1 Encounters:  02/15/18 106/62         Passed - Valid encounter within last 6 months    Recent Outpatient Visits          5 months ago Acquired hypothyroidism   [facility], Levell JulyEva N, MD   11 months ago Medicare annual wellness visit, subsequent   [facility], Levell JulyEva N, MD   11 months ago Encopresis   [facility], Levell JulyEva N, MD   1 year ago Need for prophylactic vaccination and inoculation against influenza   [facility], Levell JulyEva N, MD   1 year ago Acute pain of right knee   [facility] at [facility], Levell JulyEva N, MD      Future Appointments            In 1 month Sherren MochaShaw, Eva N, MD [facility]            Requested Prescriptions  Pending Prescriptions Disp Refills   QUEtiapine (SEROQUEL) 25 MG tablet [Pharmacy Med Name: QUETIAPINE FUMARATE 25 MG  TAB] 180 tablet 0    Sig: TAKE ONE TABLET AT BEDTIME AS NEEDED FOR INSOMNIA-MAY REPEAT IN 1-2 HOURS IF NEEDED.     Not Delegated - Psychiatry:  Antipsychotics - Second Generation (Atypical) - quetiapine Failed - 07/18/2018  9:12 AM      Failed - This refill cannot be delegated      Passed - ALT in normal range and within 180 days    ALT  Date Value Ref Range Status  02/15/2018 8 0 - 32 IU/L Final         Passed - AST in normal range and within 180 days    AST  Date Value Ref Range Status  02/15/2018 11 0 - 40 IU/L Final         Passed - Completed PHQ-2 or PHQ-9 in the last 360 days.      Passed - Last BP in normal range    BP Readings from Last 1 Encounters:  02/15/18 106/62  Passed - Valid encounter within last 6 months    Recent Outpatient Visits          5 months ago Acquired hypothyroidism   [facility] at [facility], Levell July, MD   11 months ago Medicare annual wellness visit, subsequent   [facility] at [facility], Levell July, MD   11 months ago Encopresis   [facility] at [facility], Levell July, MD   1 year ago Need for prophylactic vaccination and inoculation against influenza   Primary Care at [facility], Levell July, MD   1 year ago Acute pain of right knee   [facility] Care at [facility], Levell July, MD      Future Appointments            In 1 month Sherren Mocha, MD Primary Care at [place], [facility]

## 2018-07-19 NOTE — Telephone Encounter (Signed)
Med refill approval or deny needed 

## 2018-08-10 ENCOUNTER — Other Ambulatory Visit: Payer: Self-pay | Admitting: Family Medicine

## 2018-08-10 NOTE — Telephone Encounter (Signed)
Requested Prescriptions  Pending Prescriptions Disp Refills  . oxybutynin (DITROPAN XL) 15 MG 24 hr tablet [Pharmacy Med Name: OXYBUTYNIN CL ER 15 MG TABLET] 90 tablet 0    Sig: TAKE ONE TABLET AT BEDTIME.     Urology:  Bladder Agents Passed - 08/10/2018  8:48 AM      Passed - Valid encounter within last 12 months    Recent Outpatient Visits          5 months ago Acquired hypothyroidism   [facility] at [facility], Levell July, MD   11 months ago Medicare annual wellness visit, subsequent   [facility] at [facility], Levell July, MD   1 year ago Encopresis   [facility] at [facility], Levell July, MD   1 year ago Need for prophylactic vaccination and inoculation against influenza   Primary Care at [facility], Levell July, MD   1 year ago Acute pain of right knee   [facility] Care at [facility], Levell July, MD      Future Appointments            In 1 week Sherren Mocha, MD Primary Care at [place], [facility]

## 2018-08-20 ENCOUNTER — Ambulatory Visit: Payer: Medicare Other | Admitting: Family Medicine

## 2018-08-21 ENCOUNTER — Ambulatory Visit: Payer: Medicare Other | Admitting: Family Medicine

## 2018-08-27 ENCOUNTER — Other Ambulatory Visit: Payer: Self-pay | Admitting: Family Medicine

## 2018-08-27 NOTE — Telephone Encounter (Signed)
Requested medication (s) are due for refill today: not specified  Requested medication (s) are on the active medication list: yes    Last refill: 12/22/17  Future visit scheduled yes 10/03/2018  Notes to clinic:historical provider  Requested Prescriptions  Pending Prescriptions Disp Refills   donepezil (ARICEPT) 10 MG tablet [Pharmacy Med Name: DONEPEZIL HCL 10 MG TABLET] 90 tablet 0    Sig: TAKE ONE TABLET AT BEDTIME.     Neurology:  Alzheimer's Agents Failed - 08/27/2018 10:20 AM      Failed - Valid encounter within last 6 months    Recent Outpatient Visits          6 months ago Acquired hypothyroidism   [facility] at [facility], Levell July, MD   1 year ago Medicare annual wellness visit, subsequent   Primary Care at [facility], Levell July, MD   1 year ago Encopresis   [facility] at [facility], Levell July, MD   1 year ago Need for prophylactic vaccination and inoculation against influenza   Primary Care at [facility], Levell July, MD   1 year ago Acute pain of right knee   Primary Care at [facility], Levell July, MD      Future Appointments            In 1 month Sherren Mocha, MD Primary Care at [place], [facility]         Signed Prescriptions Disp Refills   levothyroxine (SYNTHROID, LEVOTHROID) 88 MCG tablet 90 tablet 0    Sig: TAKE 1 TABLET IN THE MORNING BEFORE BREAKFAST.     Endocrinology:  Hypothyroid Agents Failed - 08/27/2018 10:20 AM      Failed - TSH needs to be rechecked within 3 months after an abnormal result. Refill until TSH is due.      Failed - TSH in normal range and within 360 days    TSH  Date Value Ref Range Status  02/15/2018 0.300 (L) 0.450 - 4.500 uIU/mL Final         Passed - Valid encounter within last 12 months    Recent Outpatient Visits          6 months ago Acquired hypothyroidism   [facility] Care at [facility], Levell July, MD   1 year ago Medicare annual wellness visit, subsequent   Primary Care at [facility], Levell July, MD   1 year ago Encopresis   Primary Care at  [facility], Levell July, MD   1 year ago Need for prophylactic vaccination and inoculation against influenza   Primary Care at [facility], Levell July, MD   1 year ago Acute pain of right knee   Primary Care at [facility], Levell July, MD      Future Appointments            In 1 month Sherren Mocha, MD Primary Care at [place], [facility]

## 2018-08-28 NOTE — Telephone Encounter (Signed)
Error on spelling Donepezil HCI 10 mg

## 2018-08-28 NOTE — Telephone Encounter (Signed)
Medication request for Donacept no refills on file. Pt appointment 10/03/2018 @ 10:30 am

## 2018-08-29 ENCOUNTER — Other Ambulatory Visit: Payer: Self-pay | Admitting: Family Medicine

## 2018-08-29 MED ORDER — OXYBUTYNIN CHLORIDE ER 15 MG PO TB24: 15.0000 mg | ORAL_TABLET | Freq: Every day | ORAL | 0 refills | Status: DC

## 2018-08-29 NOTE — Telephone Encounter (Signed)
Copied from CRM [id number]. Topic: Quick Communication - Rx Refill/Question >> Aug 29, 2018 10:49 AM Jilda Roche wrote: Medication: oxybutynin (DITROPAN XL) 15 MG 24   Has the patient contacted their pharmacy? Yes.   (Agent: If no, request that the patient contact the pharmacy for the refill.) (Agent: If yes, when and what did the pharmacy advise?) Did not receive escribe   Preferred Pharmacy (with phone number or street name): [facility] - [place], [place] - [address] [phone] (Phone) [phone] (Fax)    Agent: Please be advised that RX refills may take up to 3 business days. We ask that you follow-up with your pharmacy.

## 2018-09-27 ENCOUNTER — Other Ambulatory Visit: Payer: Self-pay | Admitting: Family Medicine

## 2018-09-27 NOTE — Telephone Encounter (Signed)
30 day courtesy refill. NOV 10/03/18 with Dr. Clelia Croft.

## 2018-09-28 ENCOUNTER — Telehealth: Payer: Self-pay | Admitting: General Practice

## 2018-09-28 NOTE — Telephone Encounter (Signed)
Called pt. and informed them of Dr. Shaw’s permanent absence and cancelation of their appt with her. Gave Dr. Shaw’s new practice and Primary [facility] at [facility]’s.  °

## 2018-10-03 ENCOUNTER — Ambulatory Visit: Payer: Medicare Other | Admitting: Family Medicine

## 2018-11-05 ENCOUNTER — Ambulatory Visit: Payer: Medicare Other | Admitting: Emergency Medicine

## 2018-12-04 ENCOUNTER — Other Ambulatory Visit: Payer: Self-pay | Admitting: Emergency Medicine

## 2018-12-04 NOTE — Telephone Encounter (Signed)
Requested Prescriptions  Pending Prescriptions Disp Refills  . donepezil (ARICEPT) 10 MG tablet [Pharmacy Med Name: DONEPEZIL HCL 10 MG TABLET] 60 tablet 0    Sig: TAKE ONE TABLET AT BEDTIME.     Neurology:  Alzheimer's Agents Failed - 12/04/2018 10:41 AM      Failed - Valid encounter within last 6 months    Recent Outpatient Visits          9 months ago Acquired hypothyroidism   [facility] Care at [facility], Levell July, MD   1 year ago Medicare annual wellness visit, subsequent   Primary Care at [facility], Levell July, MD   1 year ago Encopresis   [facility] Care at [facility], Levell July, MD   1 year ago Need for prophylactic vaccination and inoculation against influenza   Primary Care at [facility], Levell July, MD   1 year ago Acute pain of right knee   Primary Care at [facility], Levell July, MD      Future Appointments            In 1 month Sagardia, Eilleen Kempf, MD Primary Care at [place], [facility]         . levothyroxine (SYNTHROID) 88 MCG tablet [Pharmacy Med Name: LEVOTHYROXINE 88 MCG TABLET] 90 tablet 0    Sig: TAKE 1 TABLET IN THE MORNING BEFORE BREAKFAST.     Endocrinology:  Hypothyroid Agents Failed - 12/04/2018 10:41 AM      Failed - TSH needs to be rechecked within 3 months after an abnormal result. Refill until TSH is due.      Failed - TSH in normal range and within 360 days    TSH  Date Value Ref Range Status  02/15/2018 0.300 (L) 0.450 - 4.500 uIU/mL Final         Passed - Valid encounter within last 12 months    Recent Outpatient Visits          9 months ago Acquired hypothyroidism   [facility] Care at [facility], Levell July, MD   1 year ago Medicare annual wellness visit, subsequent   Primary Care at [facility], Levell July, MD   1 year ago Encopresis   [facility] Care at [facility], Levell July, MD   1 year ago Need for prophylactic vaccination and inoculation against influenza   Primary Care at [facility], Levell July, MD   1 year ago Acute pain of right knee   Primary Care at [facility], Levell July, MD       Future Appointments            In 1 month Sagardia, Eilleen Kempf, MD Primary Care at [place], [facility]

## 2018-12-06 ENCOUNTER — Telehealth: Payer: Medicare Other | Admitting: Family Medicine

## 2018-12-06 ENCOUNTER — Other Ambulatory Visit: Payer: Self-pay

## 2018-12-06 ENCOUNTER — Telehealth (INDEPENDENT_AMBULATORY_CARE_PROVIDER_SITE_OTHER): Payer: Medicare Other | Admitting: Family Medicine

## 2018-12-06 DIAGNOSIS — L720 Epidermal cyst: Secondary | ICD-10-CM

## 2018-12-06 MED ORDER — DOXYCYCLINE HYCLATE 100 MG PO TABS
100.0000 mg | ORAL_TABLET | Freq: Two times a day (BID) | ORAL | 0 refills | Status: DC
Start: 2018-12-06 — End: 2019-01-08

## 2018-12-06 MED ORDER — ERYTHROMYCIN 5 MG/GM OP OINT
TOPICAL_OINTMENT | OPHTHALMIC | 0 refills | Status: AC
Start: 2018-12-06 — End: ?

## 2018-12-06 NOTE — Progress Notes (Signed)
Left eye redness and possible stye. Started this morning. Pt is ready in [email or web address]

## 2018-12-06 NOTE — Progress Notes (Signed)
Telemedicine Encounter- SOAP NOTE Established Patient  I discussed the limitations, risks, security and privacy concerns of performing an evaluation and management service by telephone and the availability of in person appointments. I also discussed with the patient that there may be a patient responsible charge related to this service. The patient expressed understanding and agreed to proceed.  This telephone encounter was conducted with the patient and her daughter  verbal consent via audio telecommunications: yes Patient was instructed to have this encounter in a suitably private space-pt at home; and to only have persons present to whom they give permission to participate-care giver present with pt. In addition, patient identity was confirmed by use of name plus two identifiers (DOB and address).  I spent a total of 10min talking with the patient or their proxy.   Subjective   Pt with left eye irritation noted this morning. Concern for redness getting worse. No drainage   Brianna Reynolds is a 85 y.o.  female established patient. Telephone visit today for left eye redness. Daughter worried about eye infection-webx visit. No pain, no drainage,pt lives with caregiver at home. No fever, cough or sore throat    Patient Active Problem List   Diagnosis Date Noted  . Closed fracture of right olecranon process 12/22/2017  . Depression 12/22/2017  . Elbow fracture 12/22/2017  . Closed comminuted intertrochanteric fracture of proximal end of right femur (HCC) 12/23/2016  . Closed right hip fracture (HCC) 12/22/2016  . Fall at home, initial encounter 12/22/2016  . Dementia (HCC) 12/22/2016  . Amnesia 01/08/2015  . Cerebrovascular small vessel disease 01/08/2015  . Idiopathic peripheral neuropathy 01/08/2015  . Atherosclerotic cerebrovascular disease 01/08/2015  . Cough   . Recurrent Clostridium difficile diarrhea 06/13/2014  . Leukocytosis 06/13/2014  . Dehydration 06/13/2014  .  Hypothyroid   . Insomnia 02/28/2013  . Abnormal serum protein electrophoresis 02/11/2013  . Abnormal EKG   . HTN (hypertension)   . Fatigue     Past Medical History:  Diagnosis Date  . Abnormal EKG   . Anxiety   . C. difficile colitis   . Cataract   . Closed fracture of right olecranon process 12/22/2017  . Fatigue   . HTN (hypertension)    New  . Hyperlipidemia   . Hypothyroid   . Memory loss   . Pneumonia     Current Outpatient Medications  Medication Sig Dispense Refill  . aspirin EC 81 MG tablet Take 81 mg by mouth daily.    . Calcium Carbonate (CALTRATE 600 PO) Take 1 tablet by mouth daily.    . Cholecalciferol (VITAMIN D-3) 1000 units CAPS Take 2,000 Units by mouth daily.    Marland Kitchen. donepezil (ARICEPT) 10 MG tablet TAKE ONE TABLET AT BEDTIME. 60 tablet 0  . levothyroxine (SYNTHROID) 88 MCG tablet TAKE 1 TABLET IN THE MORNING BEFORE BREAKFAST. 60 tablet 0  . Melatonin 5 MG TABS Take 5 mg by mouth at bedtime.     . Multiple Vitamin (MULTIVITAMIN) capsule Take 1 capsule by mouth daily.     . Multiple Vitamins-Minerals (PRESERVISION AREDS) CAPS Take 1 capsule by mouth daily.    Marland Kitchen. oxybutynin (DITROPAN XL) 15 MG 24 hr tablet Take 1 tablet (15 mg total) by mouth at bedtime. 90 tablet 0  . polyethylene glycol (MIRALAX / GLYCOLAX) packet Take 17 g by mouth daily. 14 each 0  . QUEtiapine (SEROQUEL) 25 MG tablet TAKE ONE TABLET AT BEDTIME AS NEEDED FOR INSOMNIA-MAY REPEAT IN 1-2 HOURS  IF NEEDED. 180 tablet 1  . traMADol (ULTRAM) 50 MG tablet Take 1 tablet (50 mg total) by mouth every 8 (eight) hours as needed for moderate pain or severe pain. 30 tablet 0  . acetaminophen (TYLENOL) 500 MG tablet Take 2 tablets (1,000 mg total) by mouth every 6 (six) hours. (Patient not taking: Reported on 12/06/2018) 30 tablet 0  . sertraline (ZOLOFT) 100 MG tablet TAKE 1 TABLET ONCE DAILY. (Patient not taking: Reported on 12/06/2018) 30 tablet 0   No current facility-administered medications for this visit.      Allergies  Allergen Reactions  . Penicillins Swelling and Rash    Has patient had a PCN reaction causing immediate rash, facial/tongue/throat swelling, SOB or lightheadedness with hypotension: Yes Has patient had a PCN reaction causing severe rash involving mucus membranes or skin necrosis: Unk Has patient had a PCN reaction that required hospitalization: No Has patient had a PCN reaction occurring within the last 10 years: No If all of the above answers are "NO", then may proceed with Cephalosporin use.     Social History   Socioeconomic History  . Marital status: Married    Spouse name: Not on file  . Number of children: 5  . Years of education: Not on file  . Highest education level: Not on file  Occupational History  . Occupation: house wife  Social Needs  . Financial resource strain: Not on file  . Food insecurity:    Worry: Not on file    Inability: Not on file  . Transportation needs:    Medical: Not on file    Non-medical: Not on file  Tobacco Use  . Smoking status: Former Smoker    Types: Cigarettes    Last attempt to quit: 02/10/1991    Years since quitting: 27.8  . Smokeless tobacco: Never Used  Substance and Sexual Activity  . Alcohol use: No    Alcohol/week: 0.0 standard drinks    Comment: rarely  . Drug use: No  . Sexual activity: Not on file  Lifestyle  . Physical activity:    Days per week: Not on file    Minutes per session: Not on file  . Stress: Not on file  Relationships  . Social connections:    Talks on phone: Not on file    Gets together: Not on file    Attends religious service: Not on file    Active member of club or organization: Not on file    Attends meetings of clubs or organizations: Not on file    Relationship status: Not on file  . Intimate partner violence:    Fear of current or ex partner: Not on file    Emotionally abused: Not on file    Physically abused: Not on file    Forced sexual activity: Not on file  Other  Topics Concern  . Not on file  Social History Narrative  . Not on file    ROS CONSTITUTIONAL: nofever, EENT:no  sinus problems,no nasal congestion- facial tenderness lateral to left eye RESP: no SOB, no cough, INTEG: , itching with new skin lesion noted and eyth associated. No drainage. No lesions on the eye lids. Objective   Vitals as reported by the patient:NONE webx-visualized lesionand talked with pt and daughter and care giver  PE WDWN elderly female pt sitting comfortably as noted on video monitor.No SOB EYES-bilat non injected-inclusion cyst noted lateral left eye with associated eryth, no swelling. No eryth of lid,no lesions bilat  1. Inclusion cyst Concern for infection-likely due to manipulation of area -doxy-rx, mild eryth noted under left lower lid-eryth ointment-rx  I discussed the assessment and treatment plan with the patient and daughter. The patient was provided an opportunity to ask questions and all were answered. The patient and daughter agreed with the plan and demonstrated an understanding of the instructions. Call for worsening symptoms for referral to derm for removal of cyst   The patient was advised to call back or seek an in-person evaluation if the symptoms worsen or if the condition fails to improve as anticipated.  I provided 5 minutes of non-face-to-face time during this encounter.  Keerstin Bjelland LEIGH Myelle Poteat, MD  [facility] 12-06-18

## 2019-01-04 ENCOUNTER — Telehealth: Payer: Self-pay | Admitting: Emergency Medicine

## 2019-01-04 NOTE — Telephone Encounter (Signed)
Spoke with pharmacy and they informed me that they did receive medication.

## 2019-01-04 NOTE — Telephone Encounter (Signed)
Pharmacy calling in to see if prescription can be sent in today if possible for an 11 o'clock delivery.

## 2019-01-08 ENCOUNTER — Other Ambulatory Visit: Payer: Self-pay

## 2019-01-08 ENCOUNTER — Telehealth (INDEPENDENT_AMBULATORY_CARE_PROVIDER_SITE_OTHER): Payer: Medicare Other | Admitting: Emergency Medicine

## 2019-01-08 ENCOUNTER — Encounter: Payer: Self-pay | Admitting: Emergency Medicine

## 2019-01-08 VITALS — Wt 135.0 lb

## 2019-01-08 DIAGNOSIS — G4700 Insomnia, unspecified: Secondary | ICD-10-CM | POA: Diagnosis not present

## 2019-01-08 DIAGNOSIS — I10 Essential (primary) hypertension: Secondary | ICD-10-CM

## 2019-01-08 DIAGNOSIS — R32 Unspecified urinary incontinence: Secondary | ICD-10-CM

## 2019-01-08 DIAGNOSIS — I679 Cerebrovascular disease, unspecified: Secondary | ICD-10-CM

## 2019-01-08 DIAGNOSIS — E039 Hypothyroidism, unspecified: Secondary | ICD-10-CM

## 2019-01-08 DIAGNOSIS — F0390 Unspecified dementia without behavioral disturbance: Secondary | ICD-10-CM

## 2019-01-08 MED ORDER — SERTRALINE HCL 100 MG PO TABS: 100.0000 mg | ORAL_TABLET | Freq: Every day | ORAL | 1 refills | Status: DC

## 2019-01-08 MED ORDER — QUETIAPINE FUMARATE 25 MG PO TABS: 25.0000 mg | ORAL_TABLET | Freq: Every day | ORAL | 1 refills | Status: DC

## 2019-01-08 MED ORDER — LEVOTHYROXINE SODIUM 88 MCG PO TABS: 88.0000 ug | ORAL_TABLET | Freq: Every day | ORAL | 1 refills | Status: DC

## 2019-01-08 MED ORDER — DONEPEZIL HCL 10 MG PO TABS: 10.0000 mg | ORAL_TABLET | Freq: Every day | ORAL | 1 refills | Status: DC

## 2019-01-08 MED ORDER — OXYBUTYNIN CHLORIDE ER 15 MG PO TB24: 15.0000 mg | ORAL_TABLET | Freq: Every day | ORAL | 1 refills | Status: DC

## 2019-01-08 NOTE — Progress Notes (Signed)
Telemedicine Encounter- SOAP NOTE Established Patient Attempted video communication without success.  This telephone encounter was conducted with the patient's (or proxy's) verbal consent via audio telecommunications: yes/no: Yes Patient was instructed to have this encounter in a suitably private space; and to only have persons present to whom they give permission to participate. In addition, patient identity was confirmed by use of name plus two identifiers (DOB and address).  I discussed the limitations, risks, security and privacy concerns of performing an evaluation and management service by telephone and the availability of in person appointments. I also discussed with the patient that there may be a patient responsible charge related to this service. The patient expressed understanding and agreed to proceed.  I spent a total of TIME; 0 MIN TO 60 MIN: 10 minutes talking with the patient or their proxy.  No chief complaint on file. Follow-up and medication refills  Subjective   Brianna Reynolds is a 85 y.o. female established patient.  Used to see Dr. Brigitte Pulse.  First visit with me.  Telephone visit today for follow-up and medication refills.  Spoke to both patient and daughter Brianna Reynolds.  Patient has no complaints.  Doing well.  No recent falls.  Requesting medication refills.  HPI   Patient Active Problem List   Diagnosis Date Noted  . Depression 12/22/2017  . Dementia ([place]) 12/22/2016  . Cerebrovascular small vessel disease 01/08/2015  . Idiopathic peripheral neuropathy 01/08/2015  . Atherosclerotic cerebrovascular disease 01/08/2015  . Recurrent Clostridium difficile diarrhea 06/13/2014  . Hypothyroid   . Insomnia 02/28/2013  . Abnormal serum protein electrophoresis 02/11/2013  . Abnormal EKG   . HTN (hypertension)     Past Medical History:  Diagnosis Date  . Abnormal EKG   . Anxiety   . C. difficile colitis   . Cataract   . Closed fracture of right olecranon  process 12/22/2017  . Fatigue   . HTN (hypertension)    New  . Hyperlipidemia   . Hypothyroid   . Memory loss   . Pneumonia     Current Outpatient Medications  Medication Sig Dispense Refill  . aspirin EC 81 MG tablet Take 81 mg by mouth daily.    . Calcium Carbonate (CALTRATE 600 PO) Take 1 tablet by mouth daily.    . Cholecalciferol (VITAMIN D-3) 1000 units CAPS Take 2,000 Units by mouth daily.    Marland Kitchen donepezil (ARICEPT) 10 MG tablet Take 1 tablet (10 mg total) by mouth at bedtime. 60 tablet 1  . erythromycin ophthalmic ointment Apply 1cm ribbon across lower lid twice a day 3.5 g 0  . levothyroxine (SYNTHROID) 88 MCG tablet Take 1 tablet (88 mcg total) by mouth daily before breakfast. 90 tablet 1  . Melatonin 5 MG TABS Take 5 mg by mouth at bedtime.     . Multiple Vitamin (MULTIVITAMIN) capsule Take 1 capsule by mouth daily.     . Multiple Vitamins-Minerals (PRESERVISION AREDS) CAPS Take 1 capsule by mouth daily.    Marland Kitchen oxybutynin (DITROPAN XL) 15 MG 24 hr tablet Take 1 tablet (15 mg total) by mouth at bedtime. 90 tablet 1  . QUEtiapine (SEROQUEL) 25 MG tablet Take 1 tablet (25 mg total) by mouth at bedtime. 90 tablet 1  . sertraline (ZOLOFT) 100 MG tablet Take 1 tablet (100 mg total) by mouth daily. 90 tablet 1  . acetaminophen (TYLENOL) 500 MG tablet Take 2 tablets (1,000 mg total) by mouth every 6 (six) hours. (Patient not taking: Reported  on 01/08/2019) 30 tablet 0  . polyethylene glycol (MIRALAX / GLYCOLAX) packet Take 17 g by mouth daily. (Patient not taking: Reported on 01/08/2019) 14 each 0  . traMADol (ULTRAM) 50 MG tablet Take 1 tablet (50 mg total) by mouth every 8 (eight) hours as needed for moderate pain or severe pain. (Patient not taking: Reported on 01/08/2019) 30 tablet 0   No current facility-administered medications for this visit.     Allergies  Allergen Reactions  . Penicillins Swelling and Rash    Has patient had a PCN reaction causing immediate rash,  facial/tongue/throat swelling, SOB or lightheadedness with hypotension: Yes Has patient had a PCN reaction causing severe rash involving mucus membranes or skin necrosis: Unk Has patient had a PCN reaction that required hospitalization: No Has patient had a PCN reaction occurring within the last 10 years: No If all of the above answers are "NO", then may proceed with Cephalosporin use.     Social History   Socioeconomic History  . Marital status: Married    Spouse name: Not on file  . Number of children: 5  . Years of education: Not on file  . Highest education level: Not on file  Occupational History  . Occupation: house wife  Social Needs  . Financial resource strain: Not on file  . Food insecurity:    Worry: Not on file    Inability: Not on file  . Transportation needs:    Medical: Not on file    Non-medical: Not on file  Tobacco Use  . Smoking status: Former Smoker    Types: Cigarettes    Last attempt to quit: 02/10/1991    Years since quitting: 27.9  . Smokeless tobacco: Never Used  Substance and Sexual Activity  . Alcohol use: No    Alcohol/week: 0.0 standard drinks    Comment: rarely  . Drug use: No  . Sexual activity: Not on file  Lifestyle  . Physical activity:    Days per week: Not on file    Minutes per session: Not on file  . Stress: Not on file  Relationships  . Social connections:    Talks on phone: Not on file    Gets together: Not on file    Attends religious service: Not on file    Active member of club or organization: Not on file    Attends meetings of clubs or organizations: Not on file    Relationship status: Not on file  . Intimate partner violence:    Fear of current or ex partner: Not on file    Emotionally abused: Not on file    Physically abused: Not on file    Forced sexual activity: Not on file  Other Topics Concern  . Not on file  Social History Narrative  . Not on file    Review of Systems  Constitutional: Negative.  Negative  for chills and fever.  HENT: Negative.  Negative for congestion and sore throat.   Eyes: Negative.   Respiratory: Positive for cough (Chronic).   Cardiovascular: Negative.  Negative for chest pain and palpitations.  Gastrointestinal: Negative for abdominal pain, diarrhea, nausea and vomiting.  Genitourinary: Negative.  Negative for dysuria and hematuria.  Skin: Negative.  Negative for rash.  Neurological: Negative for dizziness and headaches.    Objective   Vitals as reported by the patient: Today's Vitals   01/08/19 1025  Weight: 135 lb (61.2 kg)   Awake, in no distress.  Diagnoses and all orders  for this visit:  Insomnia -     QUEtiapine (SEROQUEL) 25 MG tablet; Take 1 tablet (25 mg total) by mouth at bedtime.  Urinary incontinence, unspecified type  Hypothyroidism, unspecified type  Dementia without behavioral disturbance, unspecified dementia type (HCC)  Cerebrovascular small vessel disease  Essential hypertension  Other orders -     donepezil (ARICEPT) 10 MG tablet; Take 1 tablet (10 mg total) by mouth at bedtime. -     levothyroxine (SYNTHROID) 88 MCG tablet; Take 1 tablet (88 mcg total) by mouth daily before breakfast. -     oxybutynin (DITROPAN XL) 15 MG 24 hr tablet; Take 1 tablet (15 mg total) by mouth at bedtime. -     sertraline (ZOLOFT) 100 MG tablet; Take 1 tablet (100 mg total) by mouth daily.    Clinically stable.  No medical concerns identified during this visit.  Medications refilled. Office visit in 6 months.   I discussed the assessment and treatment plan with the patient. The patient was provided an opportunity to ask questions and all were answered. The patient agreed with the plan and demonstrated an understanding of the instructions.   The patient was advised to call back or seek an in-person evaluation if the symptoms worsen or if the condition fails to improve as anticipated.  I provided 10 minutes of non-face-to-face time during this  encounter.  Georgina QuintMiguel Jose Carmine Carrozza, MD  [facility]

## 2019-01-08 NOTE — Progress Notes (Signed)
Called patient to triage for appointment. I spoke to patient's daughter Brianna Reynolds, HIPPA form has not been update since 2015. The patient former doctor was Dr Brigitte Pulse. Patient's daughter states this is a 6 month follow up for medication refill. Patient's daughter stated her mother has a cough nonproductive, but the cough has been for years.

## 2019-04-29 ENCOUNTER — Telehealth: Payer: Self-pay | Admitting: *Deleted

## 2019-04-29 DIAGNOSIS — Z1383 Encounter for screening for respiratory disorder NEC: Secondary | ICD-10-CM | POA: Diagnosis not present

## 2019-04-29 DIAGNOSIS — Z20828 Contact with and (suspected) exposure to other viral communicable diseases: Secondary | ICD-10-CM | POA: Diagnosis not present

## 2019-04-30 ENCOUNTER — Other Ambulatory Visit: Payer: Self-pay

## 2019-04-30 ENCOUNTER — Ambulatory Visit (INDEPENDENT_AMBULATORY_CARE_PROVIDER_SITE_OTHER): Payer: Medicare Other | Admitting: Emergency Medicine

## 2019-04-30 ENCOUNTER — Encounter: Payer: Self-pay | Admitting: Emergency Medicine

## 2019-04-30 VITALS — BP 116/73 | HR 94 | Temp 98.6°F | Resp 16 | Ht 59.0 in | Wt 125.6 lb

## 2019-04-30 DIAGNOSIS — R32 Unspecified urinary incontinence: Secondary | ICD-10-CM

## 2019-04-30 DIAGNOSIS — E039 Hypothyroidism, unspecified: Secondary | ICD-10-CM

## 2019-04-30 DIAGNOSIS — I679 Cerebrovascular disease, unspecified: Secondary | ICD-10-CM | POA: Diagnosis not present

## 2019-04-30 DIAGNOSIS — G4700 Insomnia, unspecified: Secondary | ICD-10-CM

## 2019-04-30 DIAGNOSIS — A0471 Enterocolitis due to Clostridium difficile, recurrent: Secondary | ICD-10-CM | POA: Diagnosis not present

## 2019-04-30 DIAGNOSIS — I10 Essential (primary) hypertension: Secondary | ICD-10-CM | POA: Diagnosis not present

## 2019-04-30 DIAGNOSIS — Z23 Encounter for immunization: Secondary | ICD-10-CM | POA: Diagnosis not present

## 2019-04-30 DIAGNOSIS — F0150 Vascular dementia without behavioral disturbance: Secondary | ICD-10-CM

## 2019-04-30 DIAGNOSIS — Z111 Encounter for screening for respiratory tuberculosis: Secondary | ICD-10-CM | POA: Diagnosis not present

## 2019-04-30 NOTE — Patient Instructions (Addendum)
Adult care home FL2 form filled out.   If you have lab work done today you will be contacted with your lab results within the next 2 weeks.  If you have not heard from [place] then please contact us. The fastest way to get your results is to register for My Chart.   IF you received an x-ray today, you will receive an invoice from [facility]. Please contact [facility] at [phone] with questions or concerns regarding your invoice.   IF you received labwork today, you will receive an invoice from [facility]. Please contact [facility] at [phone] with questions or concerns regarding your invoice.   Our billing staff will not be able to assist you with questions regarding bills from these companies.  You will be contacted with the lab results as soon as they are available. The fastest way to get your results is to activate your My Chart account. Instructions are located on the last page of this paperwork. If you have not heard from [place] regarding the results in 2 weeks, please contact this office.     Health Maintenance After Age 86 After age 18, you are at a higher risk for certain long-term diseases and infections as well as injuries from falls. Falls are a major cause of broken bones and head injuries in people who are older than age 55. Getting regular preventive care can help to keep you healthy and well. Preventive care includes getting regular testing and making lifestyle changes as recommended by your health care provider. Talk with your health care provider about:  Which screenings and tests you should have. A screening is a test that checks for a disease when you have no symptoms.  A diet and exercise plan that is right for you. What should I know about screenings and tests to prevent falls? Screening and testing are the best ways to find a health problem early. Early diagnosis and treatment give you the best chance of managing medical conditions that are common after age  87. Certain conditions and lifestyle choices may make you more likely to have a fall. Your health care provider may recommend:  Regular vision checks. Poor vision and conditions such as cataracts can make you more likely to have a fall. If you wear glasses, make sure to get your prescription updated if your vision changes.  Medicine review. Work with your health care provider to regularly review all of the medicines you are taking, including over-the-counter medicines. Ask your health care provider about any side effects that may make you more likely to have a fall. Tell your health care provider if any medicines that you take make you feel dizzy or sleepy.  Osteoporosis screening. Osteoporosis is a condition that causes the bones to get weaker. This can make the bones weak and cause them to break more easily.  Blood pressure screening. Blood pressure changes and medicines to control blood pressure can make you feel dizzy.  Strength and balance checks. Your health care provider may recommend certain tests to check your strength and balance while standing, walking, or changing positions.  Foot health exam. Foot pain and numbness, as well as not wearing proper footwear, can make you more likely to have a fall.  Depression screening. You may be more likely to have a fall if you have a fear of falling, feel emotionally low, or feel unable to do activities that you used to do.  Alcohol use screening. Using too much alcohol can affect your balance and  may make you more likely to have a fall. What actions can I take to lower my risk of falls? General instructions  Talk with your health care provider about your risks for falling. Tell your health care provider if: ? You fall. Be sure to tell your health care provider about all falls, even ones that seem minor. ? You feel dizzy, sleepy, or off-balance.  Take over-the-counter and prescription medicines only as told by your health care provider. These  include any supplements.  Eat a healthy diet and maintain a healthy weight. A healthy diet includes low-fat dairy products, low-fat (lean) meats, and fiber from whole grains, beans, and lots of fruits and vegetables. Home safety  Remove any tripping hazards, such as rugs, cords, and clutter.  Install safety equipment such as grab bars in bathrooms and safety rails on stairs.  Keep rooms and walkways well-lit. Activity   Follow a regular exercise program to stay fit. This will help you maintain your balance. Ask your health care provider what types of exercise are appropriate for you.  If you need a cane or walker, use it as recommended by your health care provider.  Wear supportive shoes that have nonskid soles. Lifestyle  Do not drink alcohol if your health care provider tells you not to drink.  If you drink alcohol, limit how much you have: ? 0-1 drink a day for women. ? 0-2 drinks a day for men.  Be aware of how much alcohol is in your drink. In the [place] one drink equals one typical bottle of beer (12 oz), one-half glass of wine (5 oz), or one shot of hard liquor (1 oz).  Do not use any products that contain nicotine or tobacco, such as cigarettes and e-cigarettes. If you need help quitting, ask your health care provider. Summary  Having a healthy lifestyle and getting preventive care can help to protect your health and wellness after age 69.  Screening and testing are the best way to find a health problem early and help you avoid having a fall. Early diagnosis and treatment give you the best chance for managing medical conditions that are more common for people who are older than age 36.  Falls are a major cause of broken bones and head injuries in people who are older than age 69. Take precautions to prevent a fall at home.  Work with your health care provider to learn what changes you can make to improve your health and wellness and to prevent falls. This information is  not intended to replace advice given to you by your health care provider. Make sure you discuss any questions you have with your health care provider. Document Released: 05/31/2017 Document Revised: 11/08/2018 Document Reviewed: 05/31/2017 Elsevier Patient Education  2020 [employer].

## 2019-04-30 NOTE — Progress Notes (Signed)
Brianna Reynolds 85 y.o.   Chief Complaint  Patient presents with  . Annual Exam    to complete FL2 form Nursing Facility    HISTORY OF PRESENT ILLNESS: This is a 85 y.o. female brought in by daughter in need of evaluation for entry into nursing facility.  Needs FL2 form filled out. Patient has history of dementia, Atherosclerotic cerebrovascular disease, hypothyroidism, hypertension, and recurrent C. difficile diarrhea. No complaints or medical concerns today.  Clinically stable.  HPI   Prior to Admission medications   Medication Sig Start Date End Date Taking? Authorizing Provider  aspirin EC 81 MG tablet Take 81 mg by mouth daily.   Yes [provider]  Calcium Carbonate (CALTRATE 600 PO) Take 1 tablet by mouth daily.   Yes [provider]  Cholecalciferol (VITAMIN D-3) 1000 units CAPS Take 2,000 Units by mouth daily.   Yes [provider]  Melatonin 5 MG TABS Take 5 mg by mouth at bedtime.    Yes [provider]  Multiple Vitamin (MULTIVITAMIN) capsule Take 1 capsule by mouth daily.    Yes [provider]  Multiple Vitamins-Minerals (PRESERVISION AREDS) CAPS Take 1 capsule by mouth daily.   Yes [provider]  oxybutynin (DITROPAN XL) 15 MG 24 hr tablet Take 1 tablet (15 mg total) by mouth at bedtime. 01/08/19  Yes Jayshun Galentine, Eilleen Kempf, MD  polyethylene glycol [facility] / GLYCOLAX) packet Take 17 g by mouth daily. 12/26/17  Yes Alwyn Ren, MD  acetaminophen (TYLENOL) 500 MG tablet Take 2 tablets (1,000 mg total) by mouth every 6 (six) hours. Patient not taking: Reported on 04/30/2019 12/24/17   Montez Morita, PA-C  donepezil (ARICEPT) 10 MG tablet Take 1 tablet (10 mg total) by mouth at bedtime. 01/08/19 04/08/19  Georgina Quint, MD  erythromycin ophthalmic ointment Apply 1cm ribbon across lower lid twice a day Patient not taking: Reported on 04/30/2019 12/06/18   Wandra Feinstein, MD  levothyroxine (SYNTHROID) 88 MCG tablet  Take 1 tablet (88 mcg total) by mouth daily before breakfast. 01/08/19 04/08/19  Georgina Quint, MD  QUEtiapine (SEROQUEL) 25 MG tablet Take 1 tablet (25 mg total) by mouth at bedtime. 01/08/19 04/08/19  Georgina Quint, MD  sertraline (ZOLOFT) 100 MG tablet Take 1 tablet (100 mg total) by mouth daily. 01/08/19 04/08/19  Georgina Quint, MD  traMADol (ULTRAM) 50 MG tablet Take 1 tablet (50 mg total) by mouth every 8 (eight) hours as needed for moderate pain or severe pain. Patient not taking: Reported on 04/30/2019 12/24/17   Montez Morita, PA-C    Allergies  Allergen Reactions  . Penicillins Swelling and Rash    Has patient had a PCN reaction causing immediate rash, facial/tongue/throat swelling, SOB or lightheadedness with hypotension: Yes Has patient had a PCN reaction causing severe rash involving mucus membranes or skin necrosis: Unk Has patient had a PCN reaction that required hospitalization: No Has patient had a PCN reaction occurring within the last 10 years: No If all of the above answers are "NO", then may proceed with Cephalosporin use.     Patient Active Problem List   Diagnosis Date Noted  . Depression 12/22/2017  . Dementia (HCC) 12/22/2016  . Cerebrovascular small vessel disease 01/08/2015  . Idiopathic peripheral neuropathy 01/08/2015  . Atherosclerotic cerebrovascular disease 01/08/2015  . Recurrent Clostridium difficile diarrhea 06/13/2014  . Hypothyroid   . Insomnia 02/28/2013  . Abnormal serum protein electrophoresis 02/11/2013  . Abnormal EKG   .  HTN (hypertension)     Past Medical History:  Diagnosis Date  . Abnormal EKG   . Anxiety   . C. difficile colitis   . Cataract   . Closed fracture of right olecranon process 12/22/2017  . Fatigue   . HTN (hypertension)    New  . Hyperlipidemia   . Hypothyroid   . Memory loss   . Pneumonia     Past Surgical History:  Procedure Laterality Date  . BREAST SURGERY Bilateral   . COLONOSCOPY    .  COLONOSCOPY N/A 10/27/2014   Procedure: COLONOSCOPY;  Surgeon: Iva Boop, MD;  Location: [facility] ENDOSCOPY;  Service: Endoscopy;  Laterality: N/A;  . FECAL TRANSPLANT N/A 10/27/2014  . INTRAMEDULLARY (IM) NAIL INTERTROCHANTERIC Right 12/23/2016   Procedure: RIGHT INTRAMEDULLARY (IM) NAIL INTERTROCHANTRIC;  Surgeon: Samson Frederic, MD;  Location: [facility] OR;  Service: Orthopedics;  Laterality: Right;  . ORIF ELBOW FRACTURE Right 12/23/2017   Procedure: OPEN REDUCTION INTERNAL FIXATION (ORIF) ELBOW/OLECRANON FRACTURE;  Surgeon: Myrene Galas, MD;  Location: [facility] OR;  Service: Orthopedics;  Laterality: Right;  . PARATHYROIDECTOMY    . THYROIDECTOMY, PARTIAL    . TONSILLECTOMY      Social History   Socioeconomic History  . Marital status: Married    Spouse name: Not on file  . Number of children: 5  . Years of education: Not on file  . Highest education level: Not on file  Occupational History  . Occupation: house wife  Social Needs  . Financial resource strain: Not on file  . Food insecurity    Worry: Not on file    Inability: Not on file  . Transportation needs    Medical: Not on file    Non-medical: Not on file  Tobacco Use  . Smoking status: Former Smoker    Types: Cigarettes    Quit date: 02/10/1991    Years since quitting: 28.2  . Smokeless tobacco: Never Used  Substance and Sexual Activity  . Alcohol use: No    Alcohol/week: 0.0 standard drinks    Comment: rarely  . Drug use: No  . Sexual activity: Not on file  Lifestyle  . Physical activity    Days per week: Not on file    Minutes per session: Not on file  . Stress: Not on file  Relationships  . Social Musician on phone: Not on file    Gets together: Not on file    Attends religious service: Not on file    Active member of club or organization: Not on file    Attends meetings of clubs or organizations: Not on file    Relationship status: Not on file  . Intimate partner violence    Fear of current or ex  partner: Not on file    Emotionally abused: Not on file    Physically abused: Not on file    Forced sexual activity: Not on file  Other Topics Concern  . Not on file  Social History Narrative  . Not on file    Family History  Problem Relation Age of Onset  . Arthritis Mother      Review of Systems  Constitutional: Negative.  Negative for chills and fever.  HENT: Negative.  Negative for congestion and sore throat.   Eyes: Negative.   Respiratory: Negative.  Negative for cough and shortness of breath.   Cardiovascular: Negative.  Negative for chest pain and palpitations.  Gastrointestinal: Negative for abdominal pain, nausea and vomiting.  Genitourinary: Negative.  Negative for dysuria and hematuria.  Skin: Negative.  Negative for rash.  Neurological: Negative.  Negative for dizziness and headaches.  Endo/Heme/Allergies: Negative.   All other systems reviewed and are negative.  Vitals:   04/30/19 0928  BP: 116/73  Pulse: 94  Resp: 16  Temp: 98.6 F (37 C)  SpO2: 96%     Physical Exam Vitals signs reviewed.  Constitutional:      Appearance: Normal appearance.  HENT:     Head: Normocephalic.  Eyes:     Extraocular Movements: Extraocular movements intact.     Pupils: Pupils are equal, round, and reactive to light.  Cardiovascular:     Rate and Rhythm: Normal rate and regular rhythm.     Pulses: Normal pulses.     Heart sounds: Normal heart sounds.  Pulmonary:     Effort: Pulmonary effort is normal.     Breath sounds: Normal breath sounds.  Lymphadenopathy:     Cervical: No cervical adenopathy.  Skin:    General: Skin is warm and dry.     Capillary Refill: Capillary refill takes less than 2 seconds.  Neurological:     General: No focal deficit present.     Mental Status: She is alert. Mental status is at baseline.  Psychiatric:        Behavior: Behavior normal.      ASSESSMENT & PLAN: Jalysa was seen today for annual exam.  Diagnoses and all orders  for this visit:  Vascular dementia without behavioral disturbance (HCC)  Need for prophylactic vaccination against Streptococcus pneumoniae (pneumococcus) -     Pneumococcal polysaccharide vaccine 23-valent greater than or equal to 2yo subcutaneous/IM  Need for prophylactic vaccination and inoculation against influenza -     Flu Vaccine QUAD High Dose(Fluad)  PPD screening test -     TB Skin Test  Insomnia, unspecified type  Urinary incontinence, unspecified type  Hypothyroidism, unspecified type  Essential hypertension  Recurrent Clostridium difficile diarrhea  Cerebrovascular small vessel disease    Patient Instructions   Adult care home FL2 form filled out.   If you have lab work done today you will be contacted with your lab results within the next 2 weeks.  If you have not heard from [place] then please contact us. The fastest way to get your results is to register for My Chart.   IF you received an x-ray today, you will receive an invoice from [facility]. Please contact [facility] at [phone] with questions or concerns regarding your invoice.   IF you received labwork today, you will receive an invoice from [facility]. Please contact [facility] at [phone] with questions or concerns regarding your invoice.   Our billing staff will not be able to assist you with questions regarding bills from these companies.  You will be contacted with the lab results as soon as they are available. The fastest way to get your results is to activate your My Chart account. Instructions are located on the last page of this paperwork. If you have not heard from [place] regarding the results in 2 weeks, please contact this office.     Health Maintenance After Age 72 After age 58, you are at a higher risk for certain long-term diseases and infections as well as injuries from falls. Falls are a major cause of broken bones and head injuries in people who are older than age  29. Getting regular preventive care can help to keep you healthy and well. Preventive care  includes getting regular testing and making lifestyle changes as recommended by your health care provider. Talk with your health care provider about:  Which screenings and tests you should have. A screening is a test that checks for a disease when you have no symptoms.  A diet and exercise plan that is right for you. What should I know about screenings and tests to prevent falls? Screening and testing are the best ways to find a health problem early. Early diagnosis and treatment give you the best chance of managing medical conditions that are common after age 29. Certain conditions and lifestyle choices may make you more likely to have a fall. Your health care provider may recommend:  Regular vision checks. Poor vision and conditions such as cataracts can make you more likely to have a fall. If you wear glasses, make sure to get your prescription updated if your vision changes.  Medicine review. Work with your health care provider to regularly review all of the medicines you are taking, including over-the-counter medicines. Ask your health care provider about any side effects that may make you more likely to have a fall. Tell your health care provider if any medicines that you take make you feel dizzy or sleepy.  Osteoporosis screening. Osteoporosis is a condition that causes the bones to get weaker. This can make the bones weak and cause them to break more easily.  Blood pressure screening. Blood pressure changes and medicines to control blood pressure can make you feel dizzy.  Strength and balance checks. Your health care provider may recommend certain tests to check your strength and balance while standing, walking, or changing positions.  Foot health exam. Foot pain and numbness, as well as not wearing proper footwear, can make you more likely to have a fall.  Depression screening. You may be more  likely to have a fall if you have a fear of falling, feel emotionally low, or feel unable to do activities that you used to do.  Alcohol use screening. Using too much alcohol can affect your balance and may make you more likely to have a fall. What actions can I take to lower my risk of falls? General instructions  Talk with your health care provider about your risks for falling. Tell your health care provider if: ? You fall. Be sure to tell your health care provider about all falls, even ones that seem minor. ? You feel dizzy, sleepy, or off-balance.  Take over-the-counter and prescription medicines only as told by your health care provider. These include any supplements.  Eat a healthy diet and maintain a healthy weight. A healthy diet includes low-fat dairy products, low-fat (lean) meats, and fiber from whole grains, beans, and lots of fruits and vegetables. Home safety  Remove any tripping hazards, such as rugs, cords, and clutter.  Install safety equipment such as grab bars in bathrooms and safety rails on stairs.  Keep rooms and walkways well-lit. Activity   Follow a regular exercise program to stay fit. This will help you maintain your balance. Ask your health care provider what types of exercise are appropriate for you.  If you need a cane or walker, use it as recommended by your health care provider.  Wear supportive shoes that have nonskid soles. Lifestyle  Do not drink alcohol if your health care provider tells you not to drink.  If you drink alcohol, limit how much you have: ? 0-1 drink a day for women. ? 0-2 drinks a day for men.  Be aware of how much alcohol is in your drink. In the [place] one drink equals one typical bottle of beer (12 oz), one-half glass of wine (5 oz), or one shot of hard liquor (1 oz).  Do not use any products that contain nicotine or tobacco, such as cigarettes and e-cigarettes. If you need help quitting, ask your health care provider.  Summary  Having a healthy lifestyle and getting preventive care can help to protect your health and wellness after age 83.  Screening and testing are the best way to find a health problem early and help you avoid having a fall. Early diagnosis and treatment give you the best chance for managing medical conditions that are more common for people who are older than age 83.  Falls are a major cause of broken bones and head injuries in people who are older than age 83. Take precautions to prevent a fall at home.  Work with your health care provider to learn what changes you can make to improve your health and wellness and to prevent falls. This information is not intended to replace advice given to you by your health care provider. Make sure you discuss any questions you have with your health care provider. Document Released: 05/31/2017 Document Revised: 11/08/2018 Document Reviewed: 05/31/2017 Elsevier Patient Education  2020 Elsevier Inc.      Edwina BarthMiguel Tiandra Swoveland, MD [facility]

## 2019-05-01 ENCOUNTER — Telehealth: Payer: Self-pay | Admitting: *Deleted

## 2019-05-01 NOTE — Telephone Encounter (Signed)
Called Brianna Reynolds at [facility] [place] concerning forms to be completed and faxed back today. The receptionist stated she was on another call and will call back.

## 2019-05-02 ENCOUNTER — Other Ambulatory Visit: Payer: Self-pay

## 2019-05-02 ENCOUNTER — Ambulatory Visit (INDEPENDENT_AMBULATORY_CARE_PROVIDER_SITE_OTHER): Payer: Medicare Other | Admitting: Emergency Medicine

## 2019-05-02 ENCOUNTER — Telehealth: Payer: Self-pay | Admitting: *Deleted

## 2019-05-02 DIAGNOSIS — E038 Other specified hypothyroidism: Secondary | ICD-10-CM | POA: Diagnosis not present

## 2019-05-02 DIAGNOSIS — F320 Major depressive disorder, single episode, mild: Secondary | ICD-10-CM | POA: Diagnosis not present

## 2019-05-02 DIAGNOSIS — F22 Delusional disorders: Secondary | ICD-10-CM | POA: Diagnosis not present

## 2019-05-02 DIAGNOSIS — Z111 Encounter for screening for respiratory tuberculosis: Secondary | ICD-10-CM

## 2019-05-02 DIAGNOSIS — G478 Other sleep disorders: Secondary | ICD-10-CM | POA: Diagnosis not present

## 2019-05-02 DIAGNOSIS — F0280 Dementia in other diseases classified elsewhere without behavioral disturbance: Secondary | ICD-10-CM | POA: Diagnosis not present

## 2019-05-02 LAB — TB SKIN TEST
Induration: 0 mm
TB Skin Test: NEGATIVE

## 2019-05-02 NOTE — Progress Notes (Signed)
PPD read only

## 2019-05-02 NOTE — Telephone Encounter (Signed)
Faxed completed form to [facility] [place]: Brianna Reynolds, the PPD results with letter of negative for Turberculosis. Patient's daughter stated they will do the 2 step Tuberculosis test there. Confirmation page received 11:43 am. Copies were given to the daughter to hand deliver to facility and I faxed the original, then scan to patient chart.

## 2019-05-07 DIAGNOSIS — E039 Hypothyroidism, unspecified: Secondary | ICD-10-CM | POA: Diagnosis not present

## 2019-05-07 DIAGNOSIS — F0390 Unspecified dementia without behavioral disturbance: Secondary | ICD-10-CM | POA: Diagnosis not present

## 2019-05-07 DIAGNOSIS — I10 Essential (primary) hypertension: Secondary | ICD-10-CM | POA: Diagnosis not present

## 2019-05-07 NOTE — Telephone Encounter (Signed)
Schedule AWV.  

## 2019-05-09 DIAGNOSIS — G478 Other sleep disorders: Secondary | ICD-10-CM | POA: Diagnosis not present

## 2019-05-09 DIAGNOSIS — F0151 Vascular dementia with behavioral disturbance: Secondary | ICD-10-CM | POA: Diagnosis not present

## 2019-05-20 DIAGNOSIS — F0390 Unspecified dementia without behavioral disturbance: Secondary | ICD-10-CM | POA: Diagnosis not present

## 2019-05-20 DIAGNOSIS — I672 Cerebral atherosclerosis: Secondary | ICD-10-CM | POA: Diagnosis not present

## 2019-05-20 DIAGNOSIS — I10 Essential (primary) hypertension: Secondary | ICD-10-CM | POA: Diagnosis not present

## 2019-05-20 DIAGNOSIS — R1310 Dysphagia, unspecified: Secondary | ICD-10-CM | POA: Diagnosis not present

## 2019-05-20 DIAGNOSIS — R2689 Other abnormalities of gait and mobility: Secondary | ICD-10-CM | POA: Diagnosis not present

## 2019-05-21 ENCOUNTER — Other Ambulatory Visit: Payer: Self-pay | Admitting: Emergency Medicine

## 2019-05-21 DIAGNOSIS — G4700 Insomnia, unspecified: Secondary | ICD-10-CM

## 2019-05-21 NOTE — Telephone Encounter (Signed)
Requested medication (s) are due for refill today: yes  Requested medication (s) are on the active medication list: yes  Last refill: 05/01/19  Future visit scheduled: no  Notes to clinic:  Review for refill and qty   Requested Prescriptions  Pending Prescriptions Disp Refills   oxybutynin (DITROPAN XL) 15 MG 24 hr tablet [Pharmacy Med Name: OXYBUTYNIN CL ER 15 MG TABLET] 30 tablet 0    Sig: TAKE 1 TABLET BY MOUTH AT BEDTIME FOR OVERACTIVE BLADDER     Urology:  Bladder Agents Passed - 05/21/2019 11:05 AM      Passed - Valid encounter within last 12 months    Recent Outpatient Visits          2 weeks ago Encounter for PPD skin test reading   Primary Care at [facility], Ines Bloomer, MD   3 weeks ago Vascular dementia without behavioral disturbance [facility])   Primary Care at [facility], Opp, MD   4 months ago Insomnia   Primary Care at [place], [place], MD   5 months ago Inclusion cyst   Primary Care at [facility], Rex Kras, MD   1 year ago Acquired hypothyroidism   [facility] at [facility], Laurey Arrow, MD              QUEtiapine (SEROQUEL) 25 MG tablet [Pharmacy Med Name: QUETIAPINE FUMARATE 25 MG TAB] 30 tablet 0    Sig: TAKE 1 TABLET BY MOUTH AT BEDTIME FOR MOOD     Not Delegated - Psychiatry:  Antipsychotics - Second Generation (Atypical) - quetiapine Failed - 05/21/2019 11:05 AM      Failed - This refill cannot be delegated      Failed - ALT in normal range and within 180 days    ALT  Date Value Ref Range Status  02/15/2018 8 0 - 32 IU/L Final         Failed - AST in normal range and within 180 days    AST  Date Value Ref Range Status  02/15/2018 11 0 - 40 IU/L Final         Passed - Last BP in normal range    BP Readings from Last 1 Encounters:  04/30/19 116/73         Passed - Valid encounter within last 6 months    Recent Outpatient Visits          2 weeks ago Encounter for PPD skin test reading   Primary Care at [facility], Ines Bloomer, MD   3 weeks ago Vascular dementia without behavioral disturbance [facility])   Primary Care at [facility], Ines Bloomer, MD   4 months ago Insomnia   Primary Care at [facility], Ines Bloomer, MD   5 months ago Inclusion cyst   Primary Care at [facility], Rex Kras, MD   1 year ago Acquired hypothyroidism   Primary [facility] at [facility], Laurey Arrow, MD             Passed - Completed PHQ-2 or PHQ-9 in the last 360 days.       levothyroxine (SYNTHROID) 88 MCG tablet [Pharmacy Med Name: LEVOTHYROXINE 88 MCG TABLET] 8 tablet 0    Sig: TAKE 1 TABLET BY MOUTH EVERY MORNING BEFORE BREAKFAST FOR THYROID     Endocrinology:  Hypothyroid Agents Failed - 05/21/2019 11:05 AM      Failed - TSH needs to be rechecked within 3 months after an abnormal result. Refill  until TSH is due.      Failed - TSH in normal range and within 360 days    TSH  Date Value Ref Range Status  02/15/2018 0.300 (L) 0.450 - 4.500 uIU/mL Final         Passed - Valid encounter within last 12 months    Recent Outpatient Visits          2 weeks ago Encounter for PPD skin test reading   Primary Care at [facility], Eilleen Kempf, MD   3 weeks ago Vascular dementia without behavioral disturbance [facility])   Primary Care at [facility], Eilleen Kempf, MD   4 months ago Insomnia   Primary Care at [facility], Eilleen Kempf, MD   5 months ago Inclusion cyst   Primary Care at [facility], Minerva Fester, MD   1 year ago Acquired hypothyroidism   [facility] at [facility], Levell July, MD              donepezil (ARICEPT) 10 MG tablet [Pharmacy Med Name: DONEPEZIL HCL 10 MG TABLET] 30 tablet 0    Sig: TAKE 1 TABLET BY MOUTH AT BEDTIME FOR [facility]     Neurology:  Alzheimer's Agents Passed - 05/21/2019 11:05 AM      Passed - Valid encounter within last 6 months    Recent Outpatient Visits          2 weeks ago Encounter for PPD skin test reading   Primary Care at [facility], Eilleen Kempf, MD   3  weeks ago Vascular dementia without behavioral disturbance [facility])   Primary Care at [facility], Eilleen Kempf, MD   4 months ago Insomnia   Primary Care at [facility], Eilleen Kempf, MD   5 months ago Inclusion cyst   Primary Care at [facility], Minerva Fester, MD   1 year ago Acquired hypothyroidism   [facility] at [facility], Levell July, MD              sertraline (ZOLOFT) 100 MG tablet [Pharmacy Med Name: SERTRALINE HCL 100 MG TABLET] 30 tablet 0    Sig: TAKE 1 TABLET BY MOUTH EVERY DAY FOR DEPRESSION     Psychiatry:  Antidepressants - SSRI Passed - 05/21/2019 11:05 AM      Passed - Valid encounter within last 6 months    Recent Outpatient Visits          2 weeks ago Encounter for PPD skin test reading   Primary Care at [facility], Eilleen Kempf, MD   3 weeks ago Vascular dementia without behavioral disturbance [facility])   Primary Care at [facility], Eilleen Kempf, MD   4 months ago Insomnia   Primary Care at [facility], Eilleen Kempf, MD   5 months ago Inclusion cyst   Primary Care at [facility], Minerva Fester, MD   1 year ago Acquired hypothyroidism   [facility] at [facility], Levell July, MD             Passed - Completed PHQ-2 or PHQ-9 in the last 360 days.

## 2019-05-23 ENCOUNTER — Emergency Department (HOSPITAL_COMMUNITY): Payer: Medicare Other

## 2019-05-23 ENCOUNTER — Encounter (HOSPITAL_COMMUNITY): Payer: Self-pay | Admitting: Emergency Medicine

## 2019-05-23 ENCOUNTER — Emergency Department (HOSPITAL_COMMUNITY)
Admission: EM | Admit: 2019-05-23 | Discharge: 2019-05-23 | Disposition: A | Discharge status: Home / Self Care | Payer: Medicare Other | Attending: Emergency Medicine | Admitting: Emergency Medicine

## 2019-05-23 ENCOUNTER — Other Ambulatory Visit: Payer: Self-pay

## 2019-05-23 DIAGNOSIS — I10 Essential (primary) hypertension: Secondary | ICD-10-CM | POA: Diagnosis not present

## 2019-05-23 DIAGNOSIS — Y92129 Unspecified place in nursing home as the place of occurrence of the external cause: Secondary | ICD-10-CM | POA: Diagnosis not present

## 2019-05-23 DIAGNOSIS — F0390 Unspecified dementia without behavioral disturbance: Secondary | ICD-10-CM | POA: Diagnosis not present

## 2019-05-23 DIAGNOSIS — W19XXXA Unspecified fall, initial encounter: Secondary | ICD-10-CM

## 2019-05-23 DIAGNOSIS — Z7401 Bed confinement status: Secondary | ICD-10-CM | POA: Diagnosis not present

## 2019-05-23 DIAGNOSIS — M25551 Pain in right hip: Secondary | ICD-10-CM | POA: Diagnosis not present

## 2019-05-23 DIAGNOSIS — Y999 Unspecified external cause status: Secondary | ICD-10-CM | POA: Insufficient documentation

## 2019-05-23 DIAGNOSIS — Z7982 Long term (current) use of aspirin: Secondary | ICD-10-CM | POA: Diagnosis not present

## 2019-05-23 DIAGNOSIS — Z87891 Personal history of nicotine dependence: Secondary | ICD-10-CM | POA: Insufficient documentation

## 2019-05-23 DIAGNOSIS — Z79899 Other long term (current) drug therapy: Secondary | ICD-10-CM | POA: Diagnosis not present

## 2019-05-23 DIAGNOSIS — W010XXA Fall on same level from slipping, tripping and stumbling without subsequent striking against object, initial encounter: Secondary | ICD-10-CM | POA: Insufficient documentation

## 2019-05-23 DIAGNOSIS — S79911A Unspecified injury of right hip, initial encounter: Secondary | ICD-10-CM | POA: Diagnosis not present

## 2019-05-23 DIAGNOSIS — I959 Hypotension, unspecified: Secondary | ICD-10-CM | POA: Diagnosis not present

## 2019-05-23 DIAGNOSIS — E039 Hypothyroidism, unspecified: Secondary | ICD-10-CM | POA: Diagnosis not present

## 2019-05-23 DIAGNOSIS — S0990XA Unspecified injury of head, initial encounter: Secondary | ICD-10-CM | POA: Diagnosis not present

## 2019-05-23 DIAGNOSIS — Y9301 Activity, walking, marching and hiking: Secondary | ICD-10-CM | POA: Diagnosis not present

## 2019-05-23 DIAGNOSIS — I469 Cardiac arrest, cause unspecified: Secondary | ICD-10-CM | POA: Diagnosis not present

## 2019-05-23 DIAGNOSIS — M2550 Pain in unspecified joint: Secondary | ICD-10-CM | POA: Diagnosis not present

## 2019-05-23 DIAGNOSIS — R52 Pain, unspecified: Secondary | ICD-10-CM | POA: Diagnosis not present

## 2019-05-23 LAB — URINALYSIS, ROUTINE W REFLEX MICROSCOPIC
Bilirubin Urine: NEGATIVE
Glucose, UA: NEGATIVE mg/dL
Hgb urine dipstick: NEGATIVE
Ketones, ur: NEGATIVE mg/dL
Leukocytes,Ua: NEGATIVE
Nitrite: NEGATIVE
Protein, ur: NEGATIVE mg/dL
Specific Gravity, Urine: 1.023 (ref 1.005–1.030)
pH: 5 (ref 5.0–8.0)

## 2019-05-23 LAB — CBC WITH DIFFERENTIAL/PLATELET
Abs Immature Granulocytes: 0.03 10*3/uL (ref 0.00–0.07)
Basophils Absolute: 0.1 10*3/uL (ref 0.0–0.1)
Basophils Relative: 1 %
Eosinophils Absolute: 0.2 10*3/uL (ref 0.0–0.5)
Eosinophils Relative: 2 %
HCT: 41.5 % (ref 36.0–46.0)
Hemoglobin: 12.8 g/dL (ref 12.0–15.0)
Immature Granulocytes: 0 %
Lymphocytes Relative: 19 %
Lymphs Abs: 1.6 10*3/uL (ref 0.7–4.0)
MCH: 29 pg (ref 26.0–34.0)
MCHC: 30.8 g/dL (ref 30.0–36.0)
MCV: 94.1 fL (ref 80.0–100.0)
Monocytes Absolute: 0.8 10*3/uL (ref 0.1–1.0)
Monocytes Relative: 10 %
Neutro Abs: 5.9 10*3/uL (ref 1.7–7.7)
Neutrophils Relative %: 68 %
Platelets: 282 10*3/uL (ref 150–400)
RBC: 4.41 MIL/uL (ref 3.87–5.11)
RDW: 14 % (ref 11.5–15.5)
WBC: 8.6 10*3/uL (ref 4.0–10.5)
nRBC: 0 % (ref 0.0–0.2)

## 2019-05-23 LAB — BASIC METABOLIC PANEL
Anion gap: 9 (ref 5–15)
BUN: 15 mg/dL (ref 8–23)
CO2: 22 mmol/L (ref 22–32)
Calcium: 9.4 mg/dL (ref 8.9–10.3)
Chloride: 112 mmol/L — ABNORMAL HIGH (ref 98–111)
Creatinine, Ser: 0.75 mg/dL (ref 0.44–1.00)
GFR calc Af Amer: >60 mL/min (ref >60)
GFR calc non Af Amer: >60 mL/min (ref >60)
Glucose, Bld: 100 mg/dL — ABNORMAL HIGH (ref 70–99)
Potassium: 3.6 mmol/L (ref 3.5–5.1)
Sodium: 143 mmol/L (ref 135–145)

## 2019-05-23 NOTE — ED Notes (Signed)
Pt discharge instructions reviewed. Pt unable to sign. Pt discharged on stretcher via [facility].

## 2019-05-23 NOTE — ED Notes (Signed)
Urine culture send with sample 

## 2019-05-23 NOTE — ED Notes (Signed)
[facility] called for transport.  

## 2019-05-23 NOTE — ED Provider Notes (Addendum)
[facility] EMERGENCY DEPARTMENT Provider Note   CSN: [id number] Arrival date & time: 05/23/19  0402     History   Chief Complaint Chief Complaint  Patient presents with   Fall    R Hip Pain     HPI ZETHA KUHAR is a 85 y.o. female.     The history is provided by the patient, the EMS personnel, the nursing home and medical records. No language interpreter was used.  Fall     85 year old female brought here via EMS from a memory care facility for evaluation of a fall.  Patient has history of dementia therefore history is limited.  She did recall falling yesterday when she was walking.  States she thinks she may have tripped on something.  She denies any precipitating symptoms prior to the fall.  She recall landing on her right hip but denies hitting her head or loss of consciousness.  She does not report any significant pain to her right hip at this time.  She does not complain of any headache, neck pain, chest pain, trouble breathing, abdominal pain, back pain or pain to her extremities.  She is not on any blood thinner medication.  No complaints of numbness or weakness.  No urinary symptoms.  Past Medical History:  Diagnosis Date   Abnormal EKG    Anxiety    C. difficile colitis    Cataract    Closed fracture of right olecranon process 12/22/2017   Fatigue    HTN (hypertension)    New   Hyperlipidemia    Hypothyroid    Memory loss    Pneumonia     Patient Active Problem List   Diagnosis Date Noted   Depression 12/22/2017   Dementia ([place]) 12/22/2016   Cerebrovascular small vessel disease 01/08/2015   Idiopathic peripheral neuropathy 01/08/2015   Atherosclerotic cerebrovascular disease 01/08/2015   Recurrent Clostridium difficile diarrhea 06/13/2014   Hypothyroid    Insomnia 02/28/2013   Abnormal serum protein electrophoresis 02/11/2013   Abnormal EKG    HTN (hypertension)     Past Surgical History:  Procedure  Laterality Date   BREAST SURGERY Bilateral    COLONOSCOPY     COLONOSCOPY N/A 10/27/2014   Procedure: COLONOSCOPY;  Surgeon: Gatha Mayer, MD;  Location: [facility];  Service: Endoscopy;  Laterality: N/A;   FECAL TRANSPLANT N/A 10/27/2014   INTRAMEDULLARY (IM) NAIL INTERTROCHANTERIC Right 12/23/2016   Procedure: RIGHT INTRAMEDULLARY (IM) NAIL INTERTROCHANTRIC;  Surgeon: Rod Can, MD;  Location: [facility];  Service: Orthopedics;  Laterality: Right;   ORIF ELBOW FRACTURE Right 12/23/2017   Procedure: OPEN REDUCTION INTERNAL FIXATION (ORIF) ELBOW/OLECRANON FRACTURE;  Surgeon: Altamese Hector, MD;  Location: [facility];  Service: Orthopedics;  Laterality: Right;   PARATHYROIDECTOMY     THYROIDECTOMY, PARTIAL     TONSILLECTOMY       OB History   No obstetric history on file.      Home Medications    Prior to Admission medications   Medication Sig Start Date End Date Taking? Authorizing Provider  acetaminophen (TYLENOL) 500 MG tablet Take 2 tablets (1,000 mg total) by mouth every 6 (six) hours. Patient not taking: Reported on 04/30/2019 12/24/17   Ainsley Spinner, PA-C  aspirin EC 81 MG tablet Take 81 mg by mouth daily.    [provider]  Calcium Carbonate (CALTRATE 600 PO) Take 1 tablet by mouth daily.    [provider]  Cholecalciferol (VITAMIN D-3) 1000 units CAPS Take 2,000 Units  by mouth daily.    [provider]  donepezil (ARICEPT) 10 MG tablet Take 1 tablet (10 mg total) by mouth at bedtime. 01/08/19 04/08/19  Georgina QuintSagardia, Miguel Jose, MD  erythromycin ophthalmic ointment Apply 1cm ribbon across lower lid twice a day Patient not taking: Reported on 04/30/2019 12/06/18   Wandra Feinsteinorum, Lisa L, MD  levothyroxine (SYNTHROID) 88 MCG tablet Take 1 tablet (88 mcg total) by mouth daily before breakfast. 01/08/19 04/08/19  Georgina QuintSagardia, Miguel Jose, MD  Melatonin 5 MG TABS Take 5 mg by mouth at bedtime.     [provider]  Multiple Vitamin (MULTIVITAMIN) capsule Take 1  capsule by mouth daily.     [provider]  Multiple Vitamins-Minerals (PRESERVISION AREDS) CAPS Take 1 capsule by mouth daily.    [provider]  oxybutynin (DITROPAN XL) 15 MG 24 hr tablet Take 1 tablet (15 mg total) by mouth at bedtime. 01/08/19   Georgina QuintSagardia, Miguel Jose, MD  polyethylene glycol [facility]MIRALAX / Ethelene HalGLYCOLAX) packet Take 17 g by mouth daily. 12/26/17   Alwyn RenMathews, Elizabeth G, MD  QUEtiapine (SEROQUEL) 25 MG tablet Take 1 tablet (25 mg total) by mouth at bedtime. 01/08/19 04/08/19  Georgina QuintSagardia, Miguel Jose, MD  sertraline (ZOLOFT) 100 MG tablet Take 1 tablet (100 mg total) by mouth daily. 01/08/19 04/08/19  Georgina QuintSagardia, Miguel Jose, MD  traMADol (ULTRAM) 50 MG tablet Take 1 tablet (50 mg total) by mouth every 8 (eight) hours as needed for moderate pain or severe pain. Patient not taking: Reported on 04/30/2019 12/24/17   Montez MoritaPaul, Keith, PA-C    Family History Family History  Problem Relation Age of Onset   Arthritis Mother     Social History Social History   Tobacco Use   Smoking status: Former Smoker    Types: Cigarettes    Quit date: 02/10/1991    Years since quitting: 28.2   Smokeless tobacco: Never Used  Substance Use Topics   Alcohol use: No    Alcohol/week: 0.0 standard drinks    Comment: rarely   Drug use: No     Allergies   Penicillins   Review of Systems Review of Systems  All other systems reviewed and are negative.    Physical Exam Updated Vital Signs BP (!) 156/73 (BP Location: Left Arm)    Pulse 70    Temp 98.1 F (36.7 C) (Oral)    Resp 20    SpO2 98%   Physical Exam Vitals signs and nursing note reviewed.  Constitutional:      General: She is not in acute distress.    Appearance: She is well-developed.  HENT:     Head: Atraumatic.  Eyes:     Conjunctiva/sclera: Conjunctivae normal.  Neck:     Musculoskeletal: Neck supple.  Cardiovascular:     Rate and Rhythm: Normal rate and regular rhythm.  Pulmonary:     Effort: Pulmonary effort is  normal.     Breath sounds: Rhonchi present.  Abdominal:     Palpations: Abdomen is soft.     Tenderness: There is no abdominal tenderness.  Musculoskeletal:        General: Tenderness (Right hip: Very mild tenderness to lateral hip with normal hip flexion extension abduction and abduction and no shortening of the leg.  No overlying skin changes.) present.  Skin:    Findings: No rash.  Neurological:     Mental Status: She is alert. Mental status is at baseline.  Psychiatric:        Mood and  Affect: Mood normal.      ED Treatments / Results  Labs (all labs ordered are listed, but only abnormal results are displayed) Labs Reviewed  BASIC METABOLIC PANEL - Abnormal; Notable for the following components:      Result Value   Chloride 112 (*)    Glucose, Bld 100 (*)    All other components within normal limits  URINALYSIS, ROUTINE W REFLEX MICROSCOPIC - Abnormal; Notable for the following components:   APPearance HAZY (*)    All other components within normal limits  CBC WITH DIFFERENTIAL/PLATELET    EKG None  Radiology Ct Head Wo Contrast  Result Date: 05/23/2019 CLINICAL DATA:  Unwitnessed fall, head trauma EXAM: CT HEAD WITHOUT CONTRAST TECHNIQUE: Contiguous axial images were obtained from the base of the skull through the vertex without intravenous contrast. COMPARISON:  12/11/2014 FINDINGS: Brain: There is atrophy and chronic small vessel disease changes. No acute intracranial abnormality. Specifically, no hemorrhage, hydrocephalus, mass lesion, acute infarction, or significant intracranial injury. Vascular: No hyperdense vessel or unexpected calcification. Skull: No acute calvarial abnormality. Sinuses/Orbits: Visualized paranasal sinuses and mastoids clear. Orbital soft tissues unremarkable. Other: None IMPRESSION: Atrophy, chronic microvascular disease. No acute intracranial abnormality. Electronically Signed   By: Charlett Nose M.D.   On: 05/23/2019 10:26   Dg Hip Unilat W  Or Wo Pelvis 2-3 Views Right  Result Date: 05/23/2019 CLINICAL DATA:  Post fall with right hip pain. EXAM: DG HIP (WITH OR WITHOUT PELVIS) 2-3V RIGHT COMPARISON:  12/23/2016 FINDINGS: Intramedullary rod and trans trochanteric screw fixation of remote intertrochanteric femur fracture. The hardware is intact. The fracture is healed. No acute fracture of the right hip. Pubic rami are intact. Pubic symphysis and sacroiliac joints are congruent. Degenerative change of the right sacroiliac joint and right hip. IMPRESSION: 1. No acute fracture of the right hip. 2. Healed right intertrochanteric femur fracture with hardware in place. Electronically Signed   By: Narda Rutherford M.D.   On: 05/23/2019 04:47    Procedures Procedures (including critical care time)  Medications Ordered in ED Medications - No data to display   Initial Impression / Assessment and Plan / ED Course  I have reviewed the triage vital signs and the nursing notes.  Pertinent labs & imaging results that were available during my care of the patient were reviewed by me and considered in my medical decision making (see chart for details).        BP (!) 140/46 (BP Location: Left Arm)    Pulse 67    Temp 97.8 F (36.6 C) (Oral)    Resp 18    SpO2 98%    Final Clinical Impressions(s) / ED Diagnoses   Final diagnoses:  Fall at nursing home, initial encounter    ED Discharge Orders    None     7:03 AM This is a demented elderly female lives in a memory care who had an unwitnessed fall last night when the staff found her on the floor next to her bed.  She apparently went to use the bathroom and was on her way back according to the staff.  Her only complaint is right hip pain.  On exam, she has minimal tenderness to her right hip and no other signs of injury.  She is able to answer question appropriately and appears to be in no acute discomfort.  Initial x-ray of the right hip without any acute fracture or dislocation.  Her  labs are reassuring.  Will check UA.  Plan to reach out to family member Chyrl Civatte [phone]) for an update once UA has resulted. I did reached out to [facility] [phone] for history.   9:08 AM UA without signs of urinary tract infection.  Patient ambulate without difficulty.  Initially it was noted that patient was hypoxic however she is in no acute respiratory discomfort, no hypoxia noted in the room, I have reached out to give family member and update as well as the staff at her memory care unit.  Patient is stable for discharge.  9:52 AM As [facility] arrive to pick pt up, she left her bed to walk and had a witness fall when she fell backward.  She did not hit her head and denies headache.  Report mild R elbow pain. No reproducible pain to R elbow or R arm.  Will obtain head CT scan.    10:41 AM Head CT scan unremarkable.  Pt request to be discharge.  No other injury noted.     Fayrene Helper, PA-C 05/23/19 0912    Fayrene Helper, PA-C 05/23/19 1041    Zadie Rhine, MD 05/23/19 2306

## 2019-05-23 NOTE — ED Provider Notes (Signed)
Patient seen/examined in the Emergency Department in conjunction with Advanced Practice Provider Rona Ravens Patient presents after fall from nursing facility Denies head injury Reports right leg pain Exam : awake/alert, no distress, no signs of head injury Full ROM of both legs without difficulty.  Pelvis stable Plan: will need to ambulate patient.   Likely discharge back to facility     Ripley Fraise, MD 05/23/19 [phone]

## 2019-05-23 NOTE — ED Notes (Signed)
Patient transported to CT 

## 2019-05-23 NOTE — Discharge Instructions (Addendum)
You have been evaluated for your unwitnessed fall this morning.  Fortunately your x-ray of your right hip is without any acute finding.  Your urine did not show any signs of urinary tract infection.  Your labs reassuring.  Please follow-up with your primary care provider as needed.  [birth year] your [birth year], happy birthday.

## 2019-05-23 NOTE — ED Triage Notes (Addendum)
Patient arrived with EMS from [facility] facility , lost her balance and fell this morning , no LOC , reports right hip pain , no anticoagulant medication .

## 2019-05-23 NOTE — ED Notes (Signed)
Pt ambulated with assistance in hallway approx. 30 ft. with no issue. Pt SpO2 did decrease to 87% RA upon ambulation. Upon getting back in hospital bed pt SpO2 increased back into lower 90s.

## 2019-05-23 NOTE — ED Notes (Signed)
This RN was walking to this pt room. The RN witnessed pt have a ground level fall. Pt was walking in room and pt slipped and fell onto her back. Pt did not hit head upon fall. Pt assisted back into hospital bed via 2 person assist. EDP notified.   EDP assessed pt post fall. No apparent injuries and pt not complaining of any pain. EDP to order CT scan of pt head to rule out injury.

## 2019-06-19 DIAGNOSIS — I10 Essential (primary) hypertension: Secondary | ICD-10-CM | POA: Diagnosis not present

## 2019-06-19 DIAGNOSIS — R1310 Dysphagia, unspecified: Secondary | ICD-10-CM | POA: Diagnosis not present

## 2019-06-19 DIAGNOSIS — F0390 Unspecified dementia without behavioral disturbance: Secondary | ICD-10-CM | POA: Diagnosis not present

## 2019-06-19 DIAGNOSIS — R2689 Other abnormalities of gait and mobility: Secondary | ICD-10-CM | POA: Diagnosis not present

## 2019-06-19 DIAGNOSIS — I672 Cerebral atherosclerosis: Secondary | ICD-10-CM | POA: Diagnosis not present

## 2019-07-01 DIAGNOSIS — R05 Cough: Secondary | ICD-10-CM | POA: Diagnosis not present

## 2019-07-01 DIAGNOSIS — Z20828 Contact with and (suspected) exposure to other viral communicable diseases: Secondary | ICD-10-CM | POA: Diagnosis not present

## 2019-07-01 DIAGNOSIS — R1310 Dysphagia, unspecified: Secondary | ICD-10-CM | POA: Diagnosis not present

## 2019-07-01 DIAGNOSIS — R2689 Other abnormalities of gait and mobility: Secondary | ICD-10-CM | POA: Diagnosis not present

## 2019-07-01 DIAGNOSIS — I10 Essential (primary) hypertension: Secondary | ICD-10-CM | POA: Diagnosis not present

## 2019-07-01 DIAGNOSIS — F0390 Unspecified dementia without behavioral disturbance: Secondary | ICD-10-CM | POA: Diagnosis not present

## 2019-07-01 DIAGNOSIS — I672 Cerebral atherosclerosis: Secondary | ICD-10-CM | POA: Diagnosis not present

## 2019-07-01 DIAGNOSIS — Z1383 Encounter for screening for respiratory disorder NEC: Secondary | ICD-10-CM | POA: Diagnosis not present

## 2019-07-04 DIAGNOSIS — R062 Wheezing: Secondary | ICD-10-CM | POA: Diagnosis not present

## 2019-07-04 DIAGNOSIS — J158 Pneumonia due to other specified bacteria: Secondary | ICD-10-CM | POA: Diagnosis not present

## 2019-07-04 DIAGNOSIS — R0989 Other specified symptoms and signs involving the circulatory and respiratory systems: Secondary | ICD-10-CM | POA: Diagnosis not present

## 2019-07-04 DIAGNOSIS — F0151 Vascular dementia with behavioral disturbance: Secondary | ICD-10-CM | POA: Diagnosis not present

## 2019-08-28 ENCOUNTER — Other Ambulatory Visit: Payer: Self-pay | Admitting: Emergency Medicine

## 2019-08-28 ENCOUNTER — Telehealth: Payer: Self-pay | Admitting: *Deleted

## 2019-08-28 NOTE — Telephone Encounter (Signed)
Entered in error

## 2019-08-28 NOTE — Telephone Encounter (Signed)
Faxed signed Medication Administration Record to [employer]. Confirmation page 9:15 am.

## 2019-09-30 DEATH — deceased
